# Patient Record
Sex: Female | Born: 1950 | Race: Black or African American | Hispanic: No
Health system: Southern US, Community
[De-identification: ages and names within clinical notes are randomized; demographics above are authoritative.]

---

## 1997-08-04 ENCOUNTER — Inpatient Hospital Stay (HOSPITAL_COMMUNITY): Admission: EM | Admit: 1997-08-04 | Discharge: 1997-08-07 | Payer: Self-pay | Admitting: Emergency Medicine

## 1997-08-08 ENCOUNTER — Other Ambulatory Visit: Admission: RE | Admit: 1997-08-08 | Discharge: 1997-08-08 | Payer: Self-pay | Admitting: Family Medicine

## 1999-12-13 ENCOUNTER — Encounter: Payer: Self-pay | Admitting: Emergency Medicine

## 1999-12-13 ENCOUNTER — Inpatient Hospital Stay (HOSPITAL_COMMUNITY): Admission: EM | Admit: 1999-12-13 | Discharge: 1999-12-30 | Payer: Self-pay | Admitting: Emergency Medicine

## 2000-01-02 ENCOUNTER — Encounter: Admission: RE | Admit: 2000-01-02 | Discharge: 2000-01-02 | Payer: Self-pay

## 2000-01-15 ENCOUNTER — Inpatient Hospital Stay (HOSPITAL_COMMUNITY): Admission: EM | Admit: 2000-01-15 | Discharge: 2000-01-17 | Payer: Self-pay | Admitting: Emergency Medicine

## 2000-01-15 ENCOUNTER — Encounter: Payer: Self-pay | Admitting: Internal Medicine

## 2000-01-15 ENCOUNTER — Encounter: Payer: Self-pay | Admitting: Emergency Medicine

## 2000-01-15 ENCOUNTER — Encounter: Payer: Self-pay | Admitting: *Deleted

## 2000-02-04 ENCOUNTER — Encounter: Admission: RE | Admit: 2000-02-04 | Discharge: 2000-02-04 | Payer: Self-pay | Admitting: Internal Medicine

## 2000-04-11 ENCOUNTER — Emergency Department (HOSPITAL_COMMUNITY): Admission: EM | Admit: 2000-04-11 | Discharge: 2000-04-11 | Payer: Self-pay | Admitting: Emergency Medicine

## 2000-05-26 ENCOUNTER — Inpatient Hospital Stay (HOSPITAL_COMMUNITY): Admission: AD | Admit: 2000-05-26 | Discharge: 2000-06-01 | Payer: Self-pay | Admitting: Internal Medicine

## 2000-05-26 ENCOUNTER — Encounter: Payer: Self-pay | Admitting: Internal Medicine

## 2000-05-26 ENCOUNTER — Encounter: Admission: RE | Admit: 2000-05-26 | Discharge: 2000-05-26 | Payer: Self-pay | Admitting: Internal Medicine

## 2000-05-27 ENCOUNTER — Encounter: Payer: Self-pay | Admitting: Internal Medicine

## 2000-05-28 ENCOUNTER — Encounter: Payer: Self-pay | Admitting: Internal Medicine

## 2000-05-28 ENCOUNTER — Encounter: Payer: Self-pay | Admitting: Cardiovascular Disease

## 2000-05-31 ENCOUNTER — Encounter: Payer: Self-pay | Admitting: Internal Medicine

## 2000-06-10 ENCOUNTER — Encounter: Admission: RE | Admit: 2000-06-10 | Discharge: 2000-06-10 | Payer: Self-pay

## 2000-06-15 ENCOUNTER — Encounter: Admission: RE | Admit: 2000-06-15 | Discharge: 2000-06-15 | Payer: Self-pay | Admitting: Hematology and Oncology

## 2000-06-27 ENCOUNTER — Encounter: Payer: Self-pay | Admitting: Emergency Medicine

## 2000-06-27 ENCOUNTER — Inpatient Hospital Stay (HOSPITAL_COMMUNITY): Admission: EM | Admit: 2000-06-27 | Discharge: 2000-06-29 | Payer: Self-pay | Admitting: Emergency Medicine

## 2000-06-28 ENCOUNTER — Encounter: Payer: Self-pay | Admitting: Internal Medicine

## 2000-07-30 ENCOUNTER — Encounter: Admission: RE | Admit: 2000-07-30 | Discharge: 2000-07-30 | Payer: Self-pay | Admitting: Internal Medicine

## 2000-09-06 ENCOUNTER — Encounter: Admission: RE | Admit: 2000-09-06 | Discharge: 2000-09-06 | Payer: Self-pay | Admitting: Internal Medicine

## 2000-12-17 ENCOUNTER — Encounter: Payer: Self-pay | Admitting: Emergency Medicine

## 2000-12-17 ENCOUNTER — Inpatient Hospital Stay (HOSPITAL_COMMUNITY): Admission: EM | Admit: 2000-12-17 | Discharge: 2000-12-24 | Payer: Self-pay | Admitting: Emergency Medicine

## 2001-01-04 ENCOUNTER — Encounter: Payer: Self-pay | Admitting: Emergency Medicine

## 2001-01-04 ENCOUNTER — Inpatient Hospital Stay (HOSPITAL_COMMUNITY): Admission: EM | Admit: 2001-01-04 | Discharge: 2001-01-09 | Payer: Self-pay | Admitting: Emergency Medicine

## 2001-01-06 ENCOUNTER — Encounter: Payer: Self-pay | Admitting: Internal Medicine

## 2001-01-07 ENCOUNTER — Encounter: Payer: Self-pay | Admitting: Internal Medicine

## 2001-01-12 ENCOUNTER — Encounter: Admission: RE | Admit: 2001-01-12 | Discharge: 2001-01-12 | Payer: Self-pay

## 2001-01-18 ENCOUNTER — Encounter: Admission: RE | Admit: 2001-01-18 | Discharge: 2001-01-18 | Payer: Self-pay | Admitting: Internal Medicine

## 2001-04-26 ENCOUNTER — Emergency Department (HOSPITAL_COMMUNITY): Admission: EM | Admit: 2001-04-26 | Discharge: 2001-04-27 | Payer: Self-pay | Admitting: Emergency Medicine

## 2001-05-27 ENCOUNTER — Encounter: Admission: RE | Admit: 2001-05-27 | Discharge: 2001-05-27 | Payer: Self-pay | Admitting: Internal Medicine

## 2001-06-20 ENCOUNTER — Encounter: Admission: RE | Admit: 2001-06-20 | Discharge: 2001-06-20 | Payer: Self-pay | Admitting: *Deleted

## 2001-07-15 ENCOUNTER — Encounter: Admission: RE | Admit: 2001-07-15 | Discharge: 2001-07-15 | Payer: Self-pay | Admitting: *Deleted

## 2001-09-06 ENCOUNTER — Emergency Department (HOSPITAL_COMMUNITY): Admission: EM | Admit: 2001-09-06 | Discharge: 2001-09-06 | Payer: Self-pay

## 2001-09-06 ENCOUNTER — Encounter: Payer: Self-pay | Admitting: Emergency Medicine

## 2001-11-09 ENCOUNTER — Encounter: Admission: RE | Admit: 2001-11-09 | Discharge: 2001-11-09 | Payer: Self-pay | Admitting: *Deleted

## 2001-12-29 ENCOUNTER — Encounter: Payer: Self-pay | Admitting: Vascular Surgery

## 2001-12-29 ENCOUNTER — Ambulatory Visit (HOSPITAL_COMMUNITY): Admission: RE | Admit: 2001-12-29 | Discharge: 2001-12-29 | Payer: Self-pay | Admitting: Vascular Surgery

## 2002-01-27 ENCOUNTER — Encounter: Payer: Self-pay | Admitting: Nephrology

## 2002-01-27 ENCOUNTER — Encounter: Admission: RE | Admit: 2002-01-27 | Discharge: 2002-01-27 | Payer: Self-pay | Admitting: Nephrology

## 2002-03-27 ENCOUNTER — Encounter: Admission: RE | Admit: 2002-03-27 | Discharge: 2002-03-27 | Payer: Self-pay | Admitting: *Deleted

## 2002-05-05 ENCOUNTER — Encounter: Admission: RE | Admit: 2002-05-05 | Discharge: 2002-05-05 | Payer: Self-pay | Admitting: *Deleted

## 2002-05-29 ENCOUNTER — Encounter: Admission: RE | Admit: 2002-05-29 | Discharge: 2002-05-29 | Payer: Self-pay | Admitting: *Deleted

## 2002-07-31 ENCOUNTER — Encounter: Admission: RE | Admit: 2002-07-31 | Discharge: 2002-07-31 | Payer: Self-pay | Admitting: Internal Medicine

## 2002-08-04 ENCOUNTER — Ambulatory Visit (HOSPITAL_COMMUNITY): Admission: RE | Admit: 2002-08-04 | Discharge: 2002-08-04 | Payer: Self-pay | Admitting: Internal Medicine

## 2002-08-07 ENCOUNTER — Encounter: Admission: RE | Admit: 2002-08-07 | Discharge: 2002-08-07 | Payer: Self-pay | Admitting: Internal Medicine

## 2002-10-27 ENCOUNTER — Ambulatory Visit (HOSPITAL_COMMUNITY): Admission: RE | Admit: 2002-10-27 | Discharge: 2002-10-27 | Payer: Self-pay | Admitting: Vascular Surgery

## 2002-10-27 ENCOUNTER — Encounter: Payer: Self-pay | Admitting: Vascular Surgery

## 2003-01-05 ENCOUNTER — Inpatient Hospital Stay (HOSPITAL_COMMUNITY): Admission: RE | Admit: 2003-01-05 | Discharge: 2003-01-15 | Payer: Self-pay | Admitting: Vascular Surgery

## 2003-01-05 ENCOUNTER — Encounter (INDEPENDENT_AMBULATORY_CARE_PROVIDER_SITE_OTHER): Payer: Self-pay | Admitting: Specialist

## 2003-01-11 ENCOUNTER — Encounter (INDEPENDENT_AMBULATORY_CARE_PROVIDER_SITE_OTHER): Payer: Self-pay | Admitting: Specialist

## 2003-01-15 ENCOUNTER — Inpatient Hospital Stay (HOSPITAL_COMMUNITY)
Admission: RE | Admit: 2003-01-15 | Discharge: 2003-01-30 | Payer: Self-pay | Admitting: Physical Medicine & Rehabilitation

## 2003-03-27 ENCOUNTER — Inpatient Hospital Stay (HOSPITAL_COMMUNITY): Admission: RE | Admit: 2003-03-27 | Discharge: 2003-04-05 | Payer: Self-pay | Admitting: Vascular Surgery

## 2003-04-05 ENCOUNTER — Inpatient Hospital Stay (HOSPITAL_COMMUNITY)
Admission: RE | Admit: 2003-04-05 | Discharge: 2003-04-13 | Payer: Self-pay | Admitting: Physical Medicine & Rehabilitation

## 2003-06-17 ENCOUNTER — Inpatient Hospital Stay (HOSPITAL_COMMUNITY): Admission: EM | Admit: 2003-06-17 | Discharge: 2003-06-20 | Payer: Self-pay | Admitting: Emergency Medicine

## 2003-07-07 ENCOUNTER — Inpatient Hospital Stay (HOSPITAL_COMMUNITY): Admission: EM | Admit: 2003-07-07 | Discharge: 2003-07-16 | Payer: Self-pay | Admitting: Emergency Medicine

## 2003-07-11 ENCOUNTER — Encounter (INDEPENDENT_AMBULATORY_CARE_PROVIDER_SITE_OTHER): Payer: Self-pay | Admitting: Specialist

## 2003-10-03 ENCOUNTER — Inpatient Hospital Stay (HOSPITAL_COMMUNITY): Admission: EM | Admit: 2003-10-03 | Discharge: 2003-10-08 | Payer: Self-pay | Admitting: Emergency Medicine

## 2003-10-31 ENCOUNTER — Ambulatory Visit (HOSPITAL_COMMUNITY): Payer: Self-pay | Admitting: Psychiatry

## 2004-01-02 ENCOUNTER — Ambulatory Visit (HOSPITAL_COMMUNITY): Payer: Self-pay | Admitting: Psychiatry

## 2004-03-31 ENCOUNTER — Ambulatory Visit (HOSPITAL_COMMUNITY): Payer: Self-pay | Admitting: Psychiatry

## 2004-05-19 ENCOUNTER — Ambulatory Visit (HOSPITAL_COMMUNITY): Payer: Self-pay | Admitting: Psychiatry

## 2004-06-23 ENCOUNTER — Ambulatory Visit (HOSPITAL_COMMUNITY): Payer: Self-pay | Admitting: Physician Assistant

## 2004-08-04 ENCOUNTER — Ambulatory Visit: Payer: Self-pay | Admitting: Psychiatry

## 2004-08-04 ENCOUNTER — Ambulatory Visit (HOSPITAL_COMMUNITY): Payer: Self-pay | Admitting: Psychiatry

## 2004-09-01 ENCOUNTER — Ambulatory Visit (HOSPITAL_COMMUNITY): Payer: Self-pay | Admitting: Psychiatry

## 2004-10-13 ENCOUNTER — Ambulatory Visit (HOSPITAL_COMMUNITY): Payer: Self-pay | Admitting: Psychiatry

## 2004-12-15 ENCOUNTER — Ambulatory Visit (HOSPITAL_COMMUNITY): Payer: Self-pay | Admitting: Psychiatry

## 2005-02-09 ENCOUNTER — Ambulatory Visit (HOSPITAL_COMMUNITY): Admission: RE | Admit: 2005-02-09 | Discharge: 2005-02-09 | Payer: Self-pay | Admitting: Nephrology

## 2005-03-04 ENCOUNTER — Ambulatory Visit (HOSPITAL_COMMUNITY): Admission: RE | Admit: 2005-03-04 | Discharge: 2005-03-04 | Payer: Self-pay | Admitting: Nephrology

## 2005-03-16 ENCOUNTER — Emergency Department (HOSPITAL_COMMUNITY): Admission: EM | Admit: 2005-03-16 | Discharge: 2005-03-16 | Payer: Self-pay | Admitting: Emergency Medicine

## 2005-03-18 ENCOUNTER — Ambulatory Visit (HOSPITAL_COMMUNITY): Payer: Self-pay | Admitting: Psychiatry

## 2005-05-13 ENCOUNTER — Ambulatory Visit (HOSPITAL_COMMUNITY): Payer: Self-pay | Admitting: Psychiatry

## 2005-05-29 ENCOUNTER — Encounter: Admission: RE | Admit: 2005-05-29 | Discharge: 2005-05-29 | Payer: Self-pay | Admitting: Nephrology

## 2005-08-10 ENCOUNTER — Ambulatory Visit (HOSPITAL_COMMUNITY): Payer: Self-pay | Admitting: Psychiatry

## 2005-10-14 ENCOUNTER — Ambulatory Visit (HOSPITAL_COMMUNITY): Payer: Self-pay | Admitting: Psychiatry

## 2005-12-02 ENCOUNTER — Encounter: Admission: RE | Admit: 2005-12-02 | Discharge: 2005-12-02 | Payer: Self-pay | Admitting: Nephrology

## 2006-01-01 ENCOUNTER — Ambulatory Visit (HOSPITAL_COMMUNITY): Payer: Self-pay | Admitting: Psychiatry

## 2006-02-11 ENCOUNTER — Emergency Department (HOSPITAL_COMMUNITY): Admission: EM | Admit: 2006-02-11 | Discharge: 2006-02-11 | Payer: Self-pay | Admitting: Emergency Medicine

## 2006-02-12 ENCOUNTER — Emergency Department (HOSPITAL_COMMUNITY): Admission: EM | Admit: 2006-02-12 | Discharge: 2006-02-12 | Payer: Self-pay | Admitting: Emergency Medicine

## 2006-02-13 ENCOUNTER — Inpatient Hospital Stay (HOSPITAL_COMMUNITY): Admission: EM | Admit: 2006-02-13 | Discharge: 2006-02-18 | Payer: Self-pay | Admitting: Emergency Medicine

## 2006-03-05 ENCOUNTER — Emergency Department (HOSPITAL_COMMUNITY): Admission: EM | Admit: 2006-03-05 | Discharge: 2006-03-05 | Payer: Self-pay | Admitting: Emergency Medicine

## 2006-03-26 ENCOUNTER — Ambulatory Visit: Payer: Self-pay | Admitting: Internal Medicine

## 2006-04-02 ENCOUNTER — Ambulatory Visit (HOSPITAL_COMMUNITY): Payer: Self-pay | Admitting: Psychiatry

## 2006-04-06 ENCOUNTER — Inpatient Hospital Stay (HOSPITAL_COMMUNITY): Admission: EM | Admit: 2006-04-06 | Discharge: 2006-04-09 | Payer: Self-pay | Admitting: Emergency Medicine

## 2006-05-12 ENCOUNTER — Ambulatory Visit: Payer: Self-pay | Admitting: Vascular Surgery

## 2006-06-02 ENCOUNTER — Ambulatory Visit: Payer: Self-pay | Admitting: Vascular Surgery

## 2006-07-02 ENCOUNTER — Ambulatory Visit (HOSPITAL_COMMUNITY): Payer: Self-pay | Admitting: Psychiatry

## 2006-07-14 ENCOUNTER — Ambulatory Visit: Payer: Self-pay | Admitting: Vascular Surgery

## 2006-08-24 ENCOUNTER — Ambulatory Visit: Payer: Self-pay | Admitting: Internal Medicine

## 2006-08-24 ENCOUNTER — Inpatient Hospital Stay (HOSPITAL_COMMUNITY): Admission: EM | Admit: 2006-08-24 | Discharge: 2006-08-31 | Payer: Self-pay | Admitting: Emergency Medicine

## 2006-08-24 ENCOUNTER — Ambulatory Visit: Payer: Self-pay | Admitting: Cardiology

## 2006-08-25 ENCOUNTER — Encounter (INDEPENDENT_AMBULATORY_CARE_PROVIDER_SITE_OTHER): Payer: Self-pay | Admitting: Nephrology

## 2006-09-15 ENCOUNTER — Ambulatory Visit: Payer: Self-pay | Admitting: Vascular Surgery

## 2006-10-06 ENCOUNTER — Emergency Department (HOSPITAL_COMMUNITY): Admission: EM | Admit: 2006-10-06 | Discharge: 2006-10-06 | Payer: Self-pay | Admitting: Emergency Medicine

## 2006-10-06 ENCOUNTER — Emergency Department (HOSPITAL_COMMUNITY): Admission: EM | Admit: 2006-10-06 | Discharge: 2006-10-06 | Payer: Self-pay | Admitting: *Deleted

## 2006-10-12 ENCOUNTER — Inpatient Hospital Stay (HOSPITAL_COMMUNITY): Admission: EM | Admit: 2006-10-12 | Discharge: 2006-10-22 | Payer: Self-pay | Admitting: Emergency Medicine

## 2006-10-24 ENCOUNTER — Emergency Department (HOSPITAL_COMMUNITY): Admission: EM | Admit: 2006-10-24 | Discharge: 2006-10-24 | Payer: Self-pay | Admitting: Emergency Medicine

## 2006-11-02 ENCOUNTER — Inpatient Hospital Stay (HOSPITAL_COMMUNITY): Admission: EM | Admit: 2006-11-02 | Discharge: 2006-11-09 | Payer: Self-pay | Admitting: Emergency Medicine

## 2006-11-03 ENCOUNTER — Ambulatory Visit: Payer: Self-pay | Admitting: Vascular Surgery

## 2006-11-04 ENCOUNTER — Encounter (INDEPENDENT_AMBULATORY_CARE_PROVIDER_SITE_OTHER): Payer: Self-pay | Admitting: Nephrology

## 2006-12-13 ENCOUNTER — Emergency Department (HOSPITAL_COMMUNITY): Admission: EM | Admit: 2006-12-13 | Discharge: 2006-12-14 | Payer: Self-pay | Admitting: Emergency Medicine

## 2006-12-14 ENCOUNTER — Ambulatory Visit: Payer: Self-pay | Admitting: Vascular Surgery

## 2006-12-14 ENCOUNTER — Inpatient Hospital Stay (HOSPITAL_COMMUNITY): Admission: AD | Admit: 2006-12-14 | Discharge: 2006-12-24 | Payer: Self-pay | Admitting: Nephrology

## 2006-12-20 ENCOUNTER — Ambulatory Visit: Payer: Self-pay | Admitting: Vascular Surgery

## 2006-12-20 ENCOUNTER — Encounter: Payer: Self-pay | Admitting: Vascular Surgery

## 2006-12-21 ENCOUNTER — Encounter (INDEPENDENT_AMBULATORY_CARE_PROVIDER_SITE_OTHER): Payer: Self-pay | Admitting: Nephrology

## 2007-01-31 ENCOUNTER — Inpatient Hospital Stay (HOSPITAL_COMMUNITY): Admission: EM | Admit: 2007-01-31 | Discharge: 2007-02-23 | Payer: Self-pay | Admitting: Emergency Medicine

## 2007-03-04 ENCOUNTER — Ambulatory Visit: Payer: Self-pay | Admitting: Vascular Surgery

## 2007-03-04 ENCOUNTER — Ambulatory Visit (HOSPITAL_COMMUNITY): Admission: RE | Admit: 2007-03-04 | Discharge: 2007-03-04 | Payer: Self-pay | Admitting: Vascular Surgery

## 2007-03-31 ENCOUNTER — Ambulatory Visit (HOSPITAL_COMMUNITY): Admission: RE | Admit: 2007-03-31 | Discharge: 2007-03-31 | Payer: Self-pay | Admitting: Vascular Surgery

## 2007-04-08 ENCOUNTER — Inpatient Hospital Stay (HOSPITAL_COMMUNITY): Admission: EM | Admit: 2007-04-08 | Discharge: 2007-04-11 | Payer: Self-pay | Admitting: Emergency Medicine

## 2007-04-08 ENCOUNTER — Ambulatory Visit: Payer: Self-pay | Admitting: Vascular Surgery

## 2007-04-09 ENCOUNTER — Other Ambulatory Visit: Payer: Self-pay | Admitting: Internal Medicine

## 2007-04-11 ENCOUNTER — Other Ambulatory Visit: Payer: Self-pay | Admitting: Internal Medicine

## 2007-04-15 ENCOUNTER — Ambulatory Visit: Payer: Self-pay | Admitting: Vascular Surgery

## 2007-05-03 ENCOUNTER — Inpatient Hospital Stay (HOSPITAL_COMMUNITY): Admission: EM | Admit: 2007-05-03 | Discharge: 2007-05-05 | Payer: Self-pay | Admitting: Emergency Medicine

## 2007-05-04 ENCOUNTER — Encounter (INDEPENDENT_AMBULATORY_CARE_PROVIDER_SITE_OTHER): Payer: Self-pay | Admitting: Nephrology

## 2007-05-04 ENCOUNTER — Ambulatory Visit: Payer: Self-pay | Admitting: Vascular Surgery

## 2007-05-10 ENCOUNTER — Ambulatory Visit: Payer: Self-pay | Admitting: Vascular Surgery

## 2007-05-16 ENCOUNTER — Ambulatory Visit (HOSPITAL_COMMUNITY): Admission: RE | Admit: 2007-05-16 | Discharge: 2007-05-16 | Payer: Self-pay | Admitting: Vascular Surgery

## 2007-06-14 ENCOUNTER — Ambulatory Visit: Payer: Self-pay | Admitting: Vascular Surgery

## 2007-07-05 ENCOUNTER — Ambulatory Visit: Payer: Self-pay | Admitting: Vascular Surgery

## 2007-07-08 ENCOUNTER — Encounter: Payer: Self-pay | Admitting: Vascular Surgery

## 2007-07-08 ENCOUNTER — Ambulatory Visit: Payer: Self-pay | Admitting: Vascular Surgery

## 2007-07-08 ENCOUNTER — Ambulatory Visit (HOSPITAL_COMMUNITY): Admission: RE | Admit: 2007-07-08 | Discharge: 2007-07-09 | Payer: Self-pay | Admitting: Vascular Surgery

## 2007-07-19 ENCOUNTER — Ambulatory Visit: Payer: Self-pay | Admitting: Vascular Surgery

## 2007-07-26 ENCOUNTER — Encounter
Admission: RE | Admit: 2007-07-26 | Discharge: 2007-07-27 | Payer: Self-pay | Admitting: Physical Medicine & Rehabilitation

## 2007-07-27 ENCOUNTER — Ambulatory Visit: Payer: Self-pay | Admitting: Physical Medicine & Rehabilitation

## 2007-07-27 ENCOUNTER — Encounter
Admission: RE | Admit: 2007-07-27 | Discharge: 2007-10-25 | Payer: Self-pay | Admitting: Physical Medicine & Rehabilitation

## 2007-08-09 ENCOUNTER — Ambulatory Visit: Payer: Self-pay | Admitting: Vascular Surgery

## 2007-10-05 ENCOUNTER — Encounter: Admission: RE | Admit: 2007-10-05 | Discharge: 2008-01-03 | Payer: Self-pay | Admitting: Nephrology

## 2007-11-08 ENCOUNTER — Ambulatory Visit: Payer: Self-pay | Admitting: Vascular Surgery

## 2007-12-07 ENCOUNTER — Ambulatory Visit: Payer: Self-pay | Admitting: Vascular Surgery

## 2007-12-08 ENCOUNTER — Inpatient Hospital Stay (HOSPITAL_COMMUNITY): Admission: EM | Admit: 2007-12-08 | Discharge: 2007-12-21 | Payer: Self-pay | Admitting: Emergency Medicine

## 2007-12-10 ENCOUNTER — Ambulatory Visit: Payer: Self-pay | Admitting: Internal Medicine

## 2007-12-13 ENCOUNTER — Ambulatory Visit: Payer: Self-pay | Admitting: Vascular Surgery

## 2007-12-14 ENCOUNTER — Encounter: Payer: Self-pay | Admitting: Infectious Diseases

## 2007-12-16 ENCOUNTER — Encounter: Payer: Self-pay | Admitting: Vascular Surgery

## 2007-12-24 ENCOUNTER — Inpatient Hospital Stay (HOSPITAL_COMMUNITY): Admission: EM | Admit: 2007-12-24 | Discharge: 2008-01-04 | Payer: Self-pay | Admitting: Emergency Medicine

## 2007-12-24 ENCOUNTER — Ambulatory Visit: Payer: Self-pay | Admitting: Internal Medicine

## 2007-12-28 ENCOUNTER — Ambulatory Visit: Payer: Self-pay | Admitting: Thoracic Surgery (Cardiothoracic Vascular Surgery)

## 2008-01-08 ENCOUNTER — Inpatient Hospital Stay (HOSPITAL_COMMUNITY): Admission: EM | Admit: 2008-01-08 | Discharge: 2008-01-10 | Payer: Self-pay | Admitting: Emergency Medicine

## 2008-03-31 ENCOUNTER — Inpatient Hospital Stay (HOSPITAL_COMMUNITY): Admission: EM | Admit: 2008-03-31 | Discharge: 2008-04-08 | Payer: Self-pay | Admitting: Emergency Medicine

## 2008-04-02 ENCOUNTER — Ambulatory Visit: Payer: Self-pay | Admitting: Infectious Diseases

## 2008-04-09 ENCOUNTER — Inpatient Hospital Stay (HOSPITAL_COMMUNITY): Admission: EM | Admit: 2008-04-09 | Discharge: 2008-04-12 | Payer: Self-pay | Admitting: Emergency Medicine

## 2008-05-04 ENCOUNTER — Inpatient Hospital Stay (HOSPITAL_COMMUNITY): Admission: EM | Admit: 2008-05-04 | Discharge: 2008-05-06 | Payer: Self-pay | Admitting: Emergency Medicine

## 2008-05-25 ENCOUNTER — Encounter (INDEPENDENT_AMBULATORY_CARE_PROVIDER_SITE_OTHER): Payer: Self-pay | Admitting: *Deleted

## 2008-05-26 ENCOUNTER — Inpatient Hospital Stay (HOSPITAL_COMMUNITY): Admission: EM | Admit: 2008-05-26 | Discharge: 2008-05-31 | Payer: Self-pay | Admitting: Emergency Medicine

## 2008-05-31 ENCOUNTER — Encounter (INDEPENDENT_AMBULATORY_CARE_PROVIDER_SITE_OTHER): Payer: Self-pay | Admitting: *Deleted

## 2008-06-19 ENCOUNTER — Emergency Department (HOSPITAL_COMMUNITY): Admission: EM | Admit: 2008-06-19 | Discharge: 2008-06-19 | Payer: Self-pay | Admitting: *Deleted

## 2008-07-17 ENCOUNTER — Ambulatory Visit: Payer: Self-pay | Admitting: Cardiovascular Disease

## 2008-07-17 DIAGNOSIS — J449 Chronic obstructive pulmonary disease, unspecified: Secondary | ICD-10-CM

## 2008-07-17 DIAGNOSIS — G4730 Sleep apnea, unspecified: Secondary | ICD-10-CM | POA: Insufficient documentation

## 2008-07-17 DIAGNOSIS — I10 Essential (primary) hypertension: Secondary | ICD-10-CM | POA: Insufficient documentation

## 2008-07-17 DIAGNOSIS — E119 Type 2 diabetes mellitus without complications: Secondary | ICD-10-CM | POA: Insufficient documentation

## 2008-07-17 DIAGNOSIS — I2510 Atherosclerotic heart disease of native coronary artery without angina pectoris: Secondary | ICD-10-CM | POA: Insufficient documentation

## 2008-07-17 DIAGNOSIS — Z8719 Personal history of other diseases of the digestive system: Secondary | ICD-10-CM | POA: Insufficient documentation

## 2008-07-17 DIAGNOSIS — K3184 Gastroparesis: Secondary | ICD-10-CM

## 2008-07-17 DIAGNOSIS — J4489 Other specified chronic obstructive pulmonary disease: Secondary | ICD-10-CM | POA: Insufficient documentation

## 2008-07-17 DIAGNOSIS — F329 Major depressive disorder, single episode, unspecified: Secondary | ICD-10-CM

## 2008-07-18 ENCOUNTER — Inpatient Hospital Stay (HOSPITAL_COMMUNITY): Admission: EM | Admit: 2008-07-18 | Discharge: 2008-07-30 | Payer: Self-pay | Admitting: Emergency Medicine

## 2008-08-15 ENCOUNTER — Encounter: Admission: RE | Admit: 2008-08-15 | Discharge: 2008-08-15 | Payer: Self-pay | Admitting: Nephrology

## 2008-08-16 ENCOUNTER — Emergency Department (HOSPITAL_COMMUNITY): Admission: EM | Admit: 2008-08-16 | Discharge: 2008-08-17 | Payer: Self-pay | Admitting: Emergency Medicine

## 2008-09-04 ENCOUNTER — Emergency Department (HOSPITAL_COMMUNITY): Admission: EM | Admit: 2008-09-04 | Discharge: 2008-09-05 | Payer: Self-pay | Admitting: Emergency Medicine

## 2008-09-18 ENCOUNTER — Ambulatory Visit: Payer: Self-pay | Admitting: Vascular Surgery

## 2008-10-24 ENCOUNTER — Inpatient Hospital Stay (HOSPITAL_COMMUNITY): Admission: EM | Admit: 2008-10-24 | Discharge: 2008-10-31 | Payer: Self-pay | Admitting: Emergency Medicine

## 2008-10-24 ENCOUNTER — Ambulatory Visit: Payer: Self-pay | Admitting: Gastroenterology

## 2008-10-29 ENCOUNTER — Encounter: Payer: Self-pay | Admitting: Gastroenterology

## 2009-02-14 ENCOUNTER — Ambulatory Visit: Payer: Self-pay | Admitting: Vascular Surgery

## 2009-03-01 ENCOUNTER — Encounter: Payer: Self-pay | Admitting: Vascular Surgery

## 2009-03-01 ENCOUNTER — Ambulatory Visit: Payer: Self-pay | Admitting: Vascular Surgery

## 2009-03-01 ENCOUNTER — Inpatient Hospital Stay (HOSPITAL_COMMUNITY): Admission: RE | Admit: 2009-03-01 | Discharge: 2009-03-09 | Payer: Self-pay | Admitting: Vascular Surgery

## 2009-03-28 ENCOUNTER — Ambulatory Visit: Payer: Self-pay | Admitting: Vascular Surgery

## 2009-04-05 ENCOUNTER — Ambulatory Visit: Payer: Self-pay | Admitting: Vascular Surgery

## 2009-04-05 ENCOUNTER — Encounter: Payer: Self-pay | Admitting: Vascular Surgery

## 2009-04-05 ENCOUNTER — Inpatient Hospital Stay (HOSPITAL_COMMUNITY): Admission: RE | Admit: 2009-04-05 | Discharge: 2009-04-18 | Payer: Self-pay | Admitting: Vascular Surgery

## 2009-04-09 ENCOUNTER — Ambulatory Visit: Payer: Self-pay | Admitting: Physical Medicine & Rehabilitation

## 2009-05-16 ENCOUNTER — Ambulatory Visit: Payer: Self-pay | Admitting: Vascular Surgery

## 2009-09-20 ENCOUNTER — Ambulatory Visit (HOSPITAL_COMMUNITY): Admission: RE | Admit: 2009-09-20 | Discharge: 2009-09-20 | Payer: Self-pay | Admitting: Nephrology

## 2009-10-10 ENCOUNTER — Emergency Department (HOSPITAL_COMMUNITY): Admission: EM | Admit: 2009-10-10 | Discharge: 2009-10-10 | Payer: Self-pay | Admitting: Emergency Medicine

## 2010-03-16 ENCOUNTER — Encounter: Payer: Self-pay | Admitting: Nephrology

## 2010-03-26 DEATH — deceased

## 2010-05-10 LAB — CBC
HCT: 33.2 % — ABNORMAL LOW (ref 36.0–46.0)
HCT: 34.3 % — ABNORMAL LOW (ref 36.0–46.0)
HCT: 36.9 % (ref 36.0–46.0)
Hemoglobin: 12.9 g/dL (ref 12.0–15.0)
Hemoglobin: 10.8 g/dL — ABNORMAL LOW (ref 12.0–15.0)
Hemoglobin: 11 g/dL — ABNORMAL LOW (ref 12.0–15.0)
Hemoglobin: 12.1 g/dL (ref 12.0–15.0)
MCHC: 31.7 g/dL (ref 30.0–36.0)
MCHC: 32.1 g/dL (ref 30.0–36.0)
MCHC: 32.2 g/dL (ref 30.0–36.0)
MCV: 95.4 fL (ref 78.0–100.0)
MCV: 95.5 fL (ref 78.0–100.0)
Platelets: 174 10*3/uL (ref 150–400)
Platelets: 175 10*3/uL (ref 150–400)
Platelets: 192 10*3/uL (ref 150–400)
Platelets: 208 10*3/uL (ref 150–400)
RBC: 3.94 MIL/uL (ref 3.87–5.11)
RBC: 3.53 MIL/uL — ABNORMAL LOW (ref 3.87–5.11)
RDW: 17 % — ABNORMAL HIGH (ref 11.5–15.5)
RDW: 17.4 % — ABNORMAL HIGH (ref 11.5–15.5)
RDW: 17.5 % — ABNORMAL HIGH (ref 11.5–15.5)
RDW: 17.7 % — ABNORMAL HIGH (ref 11.5–15.5)
WBC: 6.6 10*3/uL (ref 4.0–10.5)
WBC: 8.5 10*3/uL (ref 4.0–10.5)
WBC: 8.8 10*3/uL (ref 4.0–10.5)
WBC: 9.1 10*3/uL (ref 4.0–10.5)

## 2010-05-10 LAB — RENAL FUNCTION PANEL
Albumin: 2.4 g/dL — ABNORMAL LOW (ref 3.5–5.2)
Albumin: 2.4 g/dL — ABNORMAL LOW (ref 3.5–5.2)
Albumin: 2.9 g/dL — ABNORMAL LOW (ref 3.5–5.2)
BUN: 101 mg/dL — ABNORMAL HIGH (ref 6–23)
BUN: 83 mg/dL — ABNORMAL HIGH (ref 6–23)
BUN: 90 mg/dL — ABNORMAL HIGH (ref 6–23)
CO2: 23 mEq/L (ref 19–32)
Calcium: 8.9 mg/dL (ref 8.4–10.5)
Calcium: 9 mg/dL (ref 8.4–10.5)
Chloride: 92 mEq/L — ABNORMAL LOW (ref 96–112)
Chloride: 95 mEq/L — ABNORMAL LOW (ref 96–112)
Chloride: 95 mEq/L — ABNORMAL LOW (ref 96–112)
Creatinine, Ser: 11.9 mg/dL — ABNORMAL HIGH (ref 0.4–1.2)
Creatinine, Ser: 12.29 mg/dL — ABNORMAL HIGH (ref 0.4–1.2)
GFR calc Af Amer: 5 mL/min — ABNORMAL LOW (ref >60)
GFR calc Af Amer: 6 mL/min — ABNORMAL LOW (ref >60)
GFR calc non Af Amer: 4 mL/min — ABNORMAL LOW (ref >60)
GFR calc non Af Amer: 5 mL/min — ABNORMAL LOW (ref >60)
Glucose, Bld: 140 mg/dL — ABNORMAL HIGH (ref 70–99)
Phosphorus: 9.2 mg/dL (ref 2.3–4.6)
Phosphorus: 9.8 mg/dL (ref 2.3–4.6)
Potassium: 4.5 mEq/L (ref 3.5–5.1)
Potassium: 6.3 mEq/L (ref 3.5–5.1)

## 2010-05-10 LAB — GLUCOSE, CAPILLARY
Glucose-Capillary: 101 mg/dL — ABNORMAL HIGH (ref 70–99)
Glucose-Capillary: 113 mg/dL — ABNORMAL HIGH (ref 70–99)
Glucose-Capillary: 137 mg/dL — ABNORMAL HIGH (ref 70–99)
Glucose-Capillary: 137 mg/dL — ABNORMAL HIGH (ref 70–99)
Glucose-Capillary: 140 mg/dL — ABNORMAL HIGH (ref 70–99)
Glucose-Capillary: 154 mg/dL — ABNORMAL HIGH (ref 70–99)
Glucose-Capillary: 157 mg/dL — ABNORMAL HIGH (ref 70–99)
Glucose-Capillary: 158 mg/dL — ABNORMAL HIGH (ref 70–99)
Glucose-Capillary: 173 mg/dL — ABNORMAL HIGH (ref 70–99)
Glucose-Capillary: 184 mg/dL — ABNORMAL HIGH (ref 70–99)
Glucose-Capillary: 185 mg/dL — ABNORMAL HIGH (ref 70–99)
Glucose-Capillary: 187 mg/dL — ABNORMAL HIGH (ref 70–99)
Glucose-Capillary: 202 mg/dL — ABNORMAL HIGH (ref 70–99)
Glucose-Capillary: 228 mg/dL — ABNORMAL HIGH (ref 70–99)
Glucose-Capillary: 73 mg/dL (ref 70–99)
Glucose-Capillary: 79 mg/dL (ref 70–99)

## 2010-05-10 LAB — BASIC METABOLIC PANEL
BUN: 80 mg/dL — ABNORMAL HIGH (ref 6–23)
CO2: 23 mEq/L (ref 19–32)
Calcium: 8.8 mg/dL (ref 8.4–10.5)
Creatinine, Ser: 9.18 mg/dL — ABNORMAL HIGH (ref 0.4–1.2)
GFR calc non Af Amer: 4 mL/min — ABNORMAL LOW (ref >60)
Glucose, Bld: 88 mg/dL (ref 70–99)
Sodium: 133 mEq/L — ABNORMAL LOW (ref 135–145)

## 2010-05-10 LAB — POCT I-STAT 4, (NA,K, GLUC, HGB,HCT)
Glucose, Bld: 80 mg/dL (ref 70–99)
HCT: 45 % (ref 36.0–46.0)
Hemoglobin: 15.3 g/dL — ABNORMAL HIGH (ref 12.0–15.0)
Potassium: 5.2 meq/L — ABNORMAL HIGH (ref 3.5–5.1)
Sodium: 140 meq/L (ref 135–145)

## 2010-05-10 LAB — AST: AST: 12 U/L (ref 0–37)

## 2010-05-10 LAB — HEMOGLOBIN A1C
Hgb A1c MFr Bld: 5.5 % (ref 4.6–6.1)
Mean Plasma Glucose: 111 mg/dL

## 2010-05-10 LAB — PROTIME-INR
INR: 1.02 (ref 0.00–1.49)
Prothrombin Time: 13.3 seconds (ref 11.6–15.2)

## 2010-05-10 LAB — ALT: ALT: 13 U/L (ref 0–35)

## 2010-05-14 LAB — COMPREHENSIVE METABOLIC PANEL
ALT: 16 U/L (ref 0–35)
AST: 62 U/L — ABNORMAL HIGH (ref 0–37)
Albumin: 2.7 g/dL — ABNORMAL LOW (ref 3.5–5.2)
CO2: 22 mEq/L (ref 19–32)
Calcium: 9.1 mg/dL (ref 8.4–10.5)
Chloride: 100 mEq/L (ref 96–112)
Creatinine, Ser: 6.53 mg/dL — ABNORMAL HIGH (ref 0.4–1.2)
Creatinine, Ser: 7.73 mg/dL — ABNORMAL HIGH (ref 0.4–1.2)
GFR calc Af Amer: 8 mL/min — ABNORMAL LOW (ref >60)
GFR calc non Af Amer: 5 mL/min — ABNORMAL LOW (ref >60)
Glucose, Bld: 211 mg/dL — ABNORMAL HIGH (ref 70–99)
Sodium: 130 mEq/L — ABNORMAL LOW (ref 135–145)
Total Bilirubin: 1 mg/dL (ref 0.3–1.2)
Total Protein: 7.4 g/dL (ref 6.0–8.3)

## 2010-05-14 LAB — GLUCOSE, CAPILLARY
Glucose-Capillary: 106 mg/dL — ABNORMAL HIGH (ref 70–99)
Glucose-Capillary: 112 mg/dL — ABNORMAL HIGH (ref 70–99)
Glucose-Capillary: 116 mg/dL — ABNORMAL HIGH (ref 70–99)
Glucose-Capillary: 120 mg/dL — ABNORMAL HIGH (ref 70–99)
Glucose-Capillary: 123 mg/dL — ABNORMAL HIGH (ref 70–99)
Glucose-Capillary: 130 mg/dL — ABNORMAL HIGH (ref 70–99)
Glucose-Capillary: 133 mg/dL — ABNORMAL HIGH (ref 70–99)
Glucose-Capillary: 140 mg/dL — ABNORMAL HIGH (ref 70–99)
Glucose-Capillary: 149 mg/dL — ABNORMAL HIGH (ref 70–99)
Glucose-Capillary: 153 mg/dL — ABNORMAL HIGH (ref 70–99)
Glucose-Capillary: 156 mg/dL — ABNORMAL HIGH (ref 70–99)
Glucose-Capillary: 163 mg/dL — ABNORMAL HIGH (ref 70–99)
Glucose-Capillary: 166 mg/dL — ABNORMAL HIGH (ref 70–99)
Glucose-Capillary: 171 mg/dL — ABNORMAL HIGH (ref 70–99)
Glucose-Capillary: 173 mg/dL — ABNORMAL HIGH (ref 70–99)
Glucose-Capillary: 178 mg/dL — ABNORMAL HIGH (ref 70–99)
Glucose-Capillary: 179 mg/dL — ABNORMAL HIGH (ref 70–99)
Glucose-Capillary: 181 mg/dL — ABNORMAL HIGH (ref 70–99)
Glucose-Capillary: 188 mg/dL — ABNORMAL HIGH (ref 70–99)
Glucose-Capillary: 202 mg/dL — ABNORMAL HIGH (ref 70–99)
Glucose-Capillary: 207 mg/dL — ABNORMAL HIGH (ref 70–99)
Glucose-Capillary: 213 mg/dL — ABNORMAL HIGH (ref 70–99)
Glucose-Capillary: 215 mg/dL — ABNORMAL HIGH (ref 70–99)
Glucose-Capillary: 220 mg/dL — ABNORMAL HIGH (ref 70–99)
Glucose-Capillary: 221 mg/dL — ABNORMAL HIGH (ref 70–99)
Glucose-Capillary: 230 mg/dL — ABNORMAL HIGH (ref 70–99)
Glucose-Capillary: 253 mg/dL — ABNORMAL HIGH (ref 70–99)
Glucose-Capillary: 73 mg/dL (ref 70–99)
Glucose-Capillary: 75 mg/dL (ref 70–99)

## 2010-05-14 LAB — CBC
HCT: 25.5 % — ABNORMAL LOW (ref 36.0–46.0)
HCT: 26.5 % — ABNORMAL LOW (ref 36.0–46.0)
HCT: 33.8 % — ABNORMAL LOW (ref 36.0–46.0)
Hemoglobin: 8.1 g/dL — ABNORMAL LOW (ref 12.0–15.0)
Hemoglobin: 8.1 g/dL — ABNORMAL LOW (ref 12.0–15.0)
Hemoglobin: 8.3 g/dL — ABNORMAL LOW (ref 12.0–15.0)
Hemoglobin: 8.5 g/dL — ABNORMAL LOW (ref 12.0–15.0)
Hemoglobin: 8.5 g/dL — ABNORMAL LOW (ref 12.0–15.0)
MCHC: 31.7 g/dL (ref 30.0–36.0)
MCHC: 31.9 g/dL (ref 30.0–36.0)
MCHC: 31.9 g/dL (ref 30.0–36.0)
MCHC: 32.1 g/dL (ref 30.0–36.0)
MCHC: 32.2 g/dL (ref 30.0–36.0)
MCHC: 32.5 g/dL (ref 30.0–36.0)
MCHC: 32.6 g/dL (ref 30.0–36.0)
MCV: 94 fL (ref 78.0–100.0)
MCV: 94 fL (ref 78.0–100.0)
MCV: 94.3 fL (ref 78.0–100.0)
MCV: 94.3 fL (ref 78.0–100.0)
MCV: 95.3 fL (ref 78.0–100.0)
MCV: 98.1 fL (ref 78.0–100.0)
Platelets: 235 10*3/uL (ref 150–400)
Platelets: 263 10*3/uL (ref 150–400)
Platelets: 266 10*3/uL (ref 150–400)
Platelets: 279 10*3/uL (ref 150–400)
Platelets: 283 10*3/uL (ref 150–400)
Platelets: 295 10*3/uL (ref 150–400)
Platelets: 341 10*3/uL (ref 150–400)
RBC: 2.7 MIL/uL — ABNORMAL LOW (ref 3.87–5.11)
RBC: 2.7 MIL/uL — ABNORMAL LOW (ref 3.87–5.11)
RBC: 2.79 MIL/uL — ABNORMAL LOW (ref 3.87–5.11)
RBC: 2.82 MIL/uL — ABNORMAL LOW (ref 3.87–5.11)
RBC: 2.83 MIL/uL — ABNORMAL LOW (ref 3.87–5.11)
RDW: 18.5 % — ABNORMAL HIGH (ref 11.5–15.5)
RDW: 18.8 % — ABNORMAL HIGH (ref 11.5–15.5)
RDW: 18.8 % — ABNORMAL HIGH (ref 11.5–15.5)
RDW: 19 % — ABNORMAL HIGH (ref 11.5–15.5)
RDW: 19.2 % — ABNORMAL HIGH (ref 11.5–15.5)
RDW: 19.4 % — ABNORMAL HIGH (ref 11.5–15.5)
RDW: 19.4 % — ABNORMAL HIGH (ref 11.5–15.5)
RDW: 19.5 % — ABNORMAL HIGH (ref 11.5–15.5)
RDW: 19.7 % — ABNORMAL HIGH (ref 11.5–15.5)
WBC: 12.6 10*3/uL — ABNORMAL HIGH (ref 4.0–10.5)
WBC: 14.8 10*3/uL — ABNORMAL HIGH (ref 4.0–10.5)
WBC: 15.2 10*3/uL — ABNORMAL HIGH (ref 4.0–10.5)
WBC: 15.8 10*3/uL — ABNORMAL HIGH (ref 4.0–10.5)

## 2010-05-14 LAB — CARDIAC PANEL(CRET KIN+CKTOT+MB+TROPI)
CK, MB: 1.5 ng/mL (ref 0.3–4.0)
Total CK: 133 U/L (ref 7–177)
Total CK: 95 U/L (ref 7–177)
Troponin I: 1.13 ng/mL (ref 0.00–0.06)

## 2010-05-14 LAB — BASIC METABOLIC PANEL
BUN: 57 mg/dL — ABNORMAL HIGH (ref 6–23)
CO2: 25 mEq/L (ref 19–32)
Calcium: 8.8 mg/dL (ref 8.4–10.5)
Creatinine, Ser: 7.4 mg/dL — ABNORMAL HIGH (ref 0.4–1.2)
GFR calc Af Amer: 7 mL/min — ABNORMAL LOW (ref >60)
Glucose, Bld: 161 mg/dL — ABNORMAL HIGH (ref 70–99)

## 2010-05-14 LAB — RENAL FUNCTION PANEL
Albumin: 2.5 g/dL — ABNORMAL LOW (ref 3.5–5.2)
Albumin: 2.6 g/dL — ABNORMAL LOW (ref 3.5–5.2)
Albumin: 2.7 g/dL — ABNORMAL LOW (ref 3.5–5.2)
Albumin: 2.8 g/dL — ABNORMAL LOW (ref 3.5–5.2)
BUN: 49 mg/dL — ABNORMAL HIGH (ref 6–23)
BUN: 69 mg/dL — ABNORMAL HIGH (ref 6–23)
CO2: 20 mEq/L (ref 19–32)
CO2: 21 mEq/L (ref 19–32)
Calcium: 8.4 mg/dL (ref 8.4–10.5)
Calcium: 8.6 mg/dL (ref 8.4–10.5)
Calcium: 8.8 mg/dL (ref 8.4–10.5)
Calcium: 8.8 mg/dL (ref 8.4–10.5)
Calcium: 9.1 mg/dL (ref 8.4–10.5)
Chloride: 96 mEq/L (ref 96–112)
Creatinine, Ser: 10.68 mg/dL — ABNORMAL HIGH (ref 0.4–1.2)
Creatinine, Ser: 6.54 mg/dL — ABNORMAL HIGH (ref 0.4–1.2)
Creatinine, Ser: 7.19 mg/dL — ABNORMAL HIGH (ref 0.4–1.2)
Creatinine, Ser: 8.38 mg/dL — ABNORMAL HIGH (ref 0.4–1.2)
GFR calc Af Amer: 5 mL/min — ABNORMAL LOW (ref >60)
GFR calc Af Amer: 6 mL/min — ABNORMAL LOW (ref >60)
GFR calc non Af Amer: 4 mL/min — ABNORMAL LOW (ref >60)
GFR calc non Af Amer: 5 mL/min — ABNORMAL LOW (ref >60)
Glucose, Bld: 159 mg/dL — ABNORMAL HIGH (ref 70–99)
Glucose, Bld: 184 mg/dL — ABNORMAL HIGH (ref 70–99)
Phosphorus: 5.4 mg/dL — ABNORMAL HIGH (ref 2.3–4.6)
Phosphorus: 5.8 mg/dL — ABNORMAL HIGH (ref 2.3–4.6)
Phosphorus: 7.2 mg/dL — ABNORMAL HIGH (ref 2.3–4.6)
Potassium: 3.9 mEq/L (ref 3.5–5.1)
Potassium: 4.7 mEq/L (ref 3.5–5.1)
Sodium: 131 mEq/L — ABNORMAL LOW (ref 135–145)
Sodium: 134 mEq/L — ABNORMAL LOW (ref 135–145)
Sodium: 137 mEq/L (ref 135–145)

## 2010-05-14 LAB — IRON AND TIBC
Iron: 14 ug/dL — ABNORMAL LOW (ref 42–135)
TIBC: 177 ug/dL — ABNORMAL LOW (ref 250–470)
UIBC: 163 ug/dL

## 2010-05-14 LAB — BLOOD GAS, ARTERIAL
Acid-base deficit: 6.2 mmol/L — ABNORMAL HIGH (ref 0.0–2.0)
Bicarbonate: 18.6 mEq/L — ABNORMAL LOW (ref 20.0–24.0)
Patient temperature: 97.4
TCO2: 19.8 mmol/L (ref 0–100)
pCO2 arterial: 35.5 mmHg (ref 35.0–45.0)
pH, Arterial: 7.336 — ABNORMAL LOW (ref 7.350–7.400)

## 2010-05-14 LAB — CROSSMATCH: ABO/RH(D): O POS

## 2010-05-14 LAB — HEMOCCULT GUIAC POC 1CARD (OFFICE): Fecal Occult Bld: NEGATIVE

## 2010-05-14 LAB — HEMOGLOBIN A1C
Hgb A1c MFr Bld: 6.1 % (ref 4.6–6.1)
Mean Plasma Glucose: 128 mg/dL

## 2010-05-14 LAB — POCT I-STAT 4, (NA,K, GLUC, HGB,HCT): Hemoglobin: 13.9 g/dL (ref 12.0–15.0)

## 2010-05-14 LAB — PHOSPHORUS: Phosphorus: 6.1 mg/dL — ABNORMAL HIGH (ref 2.3–4.6)

## 2010-05-14 LAB — HEPATITIS B SURFACE ANTIGEN: Hepatitis B Surface Ag: NEGATIVE

## 2010-05-14 LAB — HEPARIN LEVEL (UNFRACTIONATED): Heparin Unfractionated: 0.24 IU/mL — ABNORMAL LOW (ref 0.30–0.70)

## 2010-05-30 LAB — COMPREHENSIVE METABOLIC PANEL
ALT: 17 U/L (ref 0–35)
ALT: 18 U/L (ref 0–35)
AST: 20 U/L (ref 0–37)
Albumin: 2.7 g/dL — ABNORMAL LOW (ref 3.5–5.2)
Albumin: 3.6 g/dL (ref 3.5–5.2)
Alkaline Phosphatase: 148 U/L — ABNORMAL HIGH (ref 39–117)
Alkaline Phosphatase: 90 U/L (ref 39–117)
Alkaline Phosphatase: 97 U/L (ref 39–117)
BUN: 12 mg/dL (ref 6–23)
BUN: 23 mg/dL (ref 6–23)
BUN: 30 mg/dL — ABNORMAL HIGH (ref 6–23)
BUN: 43 mg/dL — ABNORMAL HIGH (ref 6–23)
CO2: 25 mEq/L (ref 19–32)
CO2: 29 mEq/L (ref 19–32)
Chloride: 101 mEq/L (ref 96–112)
Chloride: 101 mEq/L (ref 96–112)
Chloride: 97 mEq/L (ref 96–112)
Creatinine, Ser: 4.38 mg/dL — ABNORMAL HIGH (ref 0.4–1.2)
Creatinine, Ser: 7.42 mg/dL — ABNORMAL HIGH (ref 0.4–1.2)
GFR calc Af Amer: 5 mL/min — ABNORMAL LOW (ref >60)
GFR calc Af Amer: 6 mL/min — ABNORMAL LOW (ref >60)
GFR calc non Af Amer: 10 mL/min — ABNORMAL LOW (ref >60)
GFR calc non Af Amer: 4 mL/min — ABNORMAL LOW (ref >60)
GFR calc non Af Amer: 6 mL/min — ABNORMAL LOW (ref >60)
Glucose, Bld: 109 mg/dL — ABNORMAL HIGH (ref 70–99)
Glucose, Bld: 65 mg/dL — ABNORMAL LOW (ref 70–99)
Potassium: 3.4 mEq/L — ABNORMAL LOW (ref 3.5–5.1)
Potassium: 4.7 mEq/L (ref 3.5–5.1)
Sodium: 138 mEq/L (ref 135–145)
Total Bilirubin: 0.2 mg/dL — ABNORMAL LOW (ref 0.3–1.2)
Total Bilirubin: 0.5 mg/dL (ref 0.3–1.2)
Total Bilirubin: 0.6 mg/dL (ref 0.3–1.2)
Total Bilirubin: 0.7 mg/dL (ref 0.3–1.2)
Total Protein: 7.4 g/dL (ref 6.0–8.3)

## 2010-05-30 LAB — CARDIAC PANEL(CRET KIN+CKTOT+MB+TROPI)
CK, MB: 3.4 ng/mL (ref 0.3–4.0)
CK, MB: 4.9 ng/mL — ABNORMAL HIGH (ref 0.3–4.0)
CK, MB: 5.2 ng/mL — ABNORMAL HIGH (ref 0.3–4.0)
Relative Index: INVALID (ref 0.0–2.5)
Total CK: 29 U/L (ref 7–177)
Total CK: 49 U/L (ref 7–177)
Total CK: 53 U/L (ref 7–177)
Total CK: 65 U/L (ref 7–177)
Troponin I: 0.05 ng/mL (ref 0.00–0.06)
Troponin I: 0.06 ng/mL (ref 0.00–0.06)
Troponin I: 0.08 ng/mL — ABNORMAL HIGH (ref 0.00–0.06)
Troponin I: 0.11 ng/mL — ABNORMAL HIGH (ref 0.00–0.06)

## 2010-05-30 LAB — RENAL FUNCTION PANEL
CO2: 26 mEq/L (ref 19–32)
Chloride: 98 mEq/L (ref 96–112)
Creatinine, Ser: 5.43 mg/dL — ABNORMAL HIGH (ref 0.4–1.2)
GFR calc Af Amer: 10 mL/min — ABNORMAL LOW (ref >60)
GFR calc non Af Amer: 8 mL/min — ABNORMAL LOW (ref >60)
Glucose, Bld: 216 mg/dL — ABNORMAL HIGH (ref 70–99)
Sodium: 137 mEq/L (ref 135–145)

## 2010-05-30 LAB — CBC
HCT: 35.7 % — ABNORMAL LOW (ref 36.0–46.0)
HCT: 40.3 % (ref 36.0–46.0)
HCT: 48.4 % — ABNORMAL HIGH (ref 36.0–46.0)
Hemoglobin: 12.8 g/dL (ref 12.0–15.0)
MCHC: 31.9 g/dL (ref 30.0–36.0)
MCHC: 31.9 g/dL (ref 30.0–36.0)
MCHC: 32.6 g/dL (ref 30.0–36.0)
MCV: 96.2 fL (ref 78.0–100.0)
MCV: 96.4 fL (ref 78.0–100.0)
MCV: 97.9 fL (ref 78.0–100.0)
Platelets: 155 10*3/uL (ref 150–400)
Platelets: 212 10*3/uL (ref 150–400)
Platelets: 236 10*3/uL (ref 150–400)
Platelets: 245 10*3/uL (ref 150–400)
RDW: 17.7 % — ABNORMAL HIGH (ref 11.5–15.5)
RDW: 17.8 % — ABNORMAL HIGH (ref 11.5–15.5)
WBC: 6.9 10*3/uL (ref 4.0–10.5)
WBC: 7.7 10*3/uL (ref 4.0–10.5)
WBC: 8.6 10*3/uL (ref 4.0–10.5)
WBC: 9.4 10*3/uL (ref 4.0–10.5)

## 2010-05-30 LAB — GLUCOSE, CAPILLARY
Glucose-Capillary: 117 mg/dL — ABNORMAL HIGH (ref 70–99)
Glucose-Capillary: 125 mg/dL — ABNORMAL HIGH (ref 70–99)
Glucose-Capillary: 137 mg/dL — ABNORMAL HIGH (ref 70–99)
Glucose-Capillary: 139 mg/dL — ABNORMAL HIGH (ref 70–99)
Glucose-Capillary: 141 mg/dL — ABNORMAL HIGH (ref 70–99)
Glucose-Capillary: 154 mg/dL — ABNORMAL HIGH (ref 70–99)
Glucose-Capillary: 157 mg/dL — ABNORMAL HIGH (ref 70–99)
Glucose-Capillary: 174 mg/dL — ABNORMAL HIGH (ref 70–99)
Glucose-Capillary: 185 mg/dL — ABNORMAL HIGH (ref 70–99)
Glucose-Capillary: 186 mg/dL — ABNORMAL HIGH (ref 70–99)
Glucose-Capillary: 204 mg/dL — ABNORMAL HIGH (ref 70–99)
Glucose-Capillary: 61 mg/dL — ABNORMAL LOW (ref 70–99)
Glucose-Capillary: 81 mg/dL (ref 70–99)
Glucose-Capillary: 90 mg/dL (ref 70–99)

## 2010-05-30 LAB — URINE MICROSCOPIC-ADD ON

## 2010-05-30 LAB — URINALYSIS, ROUTINE W REFLEX MICROSCOPIC
Bilirubin Urine: NEGATIVE
Glucose, UA: 100 mg/dL — AB
Ketones, ur: NEGATIVE mg/dL
Specific Gravity, Urine: 1.014 (ref 1.005–1.030)
pH: 7 (ref 5.0–8.0)

## 2010-05-30 LAB — POCT CARDIAC MARKERS: Troponin i, poc: <0.05 ng/mL (ref 0.00–0.09)

## 2010-05-30 LAB — BASIC METABOLIC PANEL
CO2: 23 mEq/L (ref 19–32)
CO2: 25 mEq/L (ref 19–32)
Calcium: 8.9 mg/dL (ref 8.4–10.5)
Calcium: 9.2 mg/dL (ref 8.4–10.5)
Chloride: 96 mEq/L (ref 96–112)
GFR calc Af Amer: 5 mL/min — ABNORMAL LOW (ref >60)
GFR calc Af Amer: 6 mL/min — ABNORMAL LOW (ref >60)
GFR calc non Af Amer: 5 mL/min — ABNORMAL LOW (ref >60)
Glucose, Bld: 165 mg/dL — ABNORMAL HIGH (ref 70–99)
Glucose, Bld: 203 mg/dL — ABNORMAL HIGH (ref 70–99)
Potassium: 4.5 mEq/L (ref 3.5–5.1)
Potassium: 5 mEq/L (ref 3.5–5.1)
Sodium: 134 mEq/L — ABNORMAL LOW (ref 135–145)
Sodium: 137 mEq/L (ref 135–145)

## 2010-05-30 LAB — HEMOGLOBIN A1C: Hgb A1c MFr Bld: 7.4 % — ABNORMAL HIGH (ref 4.6–6.1)

## 2010-05-30 LAB — CULTURE, BLOOD (ROUTINE X 2)
Culture: NO GROWTH
Culture: NO GROWTH

## 2010-05-30 LAB — DIFFERENTIAL
Basophils Absolute: 0 10*3/uL (ref 0.0–0.1)
Basophils Relative: 0 % (ref 0–1)
Eosinophils Relative: 0 % (ref 0–5)
Monocytes Absolute: 0.5 10*3/uL (ref 0.1–1.0)

## 2010-05-30 LAB — PHOSPHORUS: Phosphorus: 10.7 mg/dL — ABNORMAL HIGH (ref 2.3–4.6)

## 2010-05-30 LAB — TSH: TSH: 0.285 u[IU]/mL — ABNORMAL LOW (ref 0.350–4.500)

## 2010-05-30 LAB — TROPONIN I: Troponin I: 0.1 ng/mL — ABNORMAL HIGH (ref 0.00–0.06)

## 2010-05-30 LAB — LIPASE, BLOOD: Lipase: 52 U/L (ref 11–59)

## 2010-05-30 LAB — URINE CULTURE: Special Requests: POSITIVE

## 2010-05-30 LAB — C-REACTIVE PROTEIN: CRP: 2.8 mg/dL — ABNORMAL HIGH (ref <0.6)

## 2010-05-30 LAB — T4, FREE: Free T4: 0.77 ng/dL — ABNORMAL LOW (ref 0.80–1.80)

## 2010-05-30 LAB — CK TOTAL AND CKMB (NOT AT ARMC): CK, MB: 4 ng/mL (ref 0.3–4.0)

## 2010-05-30 LAB — ALBUMIN: Albumin: 3 g/dL — ABNORMAL LOW (ref 3.5–5.2)

## 2010-06-01 LAB — URINALYSIS, ROUTINE W REFLEX MICROSCOPIC
Ketones, ur: NEGATIVE mg/dL
Nitrite: NEGATIVE
Specific Gravity, Urine: 1.025 (ref 1.005–1.030)
pH: 6 (ref 5.0–8.0)

## 2010-06-01 LAB — DIFFERENTIAL
Basophils Absolute: 0 10*3/uL (ref 0.0–0.1)
Basophils Relative: 0 % (ref 0–1)
Neutro Abs: 5.6 10*3/uL (ref 1.7–7.7)
Neutrophils Relative %: 67 % (ref 43–77)

## 2010-06-01 LAB — POCT I-STAT, CHEM 8
Chloride: 99 mEq/L (ref 96–112)
HCT: 54 % — ABNORMAL HIGH (ref 36.0–46.0)
Potassium: 4 mEq/L (ref 3.5–5.1)

## 2010-06-01 LAB — URINE MICROSCOPIC-ADD ON

## 2010-06-01 LAB — CBC
MCHC: 32.3 g/dL (ref 30.0–36.0)
RDW: 19.3 % — ABNORMAL HIGH (ref 11.5–15.5)

## 2010-06-02 LAB — GLUCOSE, CAPILLARY
Glucose-Capillary: 111 mg/dL — ABNORMAL HIGH (ref 70–99)
Glucose-Capillary: 123 mg/dL — ABNORMAL HIGH (ref 70–99)
Glucose-Capillary: 137 mg/dL — ABNORMAL HIGH (ref 70–99)
Glucose-Capillary: 163 mg/dL — ABNORMAL HIGH (ref 70–99)
Glucose-Capillary: 192 mg/dL — ABNORMAL HIGH (ref 70–99)
Glucose-Capillary: 195 mg/dL — ABNORMAL HIGH (ref 70–99)
Glucose-Capillary: 230 mg/dL — ABNORMAL HIGH (ref 70–99)
Glucose-Capillary: 93 mg/dL (ref 70–99)
Glucose-Capillary: 96 mg/dL (ref 70–99)
Glucose-Capillary: 97 mg/dL (ref 70–99)

## 2010-06-02 LAB — RENAL FUNCTION PANEL
BUN: 48 mg/dL — ABNORMAL HIGH (ref 6–23)
CO2: 26 mEq/L (ref 19–32)
CO2: 27 mEq/L (ref 19–32)
Calcium: 9.9 mg/dL (ref 8.4–10.5)
Chloride: 102 mEq/L (ref 96–112)
Creatinine, Ser: 8.54 mg/dL — ABNORMAL HIGH (ref 0.4–1.2)
Glucose, Bld: 117 mg/dL — ABNORMAL HIGH (ref 70–99)
Glucose, Bld: 142 mg/dL — ABNORMAL HIGH (ref 70–99)
Glucose, Bld: 94 mg/dL (ref 70–99)
Phosphorus: 3.1 mg/dL (ref 2.3–4.6)
Phosphorus: 4.4 mg/dL (ref 2.3–4.6)
Potassium: 4.1 mEq/L (ref 3.5–5.1)
Potassium: 4.3 mEq/L (ref 3.5–5.1)
Sodium: 136 mEq/L (ref 135–145)
Sodium: 138 mEq/L (ref 135–145)
Sodium: 141 mEq/L (ref 135–145)

## 2010-06-02 LAB — CBC
HCT: 34.2 % — ABNORMAL LOW (ref 36.0–46.0)
Hemoglobin: 10.9 g/dL — ABNORMAL LOW (ref 12.0–15.0)
Hemoglobin: 11.3 g/dL — ABNORMAL LOW (ref 12.0–15.0)
Hemoglobin: 11.7 g/dL — ABNORMAL LOW (ref 12.0–15.0)
MCHC: 31.8 g/dL (ref 30.0–36.0)
MCHC: 32.4 g/dL (ref 30.0–36.0)
RBC: 3.59 MIL/uL — ABNORMAL LOW (ref 3.87–5.11)
RBC: 3.77 MIL/uL — ABNORMAL LOW (ref 3.87–5.11)
RDW: 17.6 % — ABNORMAL HIGH (ref 11.5–15.5)
WBC: 7.5 10*3/uL (ref 4.0–10.5)

## 2010-06-03 LAB — CBC
HCT: 39.2 % (ref 36.0–46.0)
HCT: 40.7 % (ref 36.0–46.0)
HCT: 41.6 % (ref 36.0–46.0)
HCT: 42 % (ref 36.0–46.0)
HCT: 44.5 % (ref 36.0–46.0)
Hemoglobin: 11.9 g/dL — ABNORMAL LOW (ref 12.0–15.0)
Hemoglobin: 13.3 g/dL (ref 12.0–15.0)
Hemoglobin: 13.5 g/dL (ref 12.0–15.0)
MCHC: 31.6 g/dL (ref 30.0–36.0)
MCHC: 31.9 g/dL (ref 30.0–36.0)
MCHC: 32.1 g/dL (ref 30.0–36.0)
MCV: 93.9 fL (ref 78.0–100.0)
MCV: 94.2 fL (ref 78.0–100.0)
MCV: 94.3 fL (ref 78.0–100.0)
MCV: 95 fL (ref 78.0–100.0)
MCV: 95.1 fL (ref 78.0–100.0)
Platelets: 164 10*3/uL (ref 150–400)
Platelets: 219 10*3/uL (ref 150–400)
RBC: 3.91 MIL/uL (ref 3.87–5.11)
RBC: 4.28 MIL/uL (ref 3.87–5.11)
RBC: 4.45 MIL/uL (ref 3.87–5.11)
RDW: 17.9 % — ABNORMAL HIGH (ref 11.5–15.5)
RDW: 18.4 % — ABNORMAL HIGH (ref 11.5–15.5)
RDW: 18.4 % — ABNORMAL HIGH (ref 11.5–15.5)
WBC: 6.9 10*3/uL (ref 4.0–10.5)
WBC: 7.3 10*3/uL (ref 4.0–10.5)
WBC: 7.6 10*3/uL (ref 4.0–10.5)
WBC: 7.7 10*3/uL (ref 4.0–10.5)

## 2010-06-03 LAB — RENAL FUNCTION PANEL
Albumin: 2.9 g/dL — ABNORMAL LOW (ref 3.5–5.2)
Albumin: 3.1 g/dL — ABNORMAL LOW (ref 3.5–5.2)
Albumin: 3.3 g/dL — ABNORMAL LOW (ref 3.5–5.2)
Albumin: 3.4 g/dL — ABNORMAL LOW (ref 3.5–5.2)
BUN: 25 mg/dL — ABNORMAL HIGH (ref 6–23)
BUN: 48 mg/dL — ABNORMAL HIGH (ref 6–23)
BUN: 57 mg/dL — ABNORMAL HIGH (ref 6–23)
CO2: 22 mEq/L (ref 19–32)
CO2: 23 mEq/L (ref 19–32)
CO2: 24 mEq/L (ref 19–32)
CO2: 27 mEq/L (ref 19–32)
Calcium: 9.5 mg/dL (ref 8.4–10.5)
Chloride: 102 mEq/L (ref 96–112)
Chloride: 102 mEq/L (ref 96–112)
Chloride: 105 mEq/L (ref 96–112)
Chloride: 99 mEq/L (ref 96–112)
Creatinine, Ser: 5.89 mg/dL — ABNORMAL HIGH (ref 0.4–1.2)
Creatinine, Ser: 6.33 mg/dL — ABNORMAL HIGH (ref 0.4–1.2)
Creatinine, Ser: 7.61 mg/dL — ABNORMAL HIGH (ref 0.4–1.2)
Creatinine, Ser: 9.23 mg/dL — ABNORMAL HIGH (ref 0.4–1.2)
GFR calc Af Amer: 8 mL/min — ABNORMAL LOW (ref >60)
GFR calc Af Amer: 9 mL/min — ABNORMAL LOW (ref >60)
GFR calc non Af Amer: 6 mL/min — ABNORMAL LOW (ref >60)
GFR calc non Af Amer: 7 mL/min — ABNORMAL LOW (ref >60)
GFR calc non Af Amer: 7 mL/min — ABNORMAL LOW (ref >60)
Glucose, Bld: 115 mg/dL — ABNORMAL HIGH (ref 70–99)
Glucose, Bld: 123 mg/dL — ABNORMAL HIGH (ref 70–99)
Glucose, Bld: 140 mg/dL — ABNORMAL HIGH (ref 70–99)
Glucose, Bld: 89 mg/dL (ref 70–99)
Phosphorus: 4.6 mg/dL (ref 2.3–4.6)
Potassium: 3.4 mEq/L — ABNORMAL LOW (ref 3.5–5.1)
Potassium: 4 mEq/L (ref 3.5–5.1)
Potassium: 4.1 mEq/L (ref 3.5–5.1)
Potassium: 5 mEq/L (ref 3.5–5.1)
Sodium: 135 mEq/L (ref 135–145)
Sodium: 136 mEq/L (ref 135–145)

## 2010-06-03 LAB — POCT I-STAT, CHEM 8
BUN: 60 mg/dL — ABNORMAL HIGH (ref 6–23)
Calcium, Ion: 0.96 mmol/L — ABNORMAL LOW (ref 1.12–1.32)
HCT: 48 % — ABNORMAL HIGH (ref 36.0–46.0)
Sodium: 138 mEq/L (ref 135–145)
TCO2: 18 mmol/L (ref 0–100)

## 2010-06-03 LAB — GLUCOSE, CAPILLARY
Glucose-Capillary: 109 mg/dL — ABNORMAL HIGH (ref 70–99)
Glucose-Capillary: 120 mg/dL — ABNORMAL HIGH (ref 70–99)
Glucose-Capillary: 137 mg/dL — ABNORMAL HIGH (ref 70–99)
Glucose-Capillary: 158 mg/dL — ABNORMAL HIGH (ref 70–99)
Glucose-Capillary: 170 mg/dL — ABNORMAL HIGH (ref 70–99)
Glucose-Capillary: 206 mg/dL — ABNORMAL HIGH (ref 70–99)
Glucose-Capillary: 78 mg/dL (ref 70–99)
Glucose-Capillary: 91 mg/dL (ref 70–99)

## 2010-06-03 LAB — DIFFERENTIAL
Basophils Absolute: 0 10*3/uL (ref 0.0–0.1)
Eosinophils Absolute: 0.2 10*3/uL (ref 0.0–0.7)
Eosinophils Relative: 3 % (ref 0–5)
Lymphocytes Relative: 28 % (ref 12–46)

## 2010-06-03 LAB — URINE CULTURE

## 2010-06-03 LAB — CARDIAC PANEL(CRET KIN+CKTOT+MB+TROPI)
CK, MB: 1 ng/mL (ref 0.3–4.0)
CK, MB: 1.4 ng/mL (ref 0.3–4.0)
Relative Index: INVALID (ref 0.0–2.5)
Total CK: 18 U/L (ref 7–177)
Total CK: 29 U/L (ref 7–177)

## 2010-06-03 LAB — T4: T4, Total: 4.7 ug/dL — ABNORMAL LOW (ref 5.0–12.5)

## 2010-06-03 LAB — HEPATITIS B SURFACE ANTIGEN
Hepatitis B Surface Ag: NEGATIVE
Hepatitis B Surface Ag: NEGATIVE

## 2010-06-03 LAB — URINALYSIS, ROUTINE W REFLEX MICROSCOPIC
Bilirubin Urine: NEGATIVE
Ketones, ur: NEGATIVE mg/dL
Protein, ur: >300 mg/dL — AB
Urobilinogen, UA: 0.2 mg/dL (ref 0.0–1.0)

## 2010-06-03 LAB — CULTURE, BLOOD (ROUTINE X 2)

## 2010-06-03 LAB — T3: T3, Total: 48 ng/dl — ABNORMAL LOW (ref 80.0–204.0)

## 2010-06-03 LAB — TSH: TSH: 0.088 u[IU]/mL — ABNORMAL LOW (ref 0.350–4.500)

## 2010-06-04 LAB — GLUCOSE, CAPILLARY
Glucose-Capillary: 115 mg/dL — ABNORMAL HIGH (ref 70–99)
Glucose-Capillary: 119 mg/dL — ABNORMAL HIGH (ref 70–99)
Glucose-Capillary: 131 mg/dL — ABNORMAL HIGH (ref 70–99)
Glucose-Capillary: 135 mg/dL — ABNORMAL HIGH (ref 70–99)
Glucose-Capillary: 139 mg/dL — ABNORMAL HIGH (ref 70–99)
Glucose-Capillary: 141 mg/dL — ABNORMAL HIGH (ref 70–99)
Glucose-Capillary: 141 mg/dL — ABNORMAL HIGH (ref 70–99)
Glucose-Capillary: 155 mg/dL — ABNORMAL HIGH (ref 70–99)
Glucose-Capillary: 155 mg/dL — ABNORMAL HIGH (ref 70–99)
Glucose-Capillary: 159 mg/dL — ABNORMAL HIGH (ref 70–99)
Glucose-Capillary: 161 mg/dL — ABNORMAL HIGH (ref 70–99)
Glucose-Capillary: 170 mg/dL — ABNORMAL HIGH (ref 70–99)
Glucose-Capillary: 178 mg/dL — ABNORMAL HIGH (ref 70–99)
Glucose-Capillary: 188 mg/dL — ABNORMAL HIGH (ref 70–99)
Glucose-Capillary: 194 mg/dL — ABNORMAL HIGH (ref 70–99)
Glucose-Capillary: 197 mg/dL — ABNORMAL HIGH (ref 70–99)
Glucose-Capillary: 204 mg/dL — ABNORMAL HIGH (ref 70–99)
Glucose-Capillary: 210 mg/dL — ABNORMAL HIGH (ref 70–99)
Glucose-Capillary: 221 mg/dL — ABNORMAL HIGH (ref 70–99)
Glucose-Capillary: 250 mg/dL — ABNORMAL HIGH (ref 70–99)

## 2010-06-04 LAB — CARDIAC PANEL(CRET KIN+CKTOT+MB+TROPI)
CK, MB: 1.6 ng/mL (ref 0.3–4.0)
Total CK: 16 U/L (ref 7–177)
Total CK: 24 U/L (ref 7–177)
Troponin I: 0.05 ng/mL (ref 0.00–0.06)

## 2010-06-04 LAB — CBC
HCT: 45.4 % (ref 36.0–46.0)
HCT: 49 % — ABNORMAL HIGH (ref 36.0–46.0)
HCT: 49.4 % — ABNORMAL HIGH (ref 36.0–46.0)
Hemoglobin: 14.6 g/dL (ref 12.0–15.0)
Hemoglobin: 14.6 g/dL (ref 12.0–15.0)
Hemoglobin: 14.6 g/dL (ref 12.0–15.0)
Hemoglobin: 15.4 g/dL — ABNORMAL HIGH (ref 12.0–15.0)
MCHC: 31.3 g/dL (ref 30.0–36.0)
MCHC: 31.5 g/dL (ref 30.0–36.0)
MCHC: 31.5 g/dL (ref 30.0–36.0)
MCHC: 31.7 g/dL (ref 30.0–36.0)
MCHC: 32 g/dL (ref 30.0–36.0)
MCHC: 32.1 g/dL (ref 30.0–36.0)
MCV: 94.8 fL (ref 78.0–100.0)
MCV: 97.5 fL (ref 78.0–100.0)
MCV: 97.7 fL (ref 78.0–100.0)
MCV: 97.7 fL (ref 78.0–100.0)
MCV: 97.8 fL (ref 78.0–100.0)
Platelets: 183 10*3/uL (ref 150–400)
Platelets: 206 10*3/uL (ref 150–400)
Platelets: 208 10*3/uL (ref 150–400)
RBC: 4.78 MIL/uL (ref 3.87–5.11)
RBC: 4.73 MIL/uL (ref 3.87–5.11)
RBC: 5.01 MIL/uL (ref 3.87–5.11)
RBC: 5.06 MIL/uL (ref 3.87–5.11)
RDW: 18.4 % — ABNORMAL HIGH (ref 11.5–15.5)
RDW: 18.4 % — ABNORMAL HIGH (ref 11.5–15.5)
RDW: 18.6 % — ABNORMAL HIGH (ref 11.5–15.5)
RDW: 18.9 % — ABNORMAL HIGH (ref 11.5–15.5)
WBC: 6.3 10*3/uL (ref 4.0–10.5)
WBC: 15.3 10*3/uL — ABNORMAL HIGH (ref 4.0–10.5)
WBC: 8.4 10*3/uL (ref 4.0–10.5)

## 2010-06-04 LAB — URINALYSIS, ROUTINE W REFLEX MICROSCOPIC
Glucose, UA: NEGATIVE mg/dL
Ketones, ur: NEGATIVE mg/dL
Nitrite: NEGATIVE
Specific Gravity, Urine: 1.014 (ref 1.005–1.030)
pH: 6.5 (ref 5.0–8.0)

## 2010-06-04 LAB — DIFFERENTIAL
Basophils Absolute: 0 10*3/uL (ref 0.0–0.1)
Basophils Relative: 0 % (ref 0–1)
Eosinophils Absolute: 0.1 10*3/uL (ref 0.0–0.7)
Eosinophils Absolute: 0 10*3/uL (ref 0.0–0.7)
Lymphs Abs: 2 10*3/uL (ref 0.7–4.0)
Monocytes Absolute: 0.6 10*3/uL (ref 0.1–1.0)
Monocytes Relative: 10 % (ref 3–12)
Monocytes Relative: 14 % — ABNORMAL HIGH (ref 3–12)
Neutro Abs: 3.5 10*3/uL (ref 1.7–7.7)
Neutrophils Relative %: 56 % (ref 43–77)

## 2010-06-04 LAB — BASIC METABOLIC PANEL
CO2: 25 mEq/L (ref 19–32)
Chloride: 96 mEq/L (ref 96–112)
Creatinine, Ser: 5.92 mg/dL — ABNORMAL HIGH (ref 0.4–1.2)
GFR calc Af Amer: 9 mL/min — ABNORMAL LOW (ref >60)
Potassium: 4.8 mEq/L (ref 3.5–5.1)

## 2010-06-04 LAB — RENAL FUNCTION PANEL
Albumin: 2.9 g/dL — ABNORMAL LOW (ref 3.5–5.2)
Albumin: 2.9 g/dL — ABNORMAL LOW (ref 3.5–5.2)
Albumin: 3 g/dL — ABNORMAL LOW (ref 3.5–5.2)
BUN: 37 mg/dL — ABNORMAL HIGH (ref 6–23)
BUN: 53 mg/dL — ABNORMAL HIGH (ref 6–23)
CO2: 30 mEq/L (ref 19–32)
Calcium: 8.9 mg/dL (ref 8.4–10.5)
Calcium: 9.7 mg/dL (ref 8.4–10.5)
Chloride: 92 mEq/L — ABNORMAL LOW (ref 96–112)
Creatinine, Ser: 7.87 mg/dL — ABNORMAL HIGH (ref 0.4–1.2)
GFR calc Af Amer: 6 mL/min — ABNORMAL LOW (ref >60)
Glucose, Bld: 113 mg/dL — ABNORMAL HIGH (ref 70–99)
Glucose, Bld: 124 mg/dL — ABNORMAL HIGH (ref 70–99)
Glucose, Bld: 148 mg/dL — ABNORMAL HIGH (ref 70–99)
Phosphorus: 6.3 mg/dL — ABNORMAL HIGH (ref 2.3–4.6)
Phosphorus: 9 mg/dL — ABNORMAL HIGH (ref 2.3–4.6)
Potassium: 4.3 mEq/L (ref 3.5–5.1)
Sodium: 134 mEq/L — ABNORMAL LOW (ref 135–145)

## 2010-06-04 LAB — CULTURE, BLOOD (ROUTINE X 2): Culture: NO GROWTH

## 2010-06-04 LAB — POCT I-STAT, CHEM 8
BUN: 61 mg/dL — ABNORMAL HIGH (ref 6–23)
Calcium, Ion: 0.89 mmol/L — ABNORMAL LOW (ref 1.12–1.32)
Creatinine, Ser: 9.3 mg/dL — ABNORMAL HIGH (ref 0.4–1.2)
TCO2: 19 mmol/L (ref 0–100)

## 2010-06-04 LAB — URINE MICROSCOPIC-ADD ON

## 2010-06-04 LAB — COMPREHENSIVE METABOLIC PANEL
AST: 20 U/L (ref 0–37)
Albumin: 3.3 g/dL — ABNORMAL LOW (ref 3.5–5.2)
Calcium: 9.9 mg/dL (ref 8.4–10.5)
Creatinine, Ser: 6.89 mg/dL — ABNORMAL HIGH (ref 0.4–1.2)
GFR calc Af Amer: 8 mL/min — ABNORMAL LOW (ref >60)
GFR calc non Af Amer: 6 mL/min — ABNORMAL LOW (ref >60)
Total Protein: 7.7 g/dL (ref 6.0–8.3)

## 2010-06-04 LAB — LACTIC ACID, PLASMA: Lactic Acid, Venous: 1.6 mmol/L (ref 0.5–2.2)

## 2010-06-04 LAB — PROTIME-INR: INR: 1 (ref 0.00–1.49)

## 2010-06-04 LAB — LIPASE, BLOOD: Lipase: 37 U/L (ref 11–59)

## 2010-06-04 LAB — HEPATIC FUNCTION PANEL
AST: 14 U/L (ref 0–37)
Albumin: 3 g/dL — ABNORMAL LOW (ref 3.5–5.2)
Alkaline Phosphatase: 90 U/L (ref 39–117)
Bilirubin, Direct: <0.1 mg/dL (ref 0.0–0.3)
Total Bilirubin: 0.4 mg/dL (ref 0.3–1.2)

## 2010-06-04 LAB — CK TOTAL AND CKMB (NOT AT ARMC)
Relative Index: INVALID (ref 0.0–2.5)
Total CK: 77 U/L (ref 7–177)

## 2010-06-04 LAB — APTT: aPTT: 22 seconds — ABNORMAL LOW (ref 24–37)

## 2010-06-04 LAB — URINE CULTURE

## 2010-06-05 LAB — POCT I-STAT 3, ART BLOOD GAS (G3+)
Acid-Base Excess: 3 mmol/L — ABNORMAL HIGH (ref 0.0–2.0)
Bicarbonate: 27.5 mEq/L — ABNORMAL HIGH (ref 20.0–24.0)
O2 Saturation: 96 %
TCO2: 29 mmol/L (ref 0–100)
pH, Arterial: 7.434 — ABNORMAL HIGH (ref 7.350–7.400)

## 2010-06-05 LAB — RENAL FUNCTION PANEL
CO2: 26 mEq/L (ref 19–32)
Chloride: 95 mEq/L — ABNORMAL LOW (ref 96–112)
Creatinine, Ser: 7.55 mg/dL — ABNORMAL HIGH (ref 0.4–1.2)
GFR calc Af Amer: 7 mL/min — ABNORMAL LOW (ref >60)
GFR calc non Af Amer: 6 mL/min — ABNORMAL LOW (ref >60)
Glucose, Bld: 101 mg/dL — ABNORMAL HIGH (ref 70–99)

## 2010-06-05 LAB — APTT: aPTT: 23 seconds — ABNORMAL LOW (ref 24–37)

## 2010-06-05 LAB — CBC
HCT: 41 % (ref 36.0–46.0)
Hemoglobin: 13.9 g/dL (ref 12.0–15.0)
MCHC: 31.8 g/dL (ref 30.0–36.0)
MCHC: 32.4 g/dL (ref 30.0–36.0)
MCV: 97.8 fL (ref 78.0–100.0)
Platelets: 247 10*3/uL (ref 150–400)
RBC: 4.44 MIL/uL (ref 3.87–5.11)
RDW: 20.1 % — ABNORMAL HIGH (ref 11.5–15.5)

## 2010-06-05 LAB — BASIC METABOLIC PANEL
CO2: 27 mEq/L (ref 19–32)
Calcium: 9 mg/dL (ref 8.4–10.5)
GFR calc Af Amer: 10 mL/min — ABNORMAL LOW (ref >60)
GFR calc non Af Amer: 8 mL/min — ABNORMAL LOW (ref >60)
Sodium: 135 mEq/L (ref 135–145)

## 2010-06-05 LAB — GLUCOSE, CAPILLARY
Glucose-Capillary: 119 mg/dL — ABNORMAL HIGH (ref 70–99)
Glucose-Capillary: 124 mg/dL — ABNORMAL HIGH (ref 70–99)
Glucose-Capillary: 126 mg/dL — ABNORMAL HIGH (ref 70–99)
Glucose-Capillary: 160 mg/dL — ABNORMAL HIGH (ref 70–99)
Glucose-Capillary: 217 mg/dL — ABNORMAL HIGH (ref 70–99)

## 2010-06-05 LAB — LIPID PANEL
Cholesterol: 164 mg/dL (ref 0–200)
LDL Cholesterol: 111 mg/dL — ABNORMAL HIGH (ref 0–99)
Triglycerides: 109 mg/dL (ref <150)
VLDL: 22 mg/dL (ref 0–40)

## 2010-06-05 LAB — CULTURE, BLOOD (ROUTINE X 2)

## 2010-06-05 LAB — COMPREHENSIVE METABOLIC PANEL
ALT: 14 U/L (ref 0–35)
Alkaline Phosphatase: 111 U/L (ref 39–117)
CO2: 27 mEq/L (ref 19–32)
GFR calc non Af Amer: 8 mL/min — ABNORMAL LOW (ref >60)
Glucose, Bld: 106 mg/dL — ABNORMAL HIGH (ref 70–99)
Potassium: 4.6 mEq/L (ref 3.5–5.1)
Sodium: 138 mEq/L (ref 135–145)

## 2010-06-05 LAB — DIFFERENTIAL
Basophils Relative: 1 % (ref 0–1)
Eosinophils Absolute: 0.1 10*3/uL (ref 0.0–0.7)
Neutrophils Relative %: 69 % (ref 43–77)

## 2010-06-05 LAB — CARDIAC PANEL(CRET KIN+CKTOT+MB+TROPI)
CK, MB: 1.2 ng/mL (ref 0.3–4.0)
CK, MB: 1.3 ng/mL (ref 0.3–4.0)
Relative Index: INVALID (ref 0.0–2.5)
Total CK: 28 U/L (ref 7–177)
Troponin I: 0.01 ng/mL (ref 0.00–0.06)
Troponin I: 0.01 ng/mL (ref 0.00–0.06)
Troponin I: 0.03 ng/mL (ref 0.00–0.06)

## 2010-06-05 LAB — CK TOTAL AND CKMB (NOT AT ARMC): Relative Index: INVALID (ref 0.0–2.5)

## 2010-06-10 LAB — URINALYSIS, ROUTINE W REFLEX MICROSCOPIC
Bilirubin Urine: NEGATIVE
Glucose, UA: 100 mg/dL — AB
Glucose, UA: 100 mg/dL — AB
Ketones, ur: NEGATIVE mg/dL
Nitrite: NEGATIVE
Protein, ur: 100 mg/dL — AB
Specific Gravity, Urine: 1.011 (ref 1.005–1.030)
Urobilinogen, UA: 0.2 mg/dL (ref 0.0–1.0)

## 2010-06-10 LAB — COMPREHENSIVE METABOLIC PANEL
ALT: 21 U/L (ref 0–35)
Albumin: 3.5 g/dL (ref 3.5–5.2)
Alkaline Phosphatase: 100 U/L (ref 39–117)
Alkaline Phosphatase: 106 U/L (ref 39–117)
BUN: 47 mg/dL — ABNORMAL HIGH (ref 6–23)
BUN: 64 mg/dL — ABNORMAL HIGH (ref 6–23)
BUN: 70 mg/dL — ABNORMAL HIGH (ref 6–23)
CO2: 23 mEq/L (ref 19–32)
CO2: 25 mEq/L (ref 19–32)
CO2: 21 mEq/L (ref 19–32)
Calcium: 10 mg/dL (ref 8.4–10.5)
Chloride: 100 mEq/L (ref 96–112)
Chloride: 97 mEq/L (ref 96–112)
Chloride: 101 mEq/L (ref 96–112)
Creatinine, Ser: 7.43 mg/dL — ABNORMAL HIGH (ref 0.4–1.2)
Creatinine, Ser: 9.35 mg/dL — ABNORMAL HIGH (ref 0.4–1.2)
GFR calc non Af Amer: 5 mL/min — ABNORMAL LOW (ref >60)
GFR calc non Af Amer: 6 mL/min — ABNORMAL LOW (ref >60)
GFR calc non Af Amer: 4 mL/min — ABNORMAL LOW (ref >60)
Glucose, Bld: 120 mg/dL — ABNORMAL HIGH (ref 70–99)
Glucose, Bld: 158 mg/dL — ABNORMAL HIGH (ref 70–99)
Glucose, Bld: 151 mg/dL — ABNORMAL HIGH (ref 70–99)
Potassium: 4.6 mEq/L (ref 3.5–5.1)
Potassium: 4.7 mEq/L (ref 3.5–5.1)
Sodium: 143 mEq/L (ref 135–145)
Total Bilirubin: 1.1 mg/dL (ref 0.3–1.2)
Total Bilirubin: 1.6 mg/dL — ABNORMAL HIGH (ref 0.3–1.2)
Total Bilirubin: 0.9 mg/dL (ref 0.3–1.2)
Total Protein: 8.1 g/dL (ref 6.0–8.3)

## 2010-06-10 LAB — POCT I-STAT, CHEM 8
BUN: 53 mg/dL — ABNORMAL HIGH (ref 6–23)
Calcium, Ion: 1.02 mmol/L — ABNORMAL LOW (ref 1.12–1.32)
Chloride: 108 meq/L (ref 96–112)
Creatinine, Ser: 8.1 mg/dL — ABNORMAL HIGH (ref 0.4–1.2)
Glucose, Bld: 152 mg/dL — ABNORMAL HIGH (ref 70–99)
HCT: 59 % — ABNORMAL HIGH (ref 36.0–46.0)
Hemoglobin: 20.1 g/dL — ABNORMAL HIGH (ref 12.0–15.0)
Potassium: 5.2 meq/L — ABNORMAL HIGH (ref 3.5–5.1)
Sodium: 135 meq/L (ref 135–145)
TCO2: 21 mmol/L (ref 0–100)

## 2010-06-10 LAB — RENAL FUNCTION PANEL
Albumin: 2.8 g/dL — ABNORMAL LOW (ref 3.5–5.2)
Albumin: 2.9 g/dL — ABNORMAL LOW (ref 3.5–5.2)
CO2: 25 mEq/L (ref 19–32)
CO2: 26 mEq/L (ref 19–32)
Calcium: 7.7 mg/dL — ABNORMAL LOW (ref 8.4–10.5)
Calcium: 8.7 mg/dL (ref 8.4–10.5)
Calcium: 8.7 mg/dL (ref 8.4–10.5)
Chloride: 93 mEq/L — ABNORMAL LOW (ref 96–112)
GFR calc Af Amer: 5 mL/min — ABNORMAL LOW (ref >60)
GFR calc Af Amer: 6 mL/min — ABNORMAL LOW (ref >60)
GFR calc Af Amer: 7 mL/min — ABNORMAL LOW (ref >60)
GFR calc Af Amer: 7 mL/min — ABNORMAL LOW (ref >60)
GFR calc non Af Amer: 4 mL/min — ABNORMAL LOW (ref >60)
GFR calc non Af Amer: 5 mL/min — ABNORMAL LOW (ref >60)
GFR calc non Af Amer: 5 mL/min — ABNORMAL LOW (ref >60)
GFR calc non Af Amer: 6 mL/min — ABNORMAL LOW (ref >60)
Phosphorus: 6.9 mg/dL — ABNORMAL HIGH (ref 2.3–4.6)
Phosphorus: 9.1 mg/dL — ABNORMAL HIGH (ref 2.3–4.6)
Potassium: 3.5 mEq/L (ref 3.5–5.1)
Potassium: 4.4 mEq/L (ref 3.5–5.1)
Potassium: 5 mEq/L (ref 3.5–5.1)
Sodium: 133 mEq/L — ABNORMAL LOW (ref 135–145)
Sodium: 133 mEq/L — ABNORMAL LOW (ref 135–145)
Sodium: 137 mEq/L (ref 135–145)

## 2010-06-10 LAB — GLUCOSE, CAPILLARY
Glucose-Capillary: 107 mg/dL — ABNORMAL HIGH (ref 70–99)
Glucose-Capillary: 111 mg/dL — ABNORMAL HIGH (ref 70–99)
Glucose-Capillary: 119 mg/dL — ABNORMAL HIGH (ref 70–99)
Glucose-Capillary: 133 mg/dL — ABNORMAL HIGH (ref 70–99)
Glucose-Capillary: 139 mg/dL — ABNORMAL HIGH (ref 70–99)
Glucose-Capillary: 141 mg/dL — ABNORMAL HIGH (ref 70–99)
Glucose-Capillary: 148 mg/dL — ABNORMAL HIGH (ref 70–99)
Glucose-Capillary: 152 mg/dL — ABNORMAL HIGH (ref 70–99)
Glucose-Capillary: 153 mg/dL — ABNORMAL HIGH (ref 70–99)
Glucose-Capillary: 156 mg/dL — ABNORMAL HIGH (ref 70–99)
Glucose-Capillary: 158 mg/dL — ABNORMAL HIGH (ref 70–99)
Glucose-Capillary: 158 mg/dL — ABNORMAL HIGH (ref 70–99)
Glucose-Capillary: 159 mg/dL — ABNORMAL HIGH (ref 70–99)
Glucose-Capillary: 167 mg/dL — ABNORMAL HIGH (ref 70–99)
Glucose-Capillary: 171 mg/dL — ABNORMAL HIGH (ref 70–99)
Glucose-Capillary: 173 mg/dL — ABNORMAL HIGH (ref 70–99)
Glucose-Capillary: 179 mg/dL — ABNORMAL HIGH (ref 70–99)
Glucose-Capillary: 191 mg/dL — ABNORMAL HIGH (ref 70–99)
Glucose-Capillary: 193 mg/dL — ABNORMAL HIGH (ref 70–99)
Glucose-Capillary: 214 mg/dL — ABNORMAL HIGH (ref 70–99)
Glucose-Capillary: 248 mg/dL — ABNORMAL HIGH (ref 70–99)
Glucose-Capillary: 258 mg/dL — ABNORMAL HIGH (ref 70–99)
Glucose-Capillary: 133 mg/dL — ABNORMAL HIGH (ref 70–99)
Glucose-Capillary: 158 mg/dL — ABNORMAL HIGH (ref 70–99)
Glucose-Capillary: 169 mg/dL — ABNORMAL HIGH (ref 70–99)
Glucose-Capillary: 170 mg/dL — ABNORMAL HIGH (ref 70–99)
Glucose-Capillary: 188 mg/dL — ABNORMAL HIGH (ref 70–99)
Glucose-Capillary: 191 mg/dL — ABNORMAL HIGH (ref 70–99)

## 2010-06-10 LAB — DIFFERENTIAL
Basophils Absolute: 0 10*3/uL (ref 0.0–0.1)
Basophils Absolute: 0 K/uL (ref 0.0–0.1)
Basophils Absolute: 0 10*3/uL (ref 0.0–0.1)
Basophils Relative: 0 % (ref 0–1)
Eosinophils Absolute: 0 10*3/uL (ref 0.0–0.7)
Eosinophils Absolute: 0 K/uL (ref 0.0–0.7)
Eosinophils Relative: 0 % (ref 0–5)
Eosinophils Relative: 0 % (ref 0–5)
Lymphocytes Relative: 8 % — ABNORMAL LOW (ref 12–46)
Lymphocytes Relative: 15 % (ref 12–46)
Lymphs Abs: 0.7 K/uL (ref 0.7–4.0)
Lymphs Abs: 1 10*3/uL (ref 0.7–4.0)
Lymphs Abs: 1 10*3/uL (ref 0.7–4.0)
Monocytes Absolute: 0.5 K/uL (ref 0.1–1.0)
Monocytes Absolute: 0.4 10*3/uL (ref 0.1–1.0)
Monocytes Relative: 5 % (ref 3–12)
Monocytes Relative: 6 % (ref 3–12)
Neutro Abs: 45.1 10*3/uL — ABNORMAL HIGH (ref 1.7–7.7)
Neutro Abs: 7.9 K/uL — ABNORMAL HIGH (ref 1.7–7.7)
Neutro Abs: 5.1 10*3/uL (ref 1.7–7.7)
Neutrophils Relative %: 87 % — ABNORMAL HIGH (ref 43–77)
Neutrophils Relative %: 93 % — ABNORMAL HIGH (ref 43–77)
Smear Review: ADEQUATE

## 2010-06-10 LAB — FUNGUS CULTURE W SMEAR: Fungal Smear: NONE SEEN

## 2010-06-10 LAB — CBC
HCT: 47.5 % — ABNORMAL HIGH (ref 36.0–46.0)
HCT: 51.2 % — ABNORMAL HIGH (ref 36.0–46.0)
HCT: 45.2 % (ref 36.0–46.0)
HCT: 49.1 % — ABNORMAL HIGH (ref 36.0–46.0)
Hemoglobin: 11.9 g/dL — ABNORMAL LOW (ref 12.0–15.0)
Hemoglobin: 14.4 g/dL (ref 12.0–15.0)
Hemoglobin: 15.4 g/dL — ABNORMAL HIGH (ref 12.0–15.0)
Hemoglobin: 15.6 g/dL — ABNORMAL HIGH (ref 12.0–15.0)
Hemoglobin: 16.3 g/dL — ABNORMAL HIGH (ref 12.0–15.0)
Hemoglobin: 14.8 g/dL (ref 12.0–15.0)
Hemoglobin: 15.8 g/dL — ABNORMAL HIGH (ref 12.0–15.0)
MCHC: 31.8 g/dL (ref 30.0–36.0)
MCHC: 32 g/dL (ref 30.0–36.0)
MCHC: 32.1 g/dL (ref 30.0–36.0)
MCHC: 32.5 g/dL (ref 30.0–36.0)
MCHC: 34.5 g/dL (ref 30.0–36.0)
MCV: 95.5 fL (ref 78.0–100.0)
MCV: 95.8 fL (ref 78.0–100.0)
MCV: 96.8 fL (ref 78.0–100.0)
Platelets: 183 10*3/uL (ref 150–400)
RBC: 3.81 MIL/uL — ABNORMAL LOW (ref 3.87–5.11)
RBC: 4.07 MIL/uL (ref 3.87–5.11)
RBC: 4.61 MIL/uL (ref 3.87–5.11)
RBC: 4.62 MIL/uL (ref 3.87–5.11)
RBC: 5.03 MIL/uL (ref 3.87–5.11)
RBC: 5.06 MIL/uL (ref 3.87–5.11)
RBC: 5.36 MIL/uL — ABNORMAL HIGH (ref 3.87–5.11)
RBC: 4.67 MIL/uL (ref 3.87–5.11)
RBC: 5.1 MIL/uL (ref 3.87–5.11)
RDW: 20.6 % — ABNORMAL HIGH (ref 11.5–15.5)
RDW: 20.8 % — ABNORMAL HIGH (ref 11.5–15.5)
RDW: 21.1 % — ABNORMAL HIGH (ref 11.5–15.5)
RDW: 21.8 % — ABNORMAL HIGH (ref 11.5–15.5)
RDW: 20.7 % — ABNORMAL HIGH (ref 11.5–15.5)
WBC: 12.5 10*3/uL — ABNORMAL HIGH (ref 4.0–10.5)
WBC: 7.9 10*3/uL (ref 4.0–10.5)
WBC: 9 10*3/uL (ref 4.0–10.5)
WBC: 9.1 10*3/uL (ref 4.0–10.5)
WBC: 9.7 10*3/uL (ref 4.0–10.5)

## 2010-06-10 LAB — URINE CULTURE
Colony Count: NO GROWTH
Culture: NO GROWTH

## 2010-06-10 LAB — CULTURE, BLOOD (ROUTINE X 2)

## 2010-06-10 LAB — BASIC METABOLIC PANEL
BUN: 80 mg/dL — ABNORMAL HIGH (ref 6–23)
CO2: 21 mEq/L (ref 19–32)
Chloride: 92 mEq/L — ABNORMAL LOW (ref 96–112)
Chloride: 97 mEq/L (ref 96–112)
Creatinine, Ser: 5.67 mg/dL — ABNORMAL HIGH (ref 0.4–1.2)
Creatinine, Ser: 10.61 mg/dL — ABNORMAL HIGH (ref 0.4–1.2)
GFR calc Af Amer: 9 mL/min — ABNORMAL LOW (ref >60)
GFR calc Af Amer: 5 mL/min — ABNORMAL LOW (ref >60)
GFR calc Af Amer: 6 mL/min — ABNORMAL LOW (ref >60)
GFR calc non Af Amer: 4 mL/min — ABNORMAL LOW (ref >60)
GFR calc non Af Amer: 5 mL/min — ABNORMAL LOW (ref >60)
Glucose, Bld: 173 mg/dL — ABNORMAL HIGH (ref 70–99)
Potassium: 5 mEq/L (ref 3.5–5.1)
Potassium: 5.2 mEq/L — ABNORMAL HIGH (ref 3.5–5.1)
Sodium: 132 mEq/L — ABNORMAL LOW (ref 135–145)
Sodium: 136 mEq/L (ref 135–145)

## 2010-06-10 LAB — BODY FLUID CULTURE: Culture: NO GROWTH

## 2010-06-10 LAB — POCT CARDIAC MARKERS
CKMB, poc: 2.6 ng/mL (ref 1.0–8.0)
Myoglobin, poc: >500 ng/mL (ref 12–200)
Troponin i, poc: <0.05 ng/mL (ref 0.00–0.09)

## 2010-06-10 LAB — LACTIC ACID, PLASMA: Lactic Acid, Venous: 2.1 mmol/L (ref 0.5–2.2)

## 2010-06-10 LAB — URINE MICROSCOPIC-ADD ON

## 2010-06-10 LAB — SEDIMENTATION RATE: Sed Rate: 23 mm/hr — ABNORMAL HIGH (ref 0–22)

## 2010-06-10 LAB — POTASSIUM: Potassium: 5.3 meq/L — ABNORMAL HIGH (ref 3.5–5.1)

## 2010-06-10 LAB — CARDIAC PANEL(CRET KIN+CKTOT+MB+TROPI)
Relative Index: INVALID (ref 0.0–2.5)
Total CK: 44 U/L (ref 7–177)
Troponin I: 0.32 ng/mL — ABNORMAL HIGH (ref 0.00–0.06)

## 2010-06-10 LAB — TROPONIN I: Troponin I: 0.24 ng/mL — ABNORMAL HIGH (ref 0.00–0.06)

## 2010-06-10 LAB — PHOSPHORUS
Phosphorus: 8.4 mg/dL — ABNORMAL HIGH (ref 2.3–4.6)
Phosphorus: 5.5 mg/dL — ABNORMAL HIGH (ref 2.3–4.6)

## 2010-06-10 LAB — PROTIME-INR: INR: 1.1 (ref 0.00–1.49)

## 2010-06-10 LAB — BRAIN NATRIURETIC PEPTIDE: Pro B Natriuretic peptide (BNP): >3200 pg/mL — ABNORMAL HIGH (ref 0.0–100.0)

## 2010-06-10 LAB — HIGH SENSITIVITY CRP: CRP, High Sensitivity: 193.9 mg/L — ABNORMAL HIGH

## 2010-06-10 LAB — MAGNESIUM: Magnesium: 2.8 mg/dL — ABNORMAL HIGH (ref 1.5–2.5)

## 2010-06-10 LAB — CK TOTAL AND CKMB (NOT AT ARMC)

## 2010-07-08 NOTE — H&P (Signed)
HISTORY AND PHYSICAL EXAMINATION   March 28, 2009   Re:  [name], [name]                 DOB:  [birth year]   REASON FOR ADMISSION:  Gangrenous wound of the right foot for right  above knee amputation.   The patient is admitted for right above knee amputation.  This is a  pleasant 60 year old woman who had undergone a previous left above the  knee amputation.  She has a functioning right fem-pop bypass graft which  has been patent.  However, she developed progression of her distal  tibial disease based on an arteriogram back in March of 2009.  She has  had previous amputation of her right first and second toes and later  developed dry gangrene of the third toe and also some pain in the fifth  toe.  Of note, she is nonambulatory and in a wheelchair.  She does live  at home with some family members.  She underwent a ray amputation of the  right third toe on 03/01/2009.  This showed poor perfusion, however, we  attempted with dressing changes and also even attempted the [facility] but  there was really no significant improvement.  At the time of her  admission she was not interested in pursuing above the knee amputation  and was discharged to home.  She returned to the office on 03/28/2009  today for further evaluation and has had a significant progression of  the wound in her right foot.   PAST MEDICAL HISTORY:  1. Significant for chronic kidney disease which has been stable.  She      dialyzes Tuesdays, Thursdays and Saturdays.  2. She has a history of type 2 diabetes and is insulin dependent.  3. History of coronary artery disease followed by Dr. Algie Coffer.  4. She has a history of ischemic and dilated cardiomyopathy with an      ejection fraction of 25% based on previous echo.  5. In addition, she has iron deficiency anemia, secondary      hyperparathyroidism, COPD, gastroesophageal reflux disease and      history of stroke in 1998.   SOCIAL HISTORY:  She is divorced.   She does not have any children.  She  does not use tobacco.   FAMILY HISTORY:  There is no history of premature cardiovascular  disease.   ALLERGIES:  Are codeine, captopril and aspirin.   MEDICATIONS:  1. That we have listed from the dialysis center are Lidoderm patch      p.r.n. back pain.  2. Gabapentin 100 mg two caps p.o. b.i.d.  3. Digoxin 125 mcg 1 tablet p.o. daily.  4. Metoclopramide 10 mg p.o. t.i.d. a.c.  5. Metoprolol ER 25 mg half a tab on nonhemodialysis days.  6. Neurontin 200 mg p.o. b.i.d.  7. Lantus insulin 30 units subcu q.h.s.  8. NovoLog sliding scale.  9. Zofran 8 mg q.12 h p.r.n. nausea and vomiting p.o.  10.Nepro 240 mg p.o. t.i.d. p.r.n.  11.Reglan 5 mg p.o. t.i.d.  12.Advair 250/50 one puff  b.i.d.  13.Levothyroxine 50 mcg p.o. daily.  14.Phenergan 25 mg suppository p.r.n. q.6 h.  15.Omeprazole 20 mg p.o. daily.  16.MiraLAX 17 grams daily in 8 ounces of water.  17.Nephro-Vite one p.o. daily.   REVIEW OF SYSTEMS:  CARDIOVASCULAR:  She has had no chest pain, chest  pressure, palpitations or arrhythmias.  GENERAL:  She has had no recent weight loss, weight gain or  problem with  her appetite.  PULMONARY:  She has had no productive cough, bronchitis, asthma or  wheezing.  GI:  She has had no recent change in her bowel habits.  GU:  She makes minimal urine.  VASCULAR:  She has had no history of DVT or phlebitis.  NEUROLOGIC:  She has had no dizziness, blackouts, headaches or seizures.  MUSCULOSKELETAL:  She does have a history of arthritis and gout in her  right foot.  Psychiatric, ENT, hematologic review of systems is unremarkable.   PHYSICAL EXAMINATION:  General:  This is a pleasant 60 year old woman  who appears her stated age.  Blood pressure is 142/81, heart rate is 88,  temperature 98.  Lungs:  Clear bilaterally to auscultation without  rales, rhonchi or wheezing.  Cardiovascular:  She has a regular rate and  rhythm without murmur appreciated.   I do not detect any carotid bruits.  She has palpable femoral pulse on the right with a diminished femoral  pulse on the left.  I cannot palpate a popliteal or pedal pulses in the  right foot.  Abdomen:  Soft and nontender with normal pitched bowel  sounds and no masses appreciated.  Musculoskeletal:  She has a left  above the knee amputation.  She has gangrenous changes of the knee  amputation site of the right fourth toe with ischemic changes now on the  fifth toe.  Neurological:  She has no focal weakness.   IMPRESSION:  This patient presents with nonhealing wound on the right  foot with no options for revascularization.  She has severe tibial  occlusive disease.  I think the safest option is to proceed with primary  above the knee amputation.  She is agreeable to proceed at this time.  We will have physical therapy and rehab see her postoperatively.  She  will continue on her normal dialysis schedule which is Tuesdays,  Thursdays and Saturdays.     [name]. [name], M.D.  Electronically Signed   CSD/MEDQ  D:  03/28/2009  T:  03/29/2009  Job:  [id number]

## 2010-07-08 NOTE — H&P (Signed)
NAMEFRANCHESCA, VENEZIANO                 ACCOUNT NO.:  [id number]   MEDICAL RECORD NO.:  [id number]          PATIENT TYPE:  INP   LOCATION:  0102                         FACILITY:  [facility]   PHYSICIAN:  Isidor Holts, M.D.  DATE OF BIRTH:  1951/02/21   DATE OF ADMISSION:  04/08/2007  DATE OF DISCHARGE:                              HISTORY & PHYSICAL   CHIEF COMPLAINT:  Abdominal pain and vomiting since a.m. of April 07, 2007.   HISTORY OF PRESENT ILLNESS:  This is a 60 year old female. For past  medical history, see below.  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  According to  the patient, in the a.m. of April 07, 2007, she developed the above  symptoms.  She describes the pain as generalized on and off, associated  with vomiting.  She has vomited several times and is unable to keep food  down.  She denies diarrhea, fevers or chills.  She denies ingestion of  any unusual foods.  Her brother, who is residing with her and her  mother, called EMS.   PAST MEDICAL HISTORY:  1. Peripheral vascular disease.  2. Left foot gangrene, status post left AKA on December 20, 2006.  3. End-stage renal disease on hemodialysis on Tuesday, Thursday and      Saturday.  4. Insulin-requiring diabetes mellitus.  5. Gastroparesis.  6. Clotted left forearm AV graft, status post thrombectomy with      insertion of new segment of graft to brachial vein on March 04, 2007.  7. Status post ultrasound-guided placement of right internal jugular      Diatek catheter on March 31, 2007.  8. History of CVA in 1998.  9. History of psychosis.  10.Anemia of chronic disease.  11.Coronary artery disease, status post myocardial infarction,      ejection fraction 20-25%.  12.Pulmonary hypertension.  13.Secondary hyperparathyroidism.  14.COPD.  15.Bronchial asthma.  16.Obstructive sleep apnea syndrome.  17.Morbid obesity.  18.Dyslipidemia.   MEDICATIONS:  1. Lopressor 12.5 mg p.o. nightly.  2. Reglan 10 mg p.o. in the morning and nightly.  3. Renagel 800 mg p.o. t.i.d.  4. Zocor 40 mg p.o. nightly.  5. Prilosec 30 mg p.o. daily.  6. Neurontin 100 mg p.o. t.i.d.  7. Wellbutrin 150 mg p.o. daily.  8. Nephro-Vite one p.o. daily.  9. Zyprexa 2.5 mg p.o. nightly.  10.Ambien 5 mg p.o. nightly.   ALLERGIES:  ENTERIC COATED ASPIRIN, CAPTOPRIL AND CODEINE.   REVIEW OF SYSTEMS:  As per HPI and chief complaint, otherwise negative.   SOCIAL HISTORY:  The patient currently resides with her mother.  She is  a nonsmoker and nondrinker.  She has no history of drug abuse.   FAMILY HISTORY:  Noncontributory.   PHYSICAL EXAMINATION:  VITAL SIGNS:  Temperature 97.1, pulse 100 per  minute and regular, respirations 20, BP 171/55, pulse oximetry 96% on 2  L of oxygen.  GENERAL:  At the time  of this examination, the patient was not in  obvious acute distress.  However, on arrival in the emergency room,  reportedly, the patient had been moaning, but responded to analgesics.  She is now drowsy, but easily arousable, communicative, not short of  breath at rest.  HEENT:  No pallor, no jaundice or conjunctival injection.  Mucous  membranes appear dry.  NECK:  Supple.  JVP not seen.  No palpable lymphadenopathy, no palpable  goiter.  CHEST:  Clear to auscultation, no wheezes or crackles. The patient has  Diatek catheter in right subclavian location, right upper chest wall.  CARDIAC:  S1, S2 heard, normal and regular, no murmurs.  ABDOMEN:  Morbidly obese, soft, diffusely tender, no guarding.  Unable  to palpate organs.  Bowel sounds are heard.  EXTREMITIES:  The patient's left lower extremity AKA stump appears quite  unremarkable.  Right lower extremity, however, has stasis eczematous  changes.  In addition, the patient does have an ischemic-looking second  right  toe.  Peripheral pulses are poorly palpable.  MUSCULOSKELETAL:  Not full examined, although the patient does have  orthostatic changes.  NEUROLOGIC:  No focal neurological deficit on gross examination.  SKIN:  The patient has an approximately 2 x 2-cm, sacral decubitus which  is about stage II. Significant fecal contamination is noted.   LABORATORY DATA AND X-RAY FINDINGS:  CBC with WBC 9.7, hemoglobin 14.7,  hematocrit 46.4, platelets 215.  Sodium 140, potassium 4.6, chloride 98,  CO2 28, BUN 21, creatinine 4.93, glucose 232.  Lipase 111, AST 45, ALT  19, Alk phos 111.   Chest x-ray/abdominal x-ray dated April 08, 2007, shows possible  fecal impaction, nonobstructive gas pattern, no free air, stable  cardiomegaly, shallow lung volumes.   ASSESSMENT/PLAN:  1. Acute pancreatitis.  Based on elevated lipase levels as well as the      patient's symptomatology, with possible concomitant      gastritis/gastroparesis.  We will manage with bowel rest, gentle      intravenous fluid hydration.  Proton pump inhibitor treatment,      analgesics and Reglan.   1. End-stage renal disease.  We shall reinstate patient on regular      hemodialysis schedule.  For this purpose, nephrology will be      consulted.  It is likely the patient may need to be transferred to      [facility] for this.   1. Diabetes mellitus.  This is uncontrolled.  The patient will be      managed with sliding scale insulin coverage for now.   1. Peripheral vascular disease.  Right second toe looks gangreneous.      We will consult vascular surgeon in a.m.   1. Chronic obstructive pulmonary disease/asthma.  The patient is      stable at present.  Bronchodilators p.r.n.   1. Coronary artery disease.  This is asymptomatic.   1. History of obstructive sleep apnea.  We shall continue nocturnal      CPAP under the respiratory supervision.   1. Sacral decubitus.  This is 2 x 2-cm and is almost healed.  We shall       consult wound care team for local care.   1. Uncontrolled hypertension.  For now, will utilize Catapres-TTS      patch.   Further management will depend on clinical course.      Isidor Holts, M.D.  Electronically Signed     CO/MEDQ  D:  04/08/2007  T:  04/08/2007  Job:  [id number]

## 2010-07-08 NOTE — Discharge Summary (Signed)
[name], [name]                 ACCOUNT NO.:  [id number]   MEDICAL RECORD NO.:  [id number]          PATIENT TYPE:  AMB   LOCATION:  SDS                          FACILITY:  [facility]   PHYSICIAN:  Marcellus Scott, MD     DATE OF BIRTH:  May 13, 1950   DATE OF ADMISSION:  12/08/2007  DATE OF DISCHARGE:  12/21/2007                               DISCHARGE SUMMARY   ADDENDUM TO DISCHARGE SUMMARY:  This is an addendum to the interim  discharge summary that was done by Dr. Ardyth Harps on December 13, 2007,  and will update events since then including discharge diagnosis and  medications.   PRIMARY MEDICAL DOCTOR:  Maryelizabeth Rowan, M.D.   DISCHARGE DIAGNOSES:  1. Coagulase-negative Staphylococcus bacteremia with L4-L5 diskitis.  2. End-stage renal disease on regular hemodialysis on Tuesday,      Thursdays and Saturdays.  3. Chronic anemia - stable.  4. Secondary hyperparathyroidism.  5. Diabetes mellitus.  6. Gastroesophageal reflux disease.  7. Hypertension.  8. COPD on home O2  9. Obstructive sleep apnea syndrome.  10.Ischemic Cardiomyopathy, with ejection fraction of 30%.   DISCHARGE MEDICATIONS:  1. Vancomycin 1 gram with hemodialysis on Tuesdays, Thursdays and      Saturdays.  This was started on December 10, 2007.  She is to      complete a total of 6 weeks course of vancomycin from December 10, 2007.  The vancomycin levels are to be checked periodically and      dosing adjustment to be made per pharmacy.  Pre-hemodialysis      vancomycin levels to be drawn tomorrow, that is December 22, 2007.  2. Zyprexa 2.5 mg p.o. q.h.s.  3. Bupropion 150 mg p.o. daily.  4. Simvastatin 40 mg p.o. q.h.s.  5. Gabapentin 100 mg p.o. t.i.d.  6. Nephro-Vite one p.o. daily.  7. Omeprazole 20 mg p.o. daily.  8. MiraLax 17 grams p.o. daily with 8 ounces of water.  9. NovoLog sliding scale insulin t.i.d. per nursing home protocol.  10.Reglan 10 mg p.o. a.c. and h.s.  11.Metoprolol XL 12.5 mg p.o.  q.h.s.  12.Lantus 16 units subcutaneously q.h.s.  13.Renagel 1600 mg p.o. t.i.d.  14.Nepro with carb vanilla liquid 237 ml p.o. b.i.d.  15.Venofer 100 mg IV on Tuesdays across dialysis.  Duration to be      determined by the renal MD at dialysis.  16.Percocet 5/325 mg tablet, 1-2 tablets p.o. q.4 h p.r.n.  17.Aronesp 60 mcg IV on Tuesdays across dialysis.  18.Hectorol 2 mcg IV on Tuesdays, Thursdays and Saturdays at dialysis.  19.Dulcolax 10 mg per rectal p.r.n.  20.Oxygen via nasal cannula at 2-3 liters per minute q.h.s. and p.r.n.   PROCEDURES:  1. X-ray of the abdomen on December 14, 2007.  Impression:  Very large      amount of fecal matter in the rectum.  2. Chest x-ray on October 23.  Impression:  No complications evident      after new dialysis catheter placement.  Distal lumen tip lies in      the upper  right atrium.  3. A 2-D echocardiogram on October 21.  Summary:  Limited study due to      poor acoustic windows.  Left ventricle moderately dilated.  Overall      left ventricle systolic function markedly decreased with ejection      fraction of 30%.  There was severe diffuse left ventricular      hypokinesis, akinesis of the inferior wall.  Mild-to-moderate      aortic valve stenosis.  Impression:  Although no vegetations were      identified, possibility cannot be ruled out on the basis of this      study.  When I compare this to the echocardiogram of December 21, 2006, the patient also had left ventricle ejection fraction between      25-35% with akinesis of basal - mid inferior wall and akinesis of      basal - mid inferior posterior wall.   PERTINENT LABORATORY DATA:  His renal panel on October 27 with BUN of  79, creatinine 8.79, phosphorus of 8.6, calcium 8.7, CBCs with  hemoglobin 10.2, hematocrit 32.5, white blood cell 9.6, platelets 367.   CONTINUED CONSULTATIONS:  1. Renal Team.  2. ID, Dr. Clydie Braun.   HOSPITAL COURSE/PATIENT DISPOSITION:  1.  Coagulase-negative staph bacteremia with L4-L5 diskitis.  catheter      was exchanged on December 13, 2007.  She had a new left IJ Diatek      catheter placed for dialysis.  The patient indicates that her back      pain is slowly decreasing.  However, she currently still has 7/10      pain.  Her echo did not reveal vegetations.  ID recommended total 6      weeks of intravenous vancomycin across dialysis from the date it      was started, that is December 10, 2007.  Once she has completed her      vancomycin, her spine will have to be the imaged to evaluate for      the progress of diskitis.  2. End-stage renal disease.  The renal team continued to follow the      patient.  She will continue regular hemodialysis on Tuesday,      Thursdays and Saturdays.  She will get her vancomycin and Venofer      across dialysis.  3. Chronic anemia which has remained stable.  She is to continue the      Venofer.  Also, for Aranesp 60 mcg IV on Tuesdays across dialysis.      Continue Renagel.  4. Diabetes which is insulin requiring.  Adjustments are being made to      the Lantus and sliding scale insulin.  5. Gastroesophageal reflux disease.  Continued PPIs.  6. Hypertension, controlled off medications.  7. Cardiomyopathy with ejection fraction of 30%.  Consider outpatient      cardiology evaluation as deemed necessary.  8. COPD which is stable.  Continue oxygen as indicated above.   Initially, the patient had declined admission to a skilled nursing  facility and had intended to go home.  However, subsequently she agreed  to a skilled nursing facility which is a much safe discharge plan for  her.      Marcellus Scott, MD  Electronically Signed     AH/MEDQ  D:  12/21/2007  T:  12/21/2007  Job:  [id number]   cc:   Maryelizabeth Rowan, M.D.  Mick Sell, MD  Renal Team

## 2010-07-08 NOTE — Consult Note (Signed)
[name], [name]                 ACCOUNT NO.:  [id number]   MEDICAL RECORD NO.:  [id number]          PATIENT TYPE:  INP   LOCATION:  5503                         FACILITY:  [facility]   PHYSICIAN:  Jordan Hawks. Elnoria Howard, MD    DATE OF BIRTH:  1950/07/26   DATE OF CONSULTATION:  10/19/2006  DATE OF DISCHARGE:                                 CONSULTATION   GI CONSULTATION:   REASON FOR CONSULTATION:  Nausea and vomiting.   HISTORY OF PRESENT ILLNESS:  This is a 60 year old female with a past  medical history of end stage renal disease secondary to diabetes,  coronary artery disease, ischemic dilated cardiomyopathy, hypertension,  iron-deficiency anemia, secondary hyperparathyroidism, COPD, pulmonary  hypertension, depression/anxiety, history of CVA, status post left knee  fracture with repair, status post rotator cuff repair in 2003, and  severe peripheral vascular disease with left tibial bypass.  She was  admitted to the hospital with complaints of shortness of breath.  Over  the course of her hospitalization, it was felt that her shortness of  breath was secondary to her CHF.  She has been optimized during her  hospitalization and she has progressed in regards to this etiology.  However, she has complained of having nausea and vomiting.  At home, she  does have intermittent nausea and vomiting; however, it appears that  during this hospitalization she has had significant amount of symptoms.  Currently, she is undergoing calorie count.  It was initially felt that  it was secondary to a medication and she did respond earlier in the  hospitalization to routine antiemetics.  However, at this time, she has  wafting and waning benefit from Phenergan.  She was also noted to have  anxiety disorder and Celexa was started.  She was also given a dose of  Ativan, which did help with her nausea and vomiting.  However, it was  felt that she was having withdrawal symptoms from her Ativan.  Patient  does  also state having some nausea issues when she coughs excessively.   Past medical history and past surgical history are as stated above.   FAMILY HISTORY:  Noncontributory.   SOCIAL HISTORY:  Patient lives with her mother.  Negative alcohol,  tobacco or illicit drug use.   REVIEW OF SYSTEMS:  As stated above in history of present illness,  otherwise negative.   MEDICATIONS:  1. Ativan 0.5 mg p.o. b.i.d.  2. Celexa 10 mg p.o. daily.  3. Coumadin 3 mg p.o. daily.  4. Nephrovite.  5. NovoLog insulin.  6. Protonix 40 mg IV.  7. Reglan 10 mg p.o. q.6 hours.  8. Phenergan 25 mg q.8 hours p.r.n.  9. Ventolin 2.5 mg inhaler q.4 hours p.r.n.  10.Vistaril 25 mg p.o. q.6 hours p.r.n.   PHYSICAL EXAMINATION:  VITAL SIGNS:  Blood pressure 130/82, heart rate  84, respirations 20, temperature 98.2, pulse oximetry 98% on room air.  GENERAL:  The patient is ill appearing.  She states that her stomach  feels very bloated.  HEENT:  Normocephalic, atraumatic.  Extraocular muscles intact.  NECK:  Supple.  No lymphadenopathy.  LUNGS:  Clear to auscultation bilaterally  on the anterior field.  ABDOMEN:  Soft, nontender, nondistended, obese.  Positive bowel sounds.  EXTREMITIES:  No clubbing, cyanosis or edema.   LABORATORY DATA:  White blood cell count 11.9, hemoglobin 14.1,  platelets 345.  Sodium 132,  potassium 4.0, chloride 93, CO2 25, BUN 53,  creatinine 10.0, glucose 109.   IMPRESSION:  1. Nausea and vomiting.  2. Multiple medical issues.  I believe the patient's nausea and      vomiting is multifactorial.  I am unable to define any etiology for      her current symptoms.  She is certainly on a number of medications      and she carries a diagnosis of gastroparesis.  However, I do not      see any formal studies in E Chart.  She has not responded to Reglan      and she has intermittent benefit with the current antiemetics.   PLAN:  1. Plan at this time is to hold the Reglan.  2.  Perform a gastric emptying scan.  3. Add Zofran to her regimen only after she obtains a gastric emptying      scan, as Zofran can result in a false positive result in a gastric      emptying scan.  Otherwise support care for the patient.      Jordan Hawks Elnoria Howard, MD  Electronically Signed     PDH/MEDQ  D:  10/19/2006  T:  10/19/2006  Job:  [id number]   cc:   Terrial Rhodes, M.D.

## 2010-07-08 NOTE — Consult Note (Signed)
[name], KRELL NO.:  [id number]   MEDICAL RECORD NO.:  [id number]          PATIENT TYPE:  OBV   LOCATION:  6740                         FACILITY:  [facility]   PHYSICIAN:  Christell Faith, MD   DATE OF BIRTH:  September 11, 1950   DATE OF CONSULTATION:  07/17/2008  DATE OF DISCHARGE:                                 CONSULTATION   Consultation is requested by Dr. Terrial Rhodes for tachycardia.   PRIMARY CARE PHYSICIAN:  Maryelizabeth Rowan, MD   For cardiology, the patient has been seen by Dr. Lewayne Bunting while she  was an inpatient in 2009.   HISTORY OF PRESENT ILLNESS:  This is a 60 year old African American  female with multiple medical problems detailed below who was admitted  tonight with weakness, vomiting, and failure to thrive.  She lives with  her mother here in [place] and other than going to dialysis sessions,  she is basically sedentary at home.  She has missed at least one recent  dialysis session and is now most likely uremic consistent with prior  hospital admissions.  In fact the case management team has been  consulted due to the frequency and overall number of admissions this  lady has had recently.  Regardless, while on hemodialysis tonight, she  has been having frequent runs of SVT.  The patient is unaware of  palpitations presently, but admits to having felt palpitations in the  past.  She has chronic multifactorial shortness of breath, which is  worse with exertion.  She denies chest pain.   PAST MEDICAL HISTORY:  1. End-stage renal disease on hemodialysis Tuesday, Thursday, and      Saturday.  2. Oxygen-dependent lung disease probably a combination of obstructive      sleep apnea, obesity hypoventilation, and possibly COPD.  3. Coronary artery disease with ischemic cardiomyopathy.  In November      2009, she suffered VF arrest while on dialysis and underwent a      catheterization by Dr. Algie Coffer, which revealed occluded right  coronary, occluded left circumflex, patent LAD, and ejection      fraction of 15-20%.  She was seen by Dr. Lewayne Bunting at that time      who recommend conservative therapy given her multiple comorbidities      including an active infection at that time.  4. Diabetes mellitus, type 2 with a history of gastroparesis,      peripheral vascular disease, and pancreatitis.  5. Peripheral vascular disease with history of right femoral-to-tibial      bypass surgery in 2005 and history of left above-the-knee      amputation.  6. 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  7. Chronic anemia, treated with erythropoietin.  8. Secondary hyperparathyroidism.   SOCIAL HISTORY:  She was previously in an assisted living center.  She  now lives with her mother in [place].  She is on disability.  She is  divorced, is a nonsmoker, nondrinker.   FAMILY HISTORY:  Mother and father both have a history of diabetes.  REVIEW OF SYSTEMS:  Positive for decreased visual acuity probably  related to retinopathy, positive for dyspnea on exertion, orthopnea,  PND, chronic cough, chronic fatigue, overall weakness, depression,  chronic pain, nausea, vomiting, and constipation.   ALLERGIES:  ASPIRIN, CAPTOPRIL, and CODEINE.   MEDICATIONS:  1. Lantus 20 units at bedtime.  2. Plavix 75 mg p.o. daily.  3. Zocor 40 mg p.o. daily.  4. Toprol-XL 12.5 mg p.o. daily.  5. Renagel.  6. Combivent.  7. Albuterol.  8. Phenergan.  9. Ambien.  10.Bupropion.  11.Colace.  12.Nexium.  13.MiraLax.  14.Percocet.  15.Zyprexa.  16.Neurontin.  17.Nephro-Vite.   PHYSICAL EXAMINATION:  VITAL SIGNS:  Temperature 96.8, pulse 77,  respiratory rate 24, blood pressure 106/36, saturation 99% on 2 L.  GENERAL:  This is a depressed African American female.  She is rather  listless.  She speaks only in a whisper, often not responding at all to  verbal questions.  HEENT:  Her dentition is poor.   Pupils are round.  Sclerae are clear.  NECK:  Supple.  The right IJ appears distended probably 8 cm above the  clavicle.  Left IJ has a tunneled dialysis catheter.  CHEST:  Chest wall is nontender to palpation.  HEART:  Rhythm is regular.  The rate is controlled.  There is no  murmurs.  On digital pulse exam, there is a left AKA.  The right foot is  cool with diminished pulses.  Both hands are cool with palpable radial  pulses.  LUNGS:  Crackles in the bases probably third to the way up bilaterally.  ABDOMEN:  Obese, soft, nontender.   DIAGNOSTIC TESTS:  Chest x-ray shows cardiomegaly with pulmonary  vascular congestion.  EKG shows sinus rhythm with a rate of 76 beats per  minute, left ventricular hypertrophy, interventricular conduction delay,  and QTC of 504.  Telemetry reveals frequent runs of SVT.   LABORATORY DATA:  White blood cells 7.9, hemoglobin 14, platelets 219.  Sodium 140, potassium 3.4, BUN 57, creatinine 9.2, glucose 123.   IMPRESSION:  A 60 year old African American female with end-stage renal  disease and severe ischemic cardiomyopathy who was admitted with failure  to thrive and uremia.  She is now having asymptomatic, but frequent runs  of supraventricular tachycardia on the monitor while undergoing a  dialysis session.  By physical exam, she appears to be in mild  congestive heart failure.   PLAN:  1. We would recommend increasing her beta-blocker to Toprol-XL 25 mg      daily, first dose now.  2. Check TSH.  3. Check magnesium.  4. Continue Plavix.  Of note, the patient has an aspirin allergy.  5. For congestive heart failure, the patient probably does not have      enough blood pressure to consider adding an ACE inhibitor.      Additionally, she reports an allergy to captopril, therefore we      recommend ongoing beta-blocker therapy with intermittent      hemodialysis for volume control.  6. The patient remains a poor candidate for device therapy and  would      continue with aggressive medical management of her heart disease as      described above.   Thank you for consultation.      Christell Faith, MD  Electronically Signed     NDL/MEDQ  D:  07/17/2008  T:  07/18/2008  Job:  [id number]

## 2010-07-08 NOTE — H&P (Signed)
HISTORY AND PHYSICAL EXAMINATION   December 14, 2006   Re:  [name], [name]                 DOB:  15-May-1950   REASON FOR ADMISSION:  Gangrene of the left foot.   HISTORY:  This is a pleasant 60 year old woman well known to [facility].  She  has had a previous left femoral and a posterior tibial artery bypass  with a composite 6 mm PTFE and vein graft in November of 2004.  This  subsequently occluded, and we have been following her with stable  claudication on the left side.  She has also had a previous right fem to  below knee pop bypass with a vein graft in February of 2005.  She  developed a wound on her left foot which has gradually progressed.  She  had been reluctant to consider amputation.  She was not a candidate for  revascularization.  However, the wound on the foot has progressed, and  now she has developed significant discoloration of the second, third and  fourth toes with dry gangrene.  She is admitted for left above-the-knee  amputation.   PAST MEDICAL HISTORY:  1. Significant for chronic renal insufficiency, and she dialyzes      Tuesdays, Thursdays and Saturdays.  2. In addition, she has insulin-dependent diabetes.  3. History of neuropathy.  4. History of retinopathy.  5. History of gastroparesis.  6. History of coronary artery disease with previous myocardial      infarctions.  7. History of ischemic and dilated cardiomyopathy with an ejection      fraction of 20% to 25%.  8. Hypertension.  9. Anemia.  10.Secondary hyperparathyroidism.  11.COPD.  12.History of pulmonary hypertension.  13.History of asthma.  14.History of depression.  15.History of anxiety.  16.History of gastroesophageal reflux disease.  17.Previous history of a stroke.   PAST SURGICAL HISTORY:  Significant for bilateral lower extremity  bypasses as described above.  She has also had toe amputations on both  sides.   MEDICATIONS:  1. Nephro-Vite 1 p.o. daily.  2. Lanoxin  0.125 mg on Tuesdays, Thursdays and Saturdays after      dialysis.  3. Lopressor 12.5 mg p.o. b.i.d.  4. Coumadin 4 mg p.o. nightly.  This was held on December 14, 2006.  5. Zocor 40 mg p.o. daily.  6. Protonix 40 mg p.o. nightly.  7. Reglan 10 mg 1 p.o. 15 minutes prior to every meal and at bedtime.  8. PhosLo 667 mg 2 p.o. t.i.d. before meals.  9. Celexa 20 mg 1 p.o. nightly.  10.Zyprexa 5 mg p.o. daily.  11.Colace 200 mg p.o. nightly.  12.Ativan 0.5 mg p.o. q.12h. p.r.n. anxiety.  13.Claritin 10 mg p.o. daily.  14.Insulin 70/30, 46 units in the morning and 32 units in the p.m.  15.Phenergan 12.5 mg 1 p.o. q.6h. p.r.n. nausea and vomiting.   ALLERGIES:  CODEINE AND CAPTOPRIL AS WELL AS ASPIRIN.   SOCIAL HISTORY:  The patient is currently in a skilled nursing facility.  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  She denies any alcohol or tobacco use.   FAMILY HISTORY:  The mother is 33.  She has hypertension and a weak  heart.  Father died from complications of diabetes.  She is unaware of  any history of premature cardiovascular disease.   REVIEW OF SYSTEMS:  GENERAL:  She has had a poor appetite.  She has had  no recent weight loss or weight  gain.  She has had no recent fever that  she is aware of.  HEENT:  She has had some problems speaking and has had some generalized  weakness.  CARDIAC:  She has had no recent chest pain, chest pressure, palpitations  or arrhythmias.  PULMONARY:  She has had no recent bronchitis, asthma or wheezing.  GI:  She has had problems with nausea and vomiting as noted in her  previous admissions but none recently.  PSYCHIATRIC:  She has had significant depression and has been followed  by Dr. Jeanie Sewer.  GU, NEURO, ORTHO, SKIN, HEMATOLOGIC:  Unremarkable.   PHYSICAL EXAMINATION:  VITAL SIGNS:  Her blood pressure is 118/62, heart  rate 72, temperature 97.6.  I did not detect any carotid bruits.  LUNGS:  Clear bilaterally to auscultation.  CARDIAC:  She has a regular  rate and  rhythm.  ABDOMEN:  Soft and nontender.  PULSES:  She has palpable  femoral pulses.  I cannot palpate popliteal or pedal pulse in the left  side.  EXTREMITIES:  She has dry gangrene involving the second, third  and fourth toes of the left foot with a small amount of drainage.  There  is no significant erythema.  The right foot is warm and well perfused.  There are monophasic Doppler signals in the right foot.  She has a  healed great toe amputation on the right and great toe and fifth toe.   IMPRESSION:  This patient presents with a progressive, nonhealing wound  of the left foot with no options for revascularization.  Given her  markedly debilitated state and weak heart, I have recommended above-the-  knee amputation as the safest approach as I think there would be  significant risk of nonhealing at below-the-knee amputation.  She is  agreeable to proceed, and we will arrange this on a nondialysis day  which is December 22, 2006, after she has been off her Coumadin for  several days.  We have discussed the indications for the procedure and  the risks of nonhealing.  All of her questions were answered, and she is  agreeable to proceed.   [name]. [name], M.D.  Electronically Signed   CSD/MEDQ  D:  12/14/2006  T:  12/15/2006  Job:  [id number]   cc:   [facility]

## 2010-07-08 NOTE — H&P (Signed)
NAMEEARA, [name] NO.:  [id number]   MEDICAL RECORD NO.:  [id number]          PATIENT TYPE:  EMS   LOCATION:  MAJO                         FACILITY:  [facility]   PHYSICIAN:  Michelene Gardener, MD    DATE OF BIRTH:  December 10, 1950   DATE OF ADMISSION:  01/08/2008  DATE OF DISCHARGE:                              HISTORY & PHYSICAL   PRIMARY PHYSICIAN:  Maryelizabeth Rowan, M.D.   CHIEF COMPLAINT:  Generalized body aches and weakness and inability to  take care of herself at home.   HISTORY OF PRESENT ILLNESS:  This is a 60 year old African American  female with past medical history of multiple medical problems including  end-stage renal disease, ischemic cardiomyopathy, history of V-fib/V-  tac, diabetes and hypertension who presented with the above mentioned  complaint.  This patient has been in and out of the hospital very  frequently and she had a prolonged hospitalization where she was  discharged on October 28.  The patient was readmitted on October 31 with  V-tac/V-fib and was managed by cardiology at that time.  The patient was  sent home recently.   The plan for her was to go to a nursing home but the patient insisted to  go home at that time.  The patient found herself unable to perform her  daily activities.  She was feeling very weak.  She was not able to dress  herself and not able to prepare her meals or eat and she was complaining  of generalized weakness and body aches.  Family as well were not able to  take care of her at home.  They brought her to the hospital for further  evaluation.  Initial workup is negative except for decreased potassium.  Hospitalist service was called for further evaluation.   PAST MEDICAL HISTORY:  Significant for:  1. History of recent arrhythmia with ventricular tachycardia and      ventricular fibrillation arrest.  2. Ischemic cardiomyopathy with ejection fraction of 20%.  3. Coronary artery disease with occluded right  coronary artery and      left circumflex coronary artery with collateralization in place.  4. Peripheral vascular disease.  5. Morbid obesity.  6. End-stage renal disease and the patient on hemodialysis on Tuesday,      Thursday and Saturday.  7. Diabetes mellitus.  8. History of chronic obstructive pulmonary disease.  9. Obstructive sleep apnea.   PAST SURGICAL HISTORY:  Status post left below knee amputation   ALLERGIES:  ASPIRIN.   CURRENT MEDICATIONS:  1. Vancomycin 1 gram with hemodialysis on Tuesday, Thursday and      Saturday and that was started on October 17 and to be given for 6      weeks.  2. Zyprexa 2.5 mg p.o. at bedtime.  3. Bupropion 150 mg p.o. once daily.  4. Simvastatin 40 mg at bedtime.  5. Gabapentin 100 mg three times daily.  6. Nephro-Vite once daily.  7. Omeprazole 20 mg once a day.  8. MiraLax 17 grams daily as needed.  9. NovoLog sliding scale three times  daily.  10.Reglan 10 mg twice daily.  11.Metoprolol XL 12.5 mg at bedtime.  12.Lantus 60 units subcutaneously at bedtime.  13.Renagel 60 mg p.o. three times daily.  14.Nepro with carb vanilla liquid 237 mL p.o. twice daily.  15.Venofer 100 mg IV on Tuesdays with dialysis and duration to be      determined by nephrology.  16.Percocet 5/325 mg 1-2 tablets q.4 h as needed.  17.Aranesp 60 mcg IV on Tuesday with dialysis.  18.Hectorol 2 mcg IV on Tuesday, Thursday and Saturday with dialysis.  19.Dulcolax rectally as needed.  20.Oxygen 2-3 liters at a time and as needed   SOCIAL HISTORY:  She denied smoking, not an alcohol drinker and denied  recreational drugs.  The patient currently lives at home and does not  have children.   FAMILY HISTORY:  Positive for hypertension and diabetes.   REVIEW OF SYSTEMS:  CONSTITUTIONAL:  Positive for fatigability.  There  is no sweating.  There is no fever.  HEENT:  No blurred vision.  No  tinnitus.  RESPIRATORY:  No cough, no wheezes.  CARDIOVASCULAR:  No  chest  pain.  No shortness of breath.  GI:  No nausea or vomiting.  No  diarrhea.  GU:  No dysuria.  No hematuria.  No polyuria, no nocturia.  HEMATOLOGY:  No bruises, no bleeding.  ID:  No rash, no lesions.  NEUROLOGICAL:  No numbness, no tingling.  MUSCULOSKELETAL:  Positive for  generalized muscular pain.  The rest of the systems were reviewed and  they were negative.   PHYSICAL EXAMINATION:  VITAL SIGNS:  Temperature is 98.8, pulse 86,  respiratory rate 24, blood pressure is 103/58.  GENERAL APPEARANCE:  This is a middle-aged [place] American female not  in acute distress.  HEENT:  Her conjunctivae are pink.  Pupils are equal and reactive to  light.  There is no ptosis.  Hearing is intact.  There is no ear  discharge or infection.  There is no nose infection or bleeding.  Oral  mucosa is dry.  No pharyngeal erythema.  NECK:  Supple.  There is no JVD, no carotid bruit, no lymphadenopathy,  no thyroid enlargement or thyroid tenderness.  CARDIOVASCULAR:  S1, S2 are regular.  There are no murmurs, no gallops  and no thrills.  RESPIRATORY:  The patient has breathing between 16-18.  There are no  rales, no rhonchi, no wheezes.  ABDOMEN:  The abdomen is soft, nondistended.  No tenderness.  No  hepatosplenomegaly.  Bowel sounds are present.  LOWER EXTREMITIES:  Positive for left above knee amputation and right  toe amputation.  There is chronic skin discoloration.  There is no  edema.  NEUROLOGICAL:  Cranial nerves are intact.  There are no motor or sensory  deficits.   LAB RESULTS:  WBC 11.2, hemoglobin 10.9, hematocrit 35.0, platelet count  is 415.  Sodium 38, potassium 312, chloride 101, bicarb 26.  Glucose  218, BUN 31, creatinine 4.93, calcium 9.3, CK total is 19.  EKG is  normal sinus rhythm at 80 beats per minute with nonspecific findings and  no evidence of acute ischemia.   IMPRESSION:  1. Weakness.  2. Ischemic cardiomyopathy with ejection fraction of 20%.  3. History of  arrhythmia with V-tac and V-fib.  4. End-stage renal disease.  5. Peripheral vascular disease.  6. Diabetes mellitus type 2.  7. Morbid obesity.   PLAN:  This patient was recently in the hospital and was sent home.  Plan before  that was to send her to a nursing home facility.  The  patient refused at that time and she went home.  She was not able to  take care of herself at home and family was not able to.  She came in  with weakness and generalized muscular aches.  Will admit her to the  floor.  I will continue all of her home medications.  We will consult  nephrology in the morning to continue her hemodialysis and to reorder  medications that will be given with dialysis.  I will consult physical  therapy, occupational therapy and care management for possible  placement.  Otherwise, other medical conditions seem to be stable at the  present time.   Total assessment time is 1 hour.      Michelene Gardener, MD  Electronically Signed     NAE/MEDQ  D:  01/08/2008  T:  01/08/2008  Job:  [id number]   cc:   Maryelizabeth Rowan, M.D.

## 2010-07-08 NOTE — Assessment & Plan Note (Signed)
OFFICE VISIT   [name], [name]  DOB:  16-Jun-1950                                       02/14/2009  DVVOH#:[id number]   I saw patient in the office today concerning dry gangrene of her right  third toe.  This is a pleasant 60 year old woman who has undergone a  previous left above-the-knee amputation.  She has a known right fem-pop  bypass graft which on last evaluation was patent.  However, she had  significant progression of her distal tibial disease based on  arteriogram back in March of 2009.  She has had a previous amputation of  the right first and second toes.  She developed dry gangrene of the  third toe and also some pain in the fifth toe and was sent for vascular  evaluation.  Of note, she is nonambulatory and is in a wheelchair.  She  does live at home with some family members.  She has had no real pain  associated with a wound on her third toe.  She states she occasionally  get some mild pain in her fifth toe.  She has had no fever or chills.   PAST MEDICAL HISTORY:  1. Significant for chronic kidney disease which has been stable.  She      dialyzes Tuesdays, Thursdays, and Saturdays.  2. She has a history of type 2 diabetes, which is insulin dependent,      and has been stable on her current medications.  3. She also has a history of coronary artery disease and is followed      by Dr. Algie Coffer.  4. She has a history of ischemic and dilated cardiomyopathy with an      ejection fraction of 25% in the past.  I do not have a recent echo.  5. In addition, she has iron deficiency anemia, secondary      hyperparathyroidism, COPD, gastroesophageal reflux disease, history      of CVA in 1998.   ALLERGIES:  Codeine, captopril, and aspirin.   Her medications are documented on the medical history form in her chart.   On social history, she is divorced.  She does not have any children.  She does not smoke tobacco.   FAMILY HISTORY:  There is no history  of premature cardiovascular  disease.   REVIEW OF SYSTEMS:  CARDIOVASCULAR:  She had no chest pain, chest  pressure, palpitations or arrhythmias.  She is nonambulatory.  She has  had no history of rest pain in the right foot.  She has had a previous  stroke, as mentioned above.  She has had no history of DVT or phlebitis.  MUSCULOSKELETAL:  She does have a history of arthritis and also has a  history of gout in her right foot.  NEUROLOGIC:  She has had no recent  dizziness, blackouts, headaches or seizures.   PHYSICAL EXAMINATION:  This is a pleasant 60 year old woman who appears  her stated age.  Blood pressure is 142/81, heart rate is 88.  Temperature is 98.  Lungs are clear bilaterally to auscultation without  rales, rhonchi or wheezing.  On cardiovascular exam, I do not detect any  carotid bruits.  She has a regular rate and rhythm without murmur or  gallop appreciated.  She has a palpable right femoral pulse with a  diminished left femoral pulse.  I cannot palpate popliteal pedal pulses  on the right side.  Abdomen is soft and nontender with normal pitched  bowel sounds and no masses appreciated.  On musculoskeletal exam, she  has a left AKA.  On the right side, she has dry gangrene of the third  toe.  She has had her first and second toes amputated previously on the  right.  I do not see any ulcers on the fourth or fifth toes on the right  foot.  There is no significant swelling in the right foot.  She has a  monophasic posterior tibial and dorsalis pedis on the right with the  Doppler.  Neurologic exam:  She has no focal weakness or paresthesias.   This patient presents with dry gangrene of the third toe of the right  foot.  She has evidence of severe tibial occlusive disease distally and  really has no options for revascularization distally.  The only patent  vessel on a study over a year and a half ago was a small diseased  posterior tibial artery.  In addition, she would be  high risk for any  type of major revascularization procedure, given her multiple  comorbidities and cardiac history.   I think really the only option is to proceed with amputation of the  third toe on the right foot with the understanding that there is a  chance that this will not heal, and she could ultimately require more  proximal amputation.  I think the risk of leaving the wound for now and  simply trying to keep it dry with dressing changes and soaks are that  given her diabetes, she could develop a significant diabetic foot  infection.  She seems agreeable to proceed with toe amputation, despite  the risk of nonhealing, and I really do not see any other options.  She  would like to stay overnight and be dialyzed the next day, which I think  is reasonable.  Her surgery has been scheduled for January 7th, which is  a Friday.     [name]. [name], M.D.  Electronically Signed   CSD/MEDQ  D:  02/14/2009  T:  02/18/2009  Job:  [id number]   cc:   Terrial Rhodes, M.D.

## 2010-07-08 NOTE — H&P (Signed)
[name], BRADT NO.:  [id number]   MEDICAL RECORD NO.:  [id number]          PATIENT TYPE:  INP   LOCATION:  1825                         FACILITY:  [facility]   PHYSICIAN:  Michaelyn Barter, M.D. DATE OF BIRTH:  August 11, 1950   DATE OF ADMISSION:  01/31/2007  DATE OF DISCHARGE:                              HISTORY & PHYSICAL   PRIMARY CARE PHYSICIAN:  Maxwell Caul, M.D.   CHIEF COMPLAINT:  Sore on my butt.   HISTORY OF PRESENT ILLNESS:  [name] is a 60 year old female with past  medical history of end-stage renal disease, diabetes mellitus,  hypertension, who states that approximately 2 weeks ago she developed a  sore on her buttock region.  She indicates that she had been spending a  significant amount of time in bed which led to the development of the  sore.  Over the last couple of days,  this sore has become painful.  She  indicates that attempts were made to treat the sore at the facility she  came from.  Daily dressing changes were made; however, the sore did not  get any better and instead its overall appearance continued to progress.  She did not have any nausea.  No vomiting, no fevers or chills, no  additional complaints.   PAST MEDICAL HISTORY:  1. Gangrenous left foot.  2. Dysphagia.  3. End-stage renal disease on chronic hemodialysis.  4. Insulin-dependent diabetes mellitus.  5. CVA.  6. Depression.  7. Psychosis.  8. Anemia of chronic disease.  9. Coronary artery disease with history of myocardial infarction in      the past.  The patient had a 2-D echocardiogram completed July 2008      which revealed an EF of 20-25%.  The patient was evaluated for an      implantable cardioverter-defibrillator but was deemed not a      candidate.  10.Secondary hyperparathyroidism.  11.COPD.  12.Pulmonary hypertension.  13.Asthma.  14.GERD.  15.CVA in 1998.  16.History of diabetic gastroparesis.  17.Obstructive sleep apnea for which the patient  wears CPAP.   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 Waverly Ferrari.   ALLERGIES:  The patient is reported to be allergic to:  1. ASPIRIN.  2. CAPTOPRIL.  3. CODEINE.   CURRENT MEDICATIONS:  1. Zyprexa 5 mg 1 tablet p.o. daily.  2. Loratadine 10 mg 1 tablet p.o. daily.  3. Procrit injection 10,000 units IV with dialysis.  4. Zemplar 6 mcg IV with dialysis.  5. Nephro-Vite 1 tablet daily.  6. Wellbutrin XL 300 mg p.o. daily.  7. Lopressor 12.5 mg p.o. b.i.d.  8. Zocor 40 mg daily.  9. Neurontin 100 mg p.o. 3 times a day  10.Reglan 10 mg p.o. before meals and at bedtime.  11.Prilosec 20 mg p.o. nightly.  12.Colace 100 mg p.o. nightly.   SOCIAL HISTORY:  The patient has denied alcohol and cigarettes in the  past.   FAMILY HISTORY:  Currently is unknown.   REVIEW OF SYSTEMS:  As per HPI, otherwise all other systems are  negative.   PHYSICAL EXAMINATION:  VITAL SIGNS:  The patient's temperature is 98.2,  blood pressure 107/65, heart rate 81, respirations 18, O2 saturation  92%.  HEENT:  Normocephalic and atraumatic, anicteric.  Extraocular movements  are intact.  Oral mucosa is pink.  NECK:  Supple.  No lymphadenopathy, no thyromegaly.  CARDIAC:  S1, S2 present.  Regular rate and rhythm.  Heart sounds are  distant.  RESPIRATORY:  No crackles or wheezes.  ABDOMEN:  Flat, soft, nontender, nondistended.  Positive bowel sounds.  SKIN:  The patient has a large, difficult-to-stage ulcer on the lower  sacral area which is covered by a large purulent, tan, malodorous  discharge.  It measures approximately 4 x 4 cm in size.  The patient's  left stump also has a small area of wound dehiscence with a small  discharge.  Otherwise, there are multiple staples present within the  patient's left leg stump.  NEUROLOGIC:  The patient is alert and oriented to self only.  MUSCULOSKELETAL:  Not  assessed.   LABORATORY DATA:  No labs are currently available.   ASSESSMENT AND PLAN:  1. Infected large sacral decubitus ulcer.  Will culture the patient's      wound.  Will start the patient on empiric IV antibiotics.  Will      check blood cultures. Will consult the wound care nurse for further      recommendations and instructions.  2. Left stump wound.  Will send cultures from the patient's left wound      stump.  3. End-stage renal disease.  Will consult nephrology in order to      resume her previously scheduled dialysis.  4. Insulin-dependent diabetes mellitus.  Will start the patient on      Accu-Chek as well as sliding scale insulin for now.  5. Hypertension.  The patient's blood pressure currently is stable.      Therefore, will resume her prior antihypertensive medications.  6. History of coronary artery disease status post myocardial      infarction.  The patient currently does not complain      of any chest pain or shortness of breath.  Therefore, will monitor      this.  7. Gastrointestinal prophylaxis.  Will provide Protonix.  8. Deep vein thrombosis prophylaxis.  Will provide Lovenox.      Michaelyn Barter, M.D.  Electronically Signed     OR/MEDQ  D:  01/31/2007  T:  01/31/2007  Job:  [id number]   cc:   Maxwell Caul, M.D.

## 2010-07-08 NOTE — Consult Note (Signed)
[name], LIPS NO.:  [id number]   MEDICAL RECORD NO.:  [id number]          PATIENT TYPE:  INP   LOCATION:  6729                         FACILITY:  [facility]   PHYSICIAN:  Tia Alert, MD     DATE OF BIRTH:  1950-04-02   DATE OF CONSULTATION:  12/09/2007  DATE OF DISCHARGE:                                 CONSULTATION   CHIEF COMPLAINT:  Back pain.   HISTORY OF PRESENT ILLNESS:  Ms. [name] is a 60 year old female with a  history of end-stage renal disease on dialysis who was admitted on  December 08, 2007 to the hospitalist service with severe back pain.  She  has a history of diabetes and hypertension.  She is a very poor  historian, but stated that her back was in a muscle spasm.  She states  the pain is a 10/10 in intensity.  She denies any leg pain.  She is  status post left AKA, but denies any numbness and tingling in the legs  or pain in the legs.  She was mobile in a wheelchair prior to last  evening.  She was admitted and had plain films done which showed some  disk degeneration at L4-L5.  MRI showed some evidence of signal change  in the disk space, but no evidence of epidural phlegmon or swelling or  paraspinous process, but there was some question of possible diskitis.  She lies in bed and is unable to move without severe pain in her back.  Neurosurgical evaluation was requested.  Her sed rate was 10.  A CRP has  not been done at this point.   PAST MEDICAL HISTORY:  1. End-stage renal disease and receives hemodialysis on Tuesday,      Thursday, and Saturday.  2. Diabetes.  3. Hypertension.  4. Peripheral vascular disease.  5. COPD.  6. Coronary artery disease.  7. Obstructive sleep apnea.   MEDICATIONS:  Zyprexa, bupropion, metoprolol, Prilosec, Reglan,  simvastatin, Neurontin, Renagel, Nephro-Vite, and insulin.   SOCIAL HISTORY:  Denies use of tobacco or alcohol products.   FAMILY HISTORY:  Noncontributory.   REVIEW OF SYSTEMS:  Negative  other than those things listed above.   PHYSICAL EXAMINATION:  VITAL SIGNS:  She is afebrile.  Her vital signs  are stable with a blood pressure of 130/70 and heart rate in 90s.  GENERAL:  Alert patient who is in significant distress secondary to her  low back pain.  She has times where she looks fine and is talkative and  other times screams out in pain any time she tries to move.  HEENT:  Normocephalic and atraumatic.  Her pupils are equal and  reactive.  Oropharynx is benign.  NECK:  Supple.  EXTREMITIES:  She has a left above-the-knee amputation and a right toe  amputation.  No edema in the right lower extremity.  NEUROLOGIC:  She is awake and alert.  She is interactive.  She has no  aphasia.  She has fairly good attention span.  Her reflexes were  decreased in lower extremity but her upper extremity  strength is good  and her right lower extremity strength appears to be a good except for  every time she tries movement it causes severe pain and she screams out  in pain.  Therefore, we did not move her other than to try to get her  move her right lower extremity.   ASSESSMENT AND PLAN:  This is a very difficult situation in a 57-year-  old patient with significant comorbidities who has severe back pain.  She does have signal change in the disk space on the MRI, which is  somewhat worrisome for diskitis.  I do not see any paraspinous  involvement and the canal had some stenosis at L4-L5 and a small  leftward disk bulge at L5-S1.  Because we cannot give her contrast  because of her renal function, it is difficult to tell whether she has  an epidural process or not and it is difficult to note if this is  diskitis or not.  Certainly, her sed rate of 10 would suggest against  diskitis but clinically she appears to have enough pain to suggest  diskitis and her history of diabetes and end-stage renal disease and  dialysis certainly puts her at risk for bacteremia and therefore  diskitis.   Therefore, I have recommended repeat sed rate and a CRP.  I  have talked to the radiologist.  They are going to perform a needle  biopsy for US of the disk space to make sure that she does not have a  infection and that will be done tomorrow when she is off her Lovenox.  Her Lovenox is being held and has to be held for at least 12 hours  before we can do that.  I would hold on any antibiotic treatment  empirically until that can be done and then it may be useful to try  empiric antibiotics to cover gram-positives and gram-negatives as well  as anaerobes.  I would control her pain as you are doing for now and we  will await the needle biopsy and disk aspiration.      Tia Alert, MD  Electronically Signed     DSJ/MEDQ  D:  12/09/2007  T:  12/10/2007  Job:  [id number]

## 2010-07-08 NOTE — Assessment & Plan Note (Signed)
OFFICE VISIT   [name], [name]  DOB:  Jul 08, 1950                                       11/08/2007  WUJWJ#:[id number]   I saw the patient in the office today for continued followup of her  peripheral vascular disease.  She has had a previous left above-the-knee  amputation and she has been fitted for a prosthesis at [facility].  She has had a toe amputation of the second toe on the  right.  She has known severe tibial occlusive disease.  She comes in for  routine followup visit.  She is non-ambulatory currently.  She has had  no rest pain or new nonhealing ulcers on the right foot.  She has had no  stump pain on the left.   PHYSICAL EXAMINATION:  Blood pressure is 138/82.  Her left AKA has  healed nicely.  On the right side, her second toe amputation site is  healed.  She has no open ulcers.  The foot appears adequately perfused.   Overall, I am pleased with her progress and I will see her back in 6  months.  She knows to call sooner if she has problems.   [name]. [name], M.D.  Electronically Signed   CSD/MEDQ  D:  11/08/2007  T:  11/10/2007  Job:  [id number]

## 2010-07-08 NOTE — Procedures (Signed)
VASCULAR LAB EXAM   INDICATION:  Evaluation of right GSV for bypass graft.  History of ESRD.   HISTORY:  Diabetes:  Yes.  Cardiac:  CAD, CHF.  Hypertension:  Yes.   EXAM:  GSV mapping.   IMPRESSION:  1. Technically difficult study due to patient in wheelchair and      patient discomfort.  2. Right greater saphenous vein is not of suitable diameter for use      for bypass graft from level of the distal calf to the      saphenofemoral junction.  3. Please see attached work sheet for measurements.   ___________________________________________  [name]. [name], M.D.   PB/MEDQ  D:  06/15/2007  T:  06/15/2007  Job:  [id number]

## 2010-07-08 NOTE — Discharge Summary (Signed)
[name], [name]                 ACCOUNT NO.:  [id number]   MEDICAL RECORD NO.:  [id number]          PATIENT TYPE:  INP   LOCATION:  6740                         FACILITY:  [facility]   PHYSICIAN:  Terrial Rhodes, M.D.DATE OF BIRTH:  14-Oct-1950   DATE OF ADMISSION:  07/17/2008  DATE OF DISCHARGE:  07/30/2008                               DISCHARGE SUMMARY   DISCHARGE DIAGNOSES:  1. Weakness, resolved.  2. Citrobacter urinary tract infection, resolved.  3. 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  4. 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  5. End-stage renal disease.  6. Anemia.  7. Secondary hyperparathyroidism.  8. Hypothyroidism.  9. Diabetes mellitus.   PROCEDURES:  Jul 21, 2008; lumbar spine MRI without contrast.  Impression:  1. Improved changes at the diskitis and osteomyelitis at L4-L5.  2. Compared with CT and MRI studies from 2010 no progressive end plate      destruction or paraspinal fluid collection is identified.  3. No evidence of infection at the additional disk space levels.  4. Mild spondylosis at L3-L4 and L5-S1.   CONSULTATIONS:  [facility], Dr. Lalla Brothers.   HISTORY OF PRESENT ILLNESS:  This is a 60 year old African American  female with end-stage renal disease on hemodialysis Tuesday, Thursdays  and Saturdays at the [facility].  The patient  presented to the [facility] Emergency Room after missing her  hemodialysis treatment today because of weakness and generalized  malaise.  She denies any fever or chills.  She has presented to the  emergency department every month since being discharged home from the  nursing home in February. She has a poor home environment and many  comorbidities. She is also likely volume overloaded.  She is being  admitted for further evaluation.   ADMISSION LABORATORY DATA:  WBC 7.3, hemoglobin 13.3, hematocrit  42.0,  platelet 221.  Sodium 140, potassium 3.4, chloride 102, CO2 is 22,  glucose 123, BUN 57, creatinine 9.23, albumin 3.1 calcium 9.2,  phosphorus 9.4, magnesium 2.1.  CK-MB 1.0, troponin 0.05.   DIAGNOSTIC RADIOLOGICAL EXAMINATIONS:  Jul 17, 2008; two-view chest x-  ray.  Impression:  Cardiomegaly with pulmonary vascular congestion.   HOSPITAL COURSE:  1. Weakness, resolved as described above.  The patient was admitted      with the exact etiology of weakness likely multifactorial. The      patient was pancultured and found to have an urinary tract      infection as well as 1 out of 2 blood cultures positive.  Please      refer to #2 and #4 for further information.  The patient was also      noted to have tachyarrhythmias during her stay, please refer to #3      for further information.  Physical therapy and occupational therapy      were consulted for strengthening which the patient often refused to      participate. She did agree, however, to  enter a skilled nursing      facility at the time of discharge for 24-hours assisted care.  2. Citrobacter urinary tract infection, resolved as described above.      The patient was pancultured at the time of admission she was found      to have a Citrobacter urinary tract infection.  She was placed on      Cipro therapy during her stay and completed her course of the      antibiotic.  3. History of tachyarrhythmias, myocardial infarction, ischemic      cardiomyopathy in V-fib arrest as described above.  At the time of      admission the patient was noted to have episodes of tachy      arrhythmia. A cardiology consult was obtained as the patient's      magnesium level was normal.  However, she was noted to have some      abnormal thyroid studies. As a result, she was started on low-dose      oral replacement.  Initially the patient's beta blocker therapy was      increased and she was initiated on digoxin therapy as well.       Unfortunately, the patient's blood pressure dropped significantly      during dialysis preventing adequate ultrafiltration. Her beta      blocker therapy was changed to non-hemodialysis days which the      patient seemed to tolerate better without further episodes of      severe intradialytic hypotension during the remainder of her stay.      The beta blocker and digoxin therapy also seemed to adequately      control her tachyarrhythmias as well with heart rate at the time of      discharge ranging from the 60s to 70s.  4. History of diskitis with a 1 out of 2 blood cultures positive for      coag-negative staph. The patient completed a 6-week course of      vancomycin therapy towards the end of March, however, the coag-      negative staph was concerning. Although the patient's lumbar spine      was re-imaged and improvement was noted on the film it was felt by      her primary nephrologist that the patient should be on a short      course of vancomycin therapy as she a catheter-dependent      hemodialysis patient.  The patient continue vancomycin throughout      her stay and her last dose of vancomycin will be on August 09, 2008      at the outpatient hemodialysis center.  5. End-stage renal disease.  The patient continued hemodialysis during      her hospitalization via a PermCath.  Average ultrafiltration was 2      liters.  Average blood flow rate was 350, heart rate towards the      end of her hospital course ranged from the 60s to 70s systolic and      it ranged from the 90s to 120s.  6. Anemia.  The patient's hemoglobin remained stable during her      hospitalization without overt signs of bleeding.  She was continued      on low-dose Aranesp therapy during her stay which she tolerated      well.  Hemoglobin at the time of discharge was 11.3.  7. Secondary hyperparathyroidism.  The patient was initially continued  on her vitamin D and phosphate binder therapy during her stay.       However, her calcium level became elevated and as a result her      vitamin D therapy was held which at the time of discharge her      vitamin D therapy will continue to be on hold and we will continue      to monitor her calcium and phosphorus levels at the outpatient      hemodialysis center.  8. Hypothyroidism, as described above.  The patient did have some      abnormal thyroid studies, as a result she was started on low-dose      Synthroid therapy during her stay which she seemed to tolerate well      without difficulty.  9. Diabetes mellitus.  The patient's blood glucose levels remained      fairly well-controlled during her hospitalization. She was placed      on a carbohydrate modified diet as well as her outpatient regimen      of Lantus therapy.  Accu-Cheks were obtained before meals and at      bedtime the patient received sliding-scale insulin. On the day of      discharge her blood sugars are ranging from 111 to 140.   DISCHARGE MEDICATIONS:  1. Gabapentin 100 mg p.o. t.i.d.  2. Zocor 40 mg p.o. q.h.s.  3. Wellbutrin XL 150 mg daily.  4. Zyprexa 2.5 mg p.o. q.h.s.  5. Nephro-Vite 1 tablet p.o. daily.  6. Renagel 3 tablets p.o. t.i.d. with meals.  7. Lantus insulin 20 units subcu q.h.s.  8. Reglan 10 mg t.i.d. with meals.  9. Colace 100 mg p.o. b.i.d.  10.Plavix 75 mg daily.  11.Omeprazole 20 mg p.o. q.h.s.  12.Digoxin 0.125 mg p.o. after dialysis on Tuesdays, Thursdays and      Saturdays.  13.Levothyroxine 50 mcg p.o. daily.  14.Toprol XL 12.5 mg p.o. q.h.s. on non-dialysis days which are      Monday, Wednesday, Friday and Sunday.  15.Nepro 237 mL p.o. b.i.d.  16.Combivent inhaler 1-2 puffs q.i.d. p.r.n. wheezing.  17.MiraLax 17 grams p.o. daily p.r.n. constipation.  18.Albuterol 90 mcg 2 puffs q.4 hours p.r.n. shortness of breath or      wheezing.  19.Tylenol 650 mg p.o. q.4 hours p.r.n. pain.  20.Sorbitol 30-60 mL p.o. daily p.r.n. constipation.  21.Sarna  anti-itch lotion apply topically as needed for pruritus.  22.Ambien 5 mg p.o. q.h.s. p.r.n. insomnia.  23.Phenergan 25 mg per rectum q.i.d. p.r.n. nausea or vomiting.  24.Atarax 25 mg 1 tablet p.o. q.8 hours p.r.n. pruritus.  25.Accu-Cheks before meals and at bedtime following sliding scale      insulin for insulin Aspart; blood sugars 151-200 two units subcu,      201-250 four units subcu, 251-300 six units subcu, 301-350 eight      units subcu, 351-400 ten units subcu, 401-450 twelve units subcu,      greater than or equal to 451 fourteen units subcu and recheck in 1      hour.  26.Hemodialysis medications; Epogen 5000 units IV q. hemodialysis      treatment.  27.Vancomycin 1 gram IV q. hemodialysis treatment through June 17,      20 10.   DISCHARGE INSTRUCTIONS:  The nursing home is responsible for  transporting the patient to and from her outpatient hemodialysis  treatments every Tuesday, Thursday and Saturday at the [facility].  The patient is to be  placed on diabetic renal diet with  a 1200 mL fluid restriction daily.  Sponge bathe only.  The patient's  catheter site must be kept dry.  Physical therapy and occupational  therapy for strengthening.   HEMODIALYSIS INSTRUCTIONS:  Please obtain a vancomycin level  pretreatment next treatment on stat.  The patient is to continue 2  potassium 2.25 calcium bath, continue to hold the patient's vitamin D  therapy.  Please obtain a calcium and phosphorus level pretreatment non-  stat at her next outpatient hemodialysis treatment.  No change in the  patient's estimated dry weight at this time.  Please do noted, however,  that during the patient's hospitalization her estimated dry weight prior  to discharge was 96.0 kg. This will need to be reevaluated at the  outpatient hemodialysis center at the patient's next treatment with the  rounding Issa Luster. No other hemodialysis changes at this time.   Please note it took  approximately 45 minutes to complete this discharge.      Tracey P. Sherrod, NP    ______________________________  Terrial Rhodes, M.D.    TPS/MEDQ  D:  07/30/2008  T:  07/30/2008  Job:  [id number]   cc:   [facility]

## 2010-07-08 NOTE — Procedures (Signed)
BYPASS GRAFT EVALUATION   INDICATION:  Followup of right bypass graft.  The patient complains of  severe pain behind her right knee for approximately 3 weeks.   HISTORY:  Diabetes:  Yes.  Cardiac:  CAD, CHF.  Hypertension:  Yes.  Smoking:  No.  Previous Surgery:  Right fem-pop BPG with nonreversed vein on 03/27/2003  by Dr. [name].  Right first toe amputation.  Left below knee amputation  approximately 3 months ago.   SINGLE LEVEL ARTERIAL EXAM                               RIGHT              LEFT  Brachial:  Anterior tibial:  Posterior tibial:  Peroneal:  Ankle/brachial index:   PREVIOUS ABI:  Date:  RIGHT:  LEFT:   LOWER EXTREMITY BYPASS GRAFT DUPLEX EXAM:   DUPLEX:     1. Difficult exam secondary to scan performed with the patient in        wheelchair and in pain.     2. There is a vessel (PFA) versus tunneled graft seen in thigh with        biphasic low flow with velocities ranging from 19 cm/s to 50 cm/s        followed from the groin to the mid distal thigh where it is then        not visualized.     3. Questionable graft occlusion versus PFA.     4. Native popliteal artery appears occluded distally via color and        Doppler interrogation.   IMPRESSION:  1. Monophasic flow noted in right PT.  2. Difficult exam secondary to scan performed with the patient in      wheelchair and in pain.  3. There is a vessel (PFA) versus tunneled graft seen in thigh with      biphasic low flow followed from the groin to the mid distal thigh      where it is then not visualized.  4. Questionable graft occlusion versus PFA.  5. Native popliteal artery appears occluded distally via color and      Doppler interrogation.   ___________________________________________  Larina Earthly, M.D.   PB/MEDQ  D:  04/15/2007  T:  04/17/2007  Job:  [id number]

## 2010-07-08 NOTE — Consult Note (Signed)
[name], [name]                 ACCOUNT NO.:  [id number]   MEDICAL RECORD NO.:  [id number]          PATIENT TYPE:  INP   LOCATION:  5508                         FACILITY:  [facility]   PHYSICIAN:  Quita Skye. Hart Rochester, M.D.  DATE OF BIRTH:  1950/12/05   DATE OF CONSULTATION:  04/08/2007  DATE OF DISCHARGE:                                 CONSULTATION   REFERRED BY:  [facility].   CHIEF COMPLAINT:  Abdominal pain and vomiting with possible ischemic  ulcer, right foot.   HISTORY OF PRESENT ILLNESS:  This 60 year old female patient known to  Dr. Edilia [name] from previous right femoral popliteal bypass grafting and a  right first toe amputation 1 year ago approximately.  She has also had a  left above-knee amputation by Dr. Edilia [name] about 3 months ago.  She is  admitted now for abdominal pain, nausea, and vomiting, and consultation  was requested regarding her right foot.  She is not complaining of her  right foot at this point.   PAST MEDICAL HISTORY:  1. End-stage renal disease with diabetes mellitus.  2. Possible pancreatitis.  3. Coronary artery disease, previous myocardial infarction.  4. Pulmonary hypertension.  5. COPD.  6. Bronchial asthma.  7. Dyslipidemia.   PHYSICAL EXAMINATION:  EXTREMITIES:  Examination reveals the left leg to  have a 3+ femoral pulse with a well-healed above-knee amputation.  The  right leg has 3+ femoral and 2+ popliteal graft pulse with absent distal  pulses.  The right first toe has been amputated and is well-healed.  The  second toe has a dry eschar at the tip with no evidence of infection,  and the other toes have chronic ischemic changes.  There is no evidence  of any active infection or ischemia.   IMPRESSION:  Stable post right femoral-popliteal bypass graft with  chronic ischemic changes, right foot.  No need for an amputation at the  current time.  Will follow the patient as we have been doing in the  office on the protocol for her lower  extremity bypass graft.  Will  notify Dr. Edilia [name] that she had been admitted.      Quita Skye Hart Rochester, M.D.  Electronically Signed     JDL/MEDQ  D:  04/08/2007  T:  04/10/2007  Job:  [id number]

## 2010-07-08 NOTE — H&P (Signed)
NAMEJAILEE, [name] NO.:  [id number]   MEDICAL RECORD NO.:  [id number]          PATIENT TYPE:  INP   LOCATION:  5503                         FACILITY:  [facility]   PHYSICIAN:  Terrial Rhodes, M.D.DATE OF BIRTH:  1950/06/23   DATE OF ADMISSION:  10/12/2006  DATE OF DISCHARGE:                              HISTORY & PHYSICAL   [facility].   CHIEF COMPLAINT:  Shortness of breath and diffuse itching.   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  The patient has came to the ED twice on August 13 with  complaints of itching.  She has since then developed increasing  shortness of breath.  She denies chest pain, diaphoresis, fever or  chills.  Of note, the patient ran out of her Lorazepam 1-2 weeks ago.   PAST MEDICAL HISTORY:  1. End-stage renal disease on hemodialysis secondary to diabetes since      November 2002.  2. 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  3. Coronary artery disease with history of MI x3 followed by Dr.      Algie Coffer.  4. 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  5. Hypertension.  6. Iron deficiency anemia.  7. Secondary hyperparathyroidism.  8. COP and asthma.  9. Pulmonary hypertension.  10.Depression anxiety.  11.History of CVA in 1998 with no residual deficits.  12.Status post left ankle fracture in 2003, repaired by Dr. Thomasena Edis.  13.Status post rotator cuff tear in 2003 with left shoulder bursitis      followed by Dr.  Thomasena Edis.  14.Severe peripheral vascular disease status post left tibial bypass.  15.Severe bilateral infrainguinal arterial occlusive disease requiring      hospitalization in February 2005 status post right femoral      posterior tibial bypass.  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      Darrick Penna.  17.Admitted in February 2008 for pulmonary edema at which point she      was discharged home on O2 as needed.   ALLERGIES:  CODEINE, CAPTOPRIL AND ASPIRIN.   HOME MEDICATIONS:  1. Carvedilol 10 mg p.o. q.h.s., currently on hold for itching.  2. Tramadol 50 mg p.o. every 6 hours as needed for pain, currently on      hold for itching.  3. Lorazepam 1 mg p.o. q.h.s. as needed.  4. Reglan 10 mg p.o. q.a.c. h.s.  5. Renavite daily.  6. Insulin 70/30 40 units q.a.m. and 28 units q.p.m.  7. Zocor 40 mg p.o. daily.  8. Omeprazole 12 mg p.o. daily.  9. PhosLo 667 mg 3 tablets p.o. t.i.d. with meals.  10.Albuterol inhaler  as needed.   SOCIAL HISTORY:  The patient lives with her mom.  Denies tobacco,  alcohol or drug use.  She is currently on disability.   FAMILY HISTORY:  The patient's father died with diabetes.  The patient's  mother and brother and sister all have hypertension.   REVIEW OF SYSTEMS:  The patient denies fever, chills or sweats.  Does  report fatigue.  Denies headache, nasal discharge, rash, lesions, but  does report generalized __________ .  Denies chest pain.  Does report  shortness of breath, dyspnea on exertion and orthopnea.  Denies cough,  wheezing or edema.  Denies urinary frequency, urgency, dysuria or  hematuria.  Denies numbness.  Does report left great toe pain secondary  to gangrene.  Also reports nausea and vomiting.  Denies diarrhea, bloody  or melanotic stools.  Denies abdominal pain or bowel habit changes.   PHYSICAL EXAMINATION:  VITAL SIGNS:  Temperature is 97.7, heart rate  80s, respiratory rate 28, blood pressure 146/75, oxygen  saturation 98%  on 3 liters O2.  GENERAL:  The patient is awake, alert and visibly anxious and short of  breath.  HEENT: Head is normocephalic and atraumatic.  Extraocular movements  intact.  Sclerae are clear.  Pupils are equal, round, reactive to light.  Nares without discharge and moist mucous membranes.  NECK:  Thick.  CARDIOVASCULAR:  Heart is regular rate and rhythm with normal S1-S2 and  distant heart sounds.  Pulses are __________ equal bilaterally in  dorsalis pedis region.  LUNGS:  Minimal decreased breath sounds in bases bilaterally and are  otherwise clear to auscultation without wheezes, rales or rhonchi.  SKIN:  Has no rash or lesions.  ABDOMEN:  Soft and nontender with normoactive bowel sounds and no  rebound or guarding.  EXTREMITIES:  No clubbing, cyanosis or edema.  Left great toe is  gangrenous.  ACCESS:  The patient has left forearm AV graft with palpable thrill.  MUSCULOSKELETAL:  No joint deformity.  NEUROLOGICAL:  Alert and oriented x3.  Cranial II-XII grossly intact.  Strength 5/5 bilaterally with normal sensation throughout.   STUDIES:  Chest x-ray:  She has moderate to marked cardiac enlargement  which is stable with increased interstitial markings consistent with  edema and left upper lobe scar versus atelectasis.   LABORATORY DATA:  White blood cell count of 10.8, hemoglobin 11.6,  platelets 280.  Sodium 133, potassium 4.3, bicarb 24.5, BUN 58, glucose  253.  ABG showed pH 7.501, pCO2 of 29.2, pO2 of 77, bicarb of 22.8  oxygen saturation 97.  D-dimer is mildly elevated at 0.93.  LFTs are  within normal limits, albumin 3.1.   ASSESSMENT/PLAN:  This is a 60 year old African American female with end-  stage renal disease on hemodialysis secondary to diabetes as well as  ischemic dilated cardiomyopathy with ejection fraction 20-25%, pulmonary  hypertension and COPD and asthma who is admitted with 3-day history of  dyspnea, nausea, vomiting and generalized  pruritus.   PROBLEMS:  1. Dyspnea.  Likely secondary to pulmonary edema based on chest x-ray.      In addition to underlying COPD and asthma, obstructive sleep apnea      and pulmonary hypertension.  Will continue oxygen as needed.  Will      give albuterol nebs as needed for symptomatic relief.  The patient      is currently receiving dialysis now, on admission will try to pull      4 liters.  Will place on  tele and rule out cardiac enzymes x3.      Will repeat ABG after dialysis and the patient remains mildly      hypoxic.  Will get CT angio.  2. Pruritus.  The patient is without rash or lesions.  Likely      secondary to Lorazepam withdrawal.  Already holding the patient's      carvedilol and tramadol in outpatient setting since August 12      without relief.  Will restart lorazepam 0.5 mg p.o. b.i.d.  3. Nausea and vomiting.  Likely secondary to Lorazepam withdrawal      versus gastroparesis.  We will give Phenergan as needed for      symptomatic relief and continue Reglan.  4. End stage renal disease on hemodialysis on Tuesday, Thursday,      Saturday.  The patient is currently getting hemodialysis now.  Will      continue Tuesday, Thursday, Saturday schedule.  5. Diabetes.  Will place the patient on half-dose home dose of insulin      as well as a sliding scale coverage for now until p.o. intake      improves.  The patient shows no evidence of DKA despite being      without insulin for 3 days.  Will continue Reglan for      gastroparesis.  6. Secondary hyperparathyroidism.  Will continue PhosLo and Zemplar.  7. Anemia:  The patient is not currently Epogen or iron.  Last      hemoglobin was 12.5 on September 17, 2006.  8. Gastroesophageal reflux disease:  Will continue Omeprazole.  9. Hyperlipidemia.  Will continue Zocor.  10.Prophylaxis.  Will continue Omeprazole and place STDs to lower      extremities bilaterally.   DISPOSITION:  Pending improvement in #1 and #2.      Drue Dun, M.D.  Electronically Signed     ______________________________  Terrial Rhodes, M.D.    EE/MEDQ  D:  10/13/2006  T:  10/13/2006  Job:  [id number]

## 2010-07-08 NOTE — H&P (Signed)
NAMELARAY, RIVKIN NO.:  [id number]   MEDICAL RECORD NO.:  [id number]          PATIENT TYPE:  INP   LOCATION:  1826                         FACILITY:  [facility]   PHYSICIAN:  Lucita Ferrara, MD         DATE OF BIRTH:  [birth year]   DATE OF ADMISSION:  04/09/2008  DATE OF DISCHARGE:                              HISTORY & PHYSICAL   The patient is a 60 year old female with end-stage renal disease and  multiple other medical problems, including prior history of  cardiopulmonary arrest and ventricular fibrillation; with a history of  prior resuscitation.  From a skilled nursing facility with a chief  complaint of intractable nausea and vomiting.  The patient has known  history of similar episodes of similar admissions in the past, and has a  known history of diabetic gastroparesis.  Vomitus is non biliary and not  bloody.  She denies coffee ground emesis.  She denies chest pain.  During the last admission she was found to have diskitis and diabetic  gastroparesis.   The vomiting is intractable.  The patient has generalized abdominal  pain.  The abdomen is not distended, however.  She denies any fevers or  chills or cough.   PAST MEDICAL HISTORY:  Significant for:  1. Prior history of cardiopulmonary arrest with ventricular      tachycardia and ventricular fibrillation.  2. Ischemic cardiomyopathy with ejection fraction of 20%.  3. Coronary artery disease with complete occlusion of the right      coronary artery and left circumflex collaterals.  4. Peripheral vascular disease.  5. End-stage renal disease on hemodialysis Tuesday, Thursday and      Saturday.  6. Diabetes type 2.  7. Peripheral vascular disease status post left below-the-knee      amputation.  8. COPD.  9. Obstructive sleep apnea and coupled with obesity hypoventilation      syndrome.  10.Recent admission for diskitis of L4-L5.  11.History of coagulase-negative staph infection.  12.Chronic hypoxemia  with O2 dependent.  13.Secondary hypoparathyroidism  14.History of pancreatitis.  15.Prior history of sacral decubitus ulcerations.   CURRENT MEDICATIONS:  1. Lantus 60 units subcutaneous nightly.  2. Nephro-Vite one tablet daily.  3. Prilosec 20 mg daily.  4. Renagel 2400 mg p.o. t.i.d. with meals.  5. Colace 100 mg p.o. b.i.d.  6. Wellbutrin XL 150 mg daily.  7. Lopressor 12.5 mg p.o. daily.  8. Zyprexa 2.5 mg daily.  9. Percocet.  10.Vancomycin with hemodialysis for 6 weeks, which started April 08, 2008.  11.The patient is due to start doxycycline 50 mg daily on May 17, 2008 per Dr. Sampson Goon.  12.Reglan 10 mg t.i.d. p.r.n. for nausea.   ALLERGIES:  ASPIRIN, CODEINE, CAPEPOTEN, IVP DYE, IODINE-CONTAINING  SUBSTANCES,   MEDICATIONS:  Include the above.   FAMILY HISTORY:  Positive family history of diabetes and hypertension on  both sides of family.   SOCIAL HISTORY:  Denies any drugs, alcohol or tobacco.  Lives in a  skilled nursing facility.  She  is not married.   REVIEW OF SYSTEMS:  As per HPI, otherwise negative.   PHYSICAL EXAMINATION:  Blood pressure 155/89, pulse 123, respirations  20, temperature 97.1, pulse oximetry 98% on 2 liters nasal cannula.  GENERAL:  Generally speaking, the patient is a morbidly obese female.  She is in obvious discomfort from generalized abdominal pain, nausea and  vomiting.  HEENT:  Normocephalic, atraumatic.  Sclerae is anicteric.  Mucous  membranes are moist.  NECK:  Supple.  No JVD, no carotid bruits.  CARDIOVASCULAR:  S1 and S2; regular rate and rhythm.  No murmurs, rubs,  or clicks.  Chest wall the patient has TSIO catheter placed bilaterally.  ABDOMEN:  Obese, soft, nontender, nondistended.  Positive bowel sounds.  EXTREMITIES:  There is a left-sided BKA.  There is lower extremity edema  on the right side of 2+.  MUSCULOSKELETAL: There is slight tenderness of her L4-L5 vertebrae.   LABORATORY DATA:  CBC:  White  blood cell count is missing.  Otherwise  negative.  No other labs performed here in the emergency room.  There is  no radiological data.   ASSESSMENT AND PLAN:  The patient is a 60 year old with:  1. Intractable nausea and vomiting.  2. Known history of acute gastroparesis.  3. Diabetes type 2.  4. Chronic back pain with known history of diskitis; on vancomycin and      followed by Infectious Disease.  5. End-stage renal disease on hemodialysis Tuesday, Thursday,      Saturday.  6. Hypertension.  7. Depression.   DISCUSSION AND PLAN:  1. The patient will be admitted to the medical telemetry floor.      Cardiac enzymes will be cycled x3 every 8 hours.  Will initiate IV      Reglan 10 mg q.8 h.  Will continue Zofran p.r.n.  Will get a lipase      level.  Will initiate IV Protonix 40 mg IV b.i.d.  2. For her back pain and diskitis, obviously the patient needs to have      a CBC including a white blood cell count.  We will get  an ESR and      will continue vancomycin per previous plans, and if needed will      reconsult Infectious Disease.  Neurosurgery will be consulted as      needed.  3. For her diabetes type 2 we will initiate her home regimen, although      if she is not eating will cut it in half.  4. Hypertension.  Continue intravenous p.r.n. medications for better      control.  5. Given her obstructive sleep apnea and chronic hypoxemia-      hypoventilation syndrome.  Continue nasal CPAP.  6. DVT prophylaxis with heparin 5000 units every 8 hours.   The rest of the plans are dependent on her progress and consult  recommendations.      Lucita Ferrara, MD  Electronically Signed     RR/MEDQ  D:  04/09/2008  T:  04/09/2008  Job:  [id number]

## 2010-07-08 NOTE — Op Note (Signed)
[name], [name]                 ACCOUNT NO.:  [id number]   MEDICAL RECORD NO.:  [id number]          PATIENT TYPE:  AMB   LOCATION:  SDS                          FACILITY:  [facility]   PHYSICIAN:  [name]. [name], M.D.DATE OF BIRTH:  03/23/1950   DATE OF PROCEDURE:  05/16/2007  DATE OF DISCHARGE:                               OPERATIVE REPORT   PREOPERATIVE DIAGNOSIS:  Dry gangrene of the right second toe with  infrainguinal arterial occlusive disease.   POSTOPERATIVE DIAGNOSIS:  Dry gangrene of the right second toe with  infrainguinal arterial occlusive disease.   PROCEDURE:  1. Ultrasound guided access of the left common femoral artery.  2. Selective right external iliac arteriogram with single right lower      extremity runoff.   SURGEON:  [name]. [name], M.D.   ANESTHESIA:  Local.   TECHNIQUE:  The patient was taken to the PV lab at [facility].  The groins  were prepped and draped in the usual sterile fashion.  The skin was  anesthetized with 1% lidocaine on the left and under ultrasound guidance  the left common femoral artery was cannulated and a guidewire introduced  into the infrarenal aorta under fluoroscopic control.  The IMA catheter  was advanced over the wire and positioned into the right common iliac  artery.  The wire was then advanced into the external iliac artery on  the right and then the IMA catheter was exchanged for an end-hole  catheter.  Selective right external iliac arteriogram was obtained.  Additional spot films were obtained in the right leg.   FINDINGS:  The external iliac and common femoral artery on the right are  widely patent.  The deep femoral artery is patent.  The superficial  femoral artery has moderate diffuse disease throughout its proximal  segments. The SFA is then occluded distally with reconstitution of a  diseased popliteal artery.  The femoral to below-knee popliteal artery  bypass graft is patent with no areas of stenosis  identified within the  graft.  Just beneath the distal anastomosis there is significant  atherosclerotic disease and narrowing involving the distal popliteal and  tibial peroneal trunk.  The anterior tibial artery is occluded  proximally.  The posterior tibial artery is occluded but then  reconstitutes shortly thereafter.  The artery is small and calcified but  patent.  The peroneal artery is occluded.   CONCLUSIONS:  Patent right fem-pop bypass graft with progression of her  tibial occlusive disease on the right.  Single-vessel runoff via a small  calcified posterior tibial artery.      [name]. [name], M.D.  Electronically Signed     CSD/MEDQ  D:  05/16/2007  T:  05/16/2007  Job:  [id number]   cc:   [facility]

## 2010-07-08 NOTE — H&P (Signed)
NAMEBLONDINE, HOTTEL NO.:  [id number]   MEDICAL RECORD NO.:  [id number]          PATIENT TYPE:  INP   LOCATION:  5523                         FACILITY:  [facility]   PHYSICIAN:  Garnetta Buddy, M.D.   DATE OF BIRTH:  September 20, 1950   DATE OF ADMISSION:  08/24/2006  DATE OF DISCHARGE:  08/31/2006                              HISTORY & PHYSICAL   REASON FOR ADMISSION:  Cough and shortness of breath.   HISTORY OF PRESENT ILLNESS:  Mrs. [name] is a 60 year old African-American  female who presented to the emergency room complaining of productive  cough x4 days with clear sputum.  She has not used anything over the  counter to relieve the call cough and nothing makes it worse.  Two days  prior, she noticed the cough while at dialysis and reported a fever of  greater than 100.  The patient states she was given IV medications at  dialysis of unknown kind, but the shortness of breath and weakness  progressed.  The patient states she has had insomnia for the last three  nights secondary to the cough.  The patient does admit to use of 2  liters of oxygen nightly and sometimes during the day.  The patient has  unrelated chest pain, nausea and vomiting, chills or night sweats.  She  currently has no upper respiratory symptoms.   PAST MEDICAL HISTORY:  1. 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  2. Insulin-dependent diabetes mellitus.  3. Coronary artery disease.  4. Ischemic dilated cardiomyopathy.  5. Hypertension.  6. Secondary hyperparathyroidism.  7. COPD.  8. Pulmonary hypertension.  9. Asthma.  10.Depression.  11.Anxiety.  12.GERD.  13.History of CVA in 1998.  14.Peripheral vascular disease.   ALLERGIES:  1. CODEINE.  2. CAPTOPRIL.   CURRENT MEDICATIONS:  1. Simvastatin 40 mg daily.  2. Lorazepam 1 mg b.i.d. p.r.n.  3. Omeprazole 20 mg daily.  4. Insulin 70/30, unknown units.  5. Albuterol metered-dose inhaler 1-2 puffs q.i.d.  6. Norvasc 10  mg daily.  7. Hydroxyzine 25 mg daily.  8. Protonix 40 mg daily.  9. Reglan 5 mg t.i.d. a.c.  10.PhosLo 667 mg two p.o. t.i.d. a.c.   SOCIAL HISTORY:  The patient currently lives in [place] with her mom,  sister and brother.  She is currently disabled.  She is not married.  She has no children.  She is a nonsmoker, alcohol or drug user.   FAMILY HISTORY:  Positive for hypertension in siblings.   REVIEW OF SYSTEMS:  Please see HPI.  GENERAL:  Positive fever, positive  fatigue.  No chills or sweats.  HEENT:  No headache, nasal discharge.  SKIN:  No rash or lesions.  CARDIOPULMONARY:  Positive for shortness of  breath and dyspnea on exertion.  No edema or claudication.  GU:  No  frequency or urgency.  NEUROPSYCHIATRIC:  No weakness, numbness, history  of mood, depression and anxiety.  GI: No nausea or vomiting or diarrhea.   PHYSICAL EXAMINATION:  VITAL SIGNS:  Temperature 100, pulse 90,  respirations 26, blood pressure 139/60, O2  saturations 93% on 5 liters.  GENERAL:  Mrs. Blalock is an obese African-American female in mild acute  distress.  HEENT:  Head is atraumatic, normocephalic.  Extraocular muscles are  intact.  Sclerae with slight icterus bilaterally.  TMs are clear.  Nares  without discharge.  Oropharynx without erythema or exudate.  NECK:  Supple.  No lymphadenopathy.  No JVD.  CARDIOVASCULAR:  Regular rate and rhythm.  S1 and S2.  LUNGS:  Poor effort, decreased breath sounds.  Mild expiratory wheeze  bilaterally with a few right lower base rales.  GI:  Abdomen is obese, soft, nontender.  Bowel sounds are present in all  quadrants.  No organomegaly is noted.  EXTREMITIES:  No lower extremity edema.  Patient with femoral AV graft  with positive thrill and bruit.  NEUROLOGIC:  Alert and oriented x3.  Cranial nerves II-XII are grossly  intact.   ADMITTING CHEST X-RAY:  Moderate cardiomegaly, alveolar edema versus  infection, left greater than right, probable layering left  pleural  effusion.   ADMISSION LABORATORIES:  White blood cell count 11.7, hemoglobin 12.9,  platelets 303, potassium 4.2, glucose 228, calcium 9.9, albumin 3.1, AST  15, ALT 13.   ASSESSMENT/PLAN:  As discussed with Dr. Elvis Coil who was in to see  and evaluate this patient.  1. Dyspnea in the setting of pulmonary edema versus infiltrate per      chest x-ray.  The patient will be admitted with supportive care of      O2 and metered dose inhalers.  Empiric coverage with Avelox.      Repeat chest x-ray following hemodialysis.  2. End-stage renal disease.  Continue hemodialysis Tuesday, Thursday,      Saturday schedule.  Continued to increase ultrafiltration.  3. Insulin-dependent diabetes mellitus.  Accu-Cheks routine.  Cover      with sliding scale insulin.  4. Hypertension.  Continue home medications of Norvasc at night time.  5. Anemia of chronic disease.  Hemoglobin not an issue at this point.      No IV medications.  6. Secondary hyperparathyroidism.  Continue IV vitamin D and phosphate      binders.  7. Chronic obstructive pulmonary disease; see #1.  8. Hypercholesteremia.  Continue Zocor.  9. Gastroesophageal reflux disease.  Continue Protonix.  10.End-of-life issues.  The patient is currently a full code.      Azucena Fallen, [place]      Garnetta Buddy, M.D.  Electronically Signed    MY/MEDQ  D:  09/30/2006  T:  09/30/2006  Job:  [id number]

## 2010-07-08 NOTE — H&P (Signed)
[name], Julia                 ACCOUNT NO.:  [id number]   MEDICAL RECORD NO.:  [id number]           PATIENT TYPE:   LOCATION:                                 FACILITY:   PHYSICIAN:  [name]. [name], M.D.DATE OF BIRTH:  13-Apr-1950   DATE OF ADMISSION:  DATE OF DISCHARGE:                              HISTORY & PHYSICAL   ADDENDUM   PHYSICAL EXAMINATION:  NECK:  Supple.  There is no cervical lymphadenopathy.  LUNGS:  Clear bilaterally to auscultation.  CARDIAC:  She has a regular rate and rhythm.  ABDOMEN:  Obese.  Her abdomen is soft and nontender.  She has normal  pitched bowel sounds.  EXTREMITIES:  She has palpable femoral pulses.  I cannot palpate pedal  pulses.  The right foot appears adequately perfused.  SKIN:  She has dry gangrene of the right second toe.  There are no open  ulcers on the left foot.   IMPRESSION:  This patient presents with dry gangrene of the right second  toe.  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  She would like to proceed with toe  amputation because of the pain associated with this, and we have  discussed the indications for surgery and the potential complications  including the risk of nonhealing, which could potentially necessitate  more proximal amputation.  She is agreeable to proceed.  She is brought  in electively for right second toe amputation.      [name]. [name], M.D.  Electronically Signed     CSD/MEDQ  D:  07/27/2007  T:  07/28/2007  Job:  [id number]

## 2010-07-08 NOTE — H&P (Signed)
[name], SKOP NO.:  [id number]   MEDICAL RECORD NO.:  [id number]          PATIENT TYPE:  EMS   LOCATION:  [facility]                         FACILITY:  [facility]   PHYSICIAN:  Altha Harm, MDDATE OF BIRTH:  10-09-1950   DATE OF ADMISSION:  10/24/2008  DATE OF DISCHARGE:                              HISTORY & PHYSICAL   CHIEF COMPLAINT:  Nausea and vomiting x2 days.   HISTORY OF PRESENT ILLNESS:  Ms. [name] is a 60 year old lady with a  history of diabetes with diabetic gastroparesis, end-stage renal disease  hemodialysis dependent, who presents to the emergency room with a  complaint of 2 days of intractable nausea and vomiting.  The patient  states that she started having nausea and vomiting yesterday morning and  went to her dialysis session, and received her full run of dialysis.  She states that the nausea and vomiting persisted throughout the day and  has continued on in today.  Of note, the patient is supposed to be on  Reglan, however, has not been taking that for reasons that are unclear.  The patient is not sure of her medications and I am currently attempting  to obtain her medication list from the dialysis center.  The patient  denies any abdominal pain or any chest pains.  She denies any diarrhea.  She denies any fever, chills or any change in activity.  Her only  complaint is of nausea and vomiting preventing her from having any oral  intake.  Presently, the nausea and vomiting still is persisting and has  vomited within the last half an hour here in the emergency room.   PAST MEDICAL HISTORY:  1. End-stage renal disease - hemodialysis dependent on Tuesday,      Thursday and Saturdays.  2. Diabetes type 2.  3. Diabetic gastroparesis.  4. Coronary artery disease.  5. Cardiomyopathy.  6. COPD O2 dependent of 3 liters (The patient has been out of her      oxygen for approximately 1 month.)  7. Hypertension.  8. Secondary hypoparathyroidism.  9. Depression.  10.Peripheral vascular disease status post left above-knee amputation.  11.Obstructive sleep apnea.  12.Diskitis  13.History of VTach and Vfib with cardiac arrest in the past.   FAMILY HISTORY:  1. Significant for diabetes and congestive heart failure in her      mother.  2. Hypertension and gout in her brother.  3. Diabetes type 2 in her father who is deceased.   SOCIAL HISTORY:  The patient resides at home with her mother, brother  and sister.  She denies any tobacco, alcohol or drug use.  Of note in  several previous dictations by her nephrologist, it is noted that the  patient's home environment is somewhat not conducive to her current  medical state.   MEDICATIONS:  As noted above.  The patient is unclear as to all the  medications that she is taking.  She has got a partial list of  medications with her which include the following:  1. Reglan 10 mg p.o. t.i.d. with meals.  2. Digoxin 125  mcg on Tuesday, Thursday and Saturdays.  3. Levothyroxine 50 mcg daily.  4. She also takes Lantus 30 units subcutaneous h.s.  5. Renagel 3 tablets p.o. t.i.d.  6. Nephrovite 1 tablet p.o. daily.  Please note that this list is not complete as per her recent discharge  summary.  We will obtain the medication list from her hemodialysis  center.   PRIMARY CARE PHYSICIAN:  Maryelizabeth Rowan, M.D., however, the patient  identifies Terrial Rhodes, M.D.,  Nephrology, as her primary care  Maddeline Roorda.   REVIEW OF SYSTEMS:  Twelve systems were reviewed and all systems are  negative except as noted in the history of present illness.   EMERGENCY ROOM COURSE:  The patient upon arrival to the emergency room  had a rectal temperature of 99.3, blood pressure of 147/95, heart rate  of 101, respiratory rate 26 and O2 sats of 100% on 2 liters.   ALLERGIES:  1. ASPIRIN.  2. CODEINE.  3. ACE INHIBITORS.   LABORATORY DATA:  Hemogram shows a white blood cell count of 9.4,  hemoglobin of  15.4, hematocrit of 48.4, platelet count of 236, sodium of  136, potassium 4.7, chloride 97, bicarb 23, BUN 46, creatinine 8.3 and  glucose of 191.  Her digoxin level is low at 0.5.  Urinalysis shows no  elements significant for urinary tract infection.  However, the patient  does have proteinuria and red blood cells noted.   PHYSICAL EXAMINATION:  GENERAL:  The patient is lying in bed in no acute  distress.  She is an obese female.  VITAL SIGNS:  Temperature is 98.7 orally, blood pressure 160/56,  respiratory rate 19, heart rate 97, O2 sats are 100% on 2 liters.  HEENT:  Normocephalic, atraumatic.  Pupils are equally round and  reactive to light and accommodation.  Extraocular movements are intact.  Tympanic membranes are translucent bilaterally with good landmarks.  Oropharynx is moist.  No exudate, erythema or lesions are noted.  NECK:  Trachea is midline.  No masses, no thyromegaly, no JVD, no  carotid bruit.  LUNGS:  Clear to auscultation.  She has got no rales or rhonchi noted.  She is resonant to percussion and there is no increased vocal fremitus.  CARDIOVASCULAR:  She has got a normal S1-S2.  I cannot appreciate any  murmurs, rubs or gallops.  PMI is nondisplaced.  No heaves or thrills on  palpation.  ABDOMEN:  The patient has an obese abdomen.  Normoactive bowel sounds.  Abdomen is nontender, nondistended.  No masses.  No hepatosplenomegaly  noted.  LYMPH NODE SURVEY:  She has got no cervical, axillary or inguinal  lymphadenopathy noted.  MUSCULOSKELETAL:  The patient has a left above-knee amputation.  She has  got no warmth, swelling or erythema around the joints.  There is no  spinal tenderness.  The patient has some mild tenderness in the left  quadratus lumborum with palpation.  NEUROLOGICAL:  Cranial nerves II-XII are grossly intact.  The patient  has no focal neurological deficits noted.  Strength in bilateral upper  extremities are 4+/5 and in the right lower  extremity 4/5.  Hip flexor  strength in the left lower extremity 4-/5.  Sensation is intact to light  touch and proprioception.  PSYCHIATRIC:  The patient is alert and oriented x3.  She has got good  insight and cognition.  Good recent and remote recall.   ASSESSMENT/PLAN:  This is a patient who presents with:  1. Intractable nausea and vomiting.  2. Mild dehydration.  3. End-stage renal disease, hemodialysis dependent.  4. Diabetes type 2 with diabetic gastroparesis.  5. History of coronary artery disease with cardiomyopathy.  6. History of chronic obstructive pulmonary disease, oxygen dependent,      currently stable.  7. Hypertension.  8. Secondary hyperparathyroidism.  9. History of obstructive sleep apnea.   PLAN:  The patient will be admitted and started on IV Reglan in addition  to IV fluids.  I feel that the patient's nausea and vomiting is most  likely secondary to her untreated gastroparesis as the patient has not  been taking her Reglan.  We will assess the patient's response to the  Reglan and antiemetics.  If that improves, we will start the patient on  a diet.  Currently, I am going to leave the patient n.p.o.  I will also  notify the nephrologist about the patient's presence here so that she  can continue with her hemodialysis.  Currently, the patient shows no  decompensation from a cardiac standpoint.  Will continue on her chronic  medications as her oral intake allows.  For her hypertension, we will  start the patient on an IV form of antihypertensive in the form of  labetalol.  We will go ahead and get the patient loaded on digoxin as  her digoxin level is presently subtherapeutic.  For her hypothyroidism,  the patient will be continued on IV thyroxin at one-half of her oral  dose.  A TSH will be checked on this patient.  Further therapy will be  predicated on the patient's clinical course and her response to initial  therapy.   TOTAL TIME:  1 hour.       Altha Harm, MD  Electronically Signed     MAM/MEDQ  D:  10/24/2008  T:  10/24/2008  Job:  [id number]   cc:   Maryelizabeth Rowan, M.D.  Terrial Rhodes, M.D.

## 2010-07-08 NOTE — H&P (Signed)
HISTORY AND PHYSICAL EXAMINATION   Jul 05, 2007   Re:  [name], [name]                 DOB:  1950/08/22   REASON FOR ADMISSION:  Dry gangrene of the right second toe.   HISTORY:  This is a pleasant 60 year old woman who has had multiple  previous vascular procedures.  I have been following an ischemic toe on  the right foot.  This is the second toe.  She has had a previous right  fem-pop bypass graft which is patent.  She has undergone an arteriogram  which shows a patent graft.  However, she has severe tibial occlusive  disease beneath this and, as per my note on 06/14/2007, she is not a  good candidate for any further bypass grafts in her right leg.  She has  had some pain in the right second toe where she has dry gangrene and  came into the office on 07/05/2007 stating she wished to have her toe  taken off because the pain was significant.  Of note, we have previously  had a long discussion about the risk of this nonhealing. However, she  states that she is willing to take this risk.   Of note, she does remember any specific injury to the toe.  She has had  some rest pain in the right foot and is currently non-ambulatory,  although she has plans to get a prosthesis for her AKA on the left.   PAST VASCULAR HISTORY:  Is extensive.  In November of 2004, she had a  left femoral to posterior tibial artery bypass with a composite 6 mm  PTFE lesser saphenous vein graft.  In May of 2005, she had an amputation  of the right first toe.  In November of 2004, she had a ray amputation  of her left great toe.  In February of 2005, she had a right femoral to  below knee pop bypass with a non-reverse vein graft.  In October of 2008  she had a left above-the-knee amputation.  She has also had multiple  previous access procedures and had been seen by Dr. Arbie Cookey, who felt that  she is not a good candidate for right arm graft, which is really her  last long-term access option.   PAST MEDICAL HISTORY:  1. Significant for chronic renal insufficiency.  She dialyzes      Tuesdays, Thursdays, and Saturdays.  2. History of insulin dependent diabetes.  3. History of neuropathy.  4. History of retinopathy.  5. History of gastroparesis.  6. History of coronary artery disease with previous myocardial      infarction.  7. History of ischemic and dilating cardiomyopathy with an ejection      fraction of 20-25%.  8. History of hypertension.  9. History of anemia.  10.History of secondary hyperparathyroidism.  11.COPD.  12.History of pulmonary hypertension.  13.History of asthma.  14.History of depression.  15.History of anxiety.  16.History of gastroesophageal reflux disease.  17.History of stroke.   MEDICATIONS:  1. Burimamide HCl 150 mg tab p.o. daily.  2. Gabapentin 100 mg p.o. 1 to 3 times a day.  3. Renagel 800 mg 1 tablet 3 times a day with meals.  4. Simvastatin 40 mg p.o. nightly.  5. Omeprazole 30 mg p.o. q. a.m.  6. Zyprexa 2.5 mg p.o. nightly.  7. Metoclopramide 10 mg p.o. 4 times daily before meals and at      bedtime.  8. Rena-Vite 1 cap daily.  9. Metoprolol tartrate 25 mg half tab nightly.   ALLERGIES:  Codeine, captopril, aspirin.   SOCIAL HISTORY:  She lives at home currently.  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  She does not use tobacco.   FAMILY HISTORY:  Mother is 60.  She has hypertension and a weak heart.  Father died from complications of diabetes.   REVIEW OF SYSTEMS:  GENERAL:  She had no recent weight loss, weight  gain, or problems with her appetite.  She had no significant weight loss  or weight gain.  HEENT is unremarkable.  CARDIAC:  She has had no recent chest pain, chest pressure, palpitations  or arrhythmias.  PULMONARY:  She does have a history of asthma.  She had no recent  wheezing or bronchitis.  GI:  She has had no recent change in bowel habits and has no history of  peptic ulcer disease.  GU: She does not make significant  urine.  NEUROLOGIC:  She has had a his of stroke in the past.  She has had no  problem with headaches, dizziness, blackouts or seizures.  HEMATOLOGIC:  She has had no bleeding problems or clotting disorders.   PHYSICAL EXAMINATION:  This is a pleasant 60 year old woman who appears  her stated age.  Blood pressure is 153/71, heart rate is 84.   INCOMPLETE DICTATION   [name]. [name], M.D.  Electronically Signed   CSD/MEDQ  D:  07/05/2007  T:  07/06/2007  Job:  [id number]

## 2010-07-08 NOTE — Discharge Summary (Signed)
[name], [name]                 ACCOUNT NO.:  [id number]   MEDICAL RECORD NO.:  [id number]          PATIENT TYPE:  INP   LOCATION:  6703                         FACILITY:  [facility]   PHYSICIAN:  Terrial Rhodes, M.D.DATE OF BIRTH:  1950/08/24   DATE OF ADMISSION:  05/03/2007  DATE OF DISCHARGE:  05/05/2007                               DISCHARGE SUMMARY   DISCHARGE DIAGNOSES:  1. Hyperkalemia in the setting of a missed hemodialysis treatment.  2. End-stage renal disease, on hemodialysis, Tuesdays, Thursdays, and      Saturdays at [facility].  3. Upper extremity tremor, likely related to hypoglycemia versus      extrapyramidal symptoms from metoclopramide.  4. Anemia of chronic disease.  5. Secondary hyperparathyroidism.  6. Depression.  7. Type 2 diabetes mellitus.  8. Left above-the-knee amputation status post gangrene of the left      foot.  9. Coronary artery disease.  10.History of stage IV methicillin-resistant Staphylococcus aureus      infected sacral decubitus ulcer, healing.  11.History of asthma.  12.Ischemic cardiomyopathy with an ejection fraction of 20%-25%.  13.History of pulmonary hypertension.  14.Cerebrovascular disease.  15.Gastroesophageal reflux disease.  16.Gastroparesis.  17.Obstructive sleep apnea syndrome, not on CPAP.   DISCHARGE MEDICATIONS:  1. Metoprolol 12.5 mg by mouth at bedtime.  2. Renagel 800 mg by mouth three times daily with meals.  3. Zocor 40 mg by mouth at bedtime.  4. Prilosec 20 mg by mouth daily.  5. Neurontin 100 mg by mouth three times daily.  6. Wellbutrin XL 150 mg by mouth daily.  7. Nephro-Vite one tablet by mouth daily.  8. Zyprexa 2.5 mg by mouth at bedtime.  9. NPH insulin:  5 units injected subcutaneously in the morning, and 5      units injected subcutaneously in the evening.  10.Zemplar 4 mcg IV every Tuesday, Thursday, and Saturday with      hemodialysis.  11.INFeD 100 mg IV every Thursday with  hemodialysis.  12.Levocarnitine 2000 mg IV every Tuesday, Thursday, and Saturday with      hemodialysis.  13.Vancomycin 1 gram IV every Tuesday, Thursday, and Saturday after      hemodialysis.   Note:  The patient was instructed to stop taking Reglan and reminded of  the decrease in her insulin dose that occurred during this  hospitalization.   CONSULTATIONS:  None.   PROCEDURES:  1. A plain film of the chest on May 03, 2007, demonstrated      cardiomegaly without heart failure.  Low lung volumes were noted.  2. Electrocardiograms obtained during this admission demonstrate      normal sinus rhythm.  Some peaking of T waves was noted shortly      after admission, though this resolved postdialysis.  There was a      slight prolongation of the QRS complex and the QTc is somewhat long      at 484 ms on the most recent tracing.  There were no acute ST      segment or T wave changes to suggest acute ischemia.  ADMISSION HISTORY:  Please see previously dictated admission history and  physical dated May 03, 2007.   ADMISSION LABORATORY:  Again, please see previously dictated history and  physical dated May 03, 2007.   HOSPITAL COURSE:  1. Hyperkalemia in the setting of missed hemodialysis treatment.  The      patient was admitted as outlined in the history and physical dated      May 03, 2007.  While her chief complaint was bilateral upper      extremity tremor, she was noted to be hyperkalemic with a      potassium greater than 7.  Because of this, she was semi-urgently      dialyzed to lower her potassium level.  She was asymptomatic except      for slight peaking of T waves on EKG.  Overall, the patient      received her usual 4 hours of dialysis treatment on the evening of      admission combined with 30 g of Kayexalate, and her potassium on      the day following these interventions was 4.3.  It is somewhat      unclear exactly what caused this acute increase in  potassium, as      the patient denies any diet changes or any new medications.      Although, she did miss her hemodialysis treatment on the day of      admission, there were only a few hours difference between her usual      dialysis treatment and her being admitted to the hospital, not      explaining an acute increase of potassium.  Regardless, the patient      was reminded of the importance of attending her hemodialysis      appointments as scheduled, or to come to the hospital as she did if      she has a severe illness and cannot go to her dialysis treatment.  2. End-stage renal disease, on hemodialysis.  See problem No. 1 above.      The patient will be discharged back to the [facility] for a usual Tuesday, Thursday, and Saturday dialysis.  3. Upper extremity tremor.  The patient was seen and examined with Dr.      Hyman Hopes on May 04, 2007.  Although, she did report a bilateral upper      extremity tremor on admission, the cause remain somewhat unclear.      Initially, her tremor was thought to be related to transient      hypoglycemia, so her NPH was held during this admission, and she      was given sliding scale insulin only.  However, as the patient does      not check her blood sugars at home, we do not have firm evidence      that she was hypoglycemic at the time these tremors began.  Also,      arguing against hypoglycemia is the fact that the tremors have      persisted in spite of normal blood sugars while in the hospital.      Because of the somewhat parkinsonian nature of this tremor, it was      thought that it may be due impart to the patient's longterm use of      metoclopramide for gastroparesis.  Because of this, we stopped the      patient's Reglan during this admission and  recommend holding it      indefinitely.  Another promotility agent such as erythromycin may      be considered in the future, or Reglan may be restarted if her       symptoms of gastroparesis return and become intractable.  But for      now, we will discontinue her Reglan.  4. Type 2 diabetes mellitus.  The patient has a documented history of      type 2 diabetes mellitus, though, her hemoglobin A1c during this      admission is 6.0%.  This indicates either excellent control or      perhaps less insulin need.  Given her questionable symptoms of      hypoglycemia, and the fact that her capillary blood glucose levels      have been very reasonable during this admission, I recommend      decreasing her dose of NPH to 5 units twice daily and would even      consider stopping it in the near future if her symptoms return.      Unfortunately, asking the patient to check her blood sugars at home      does not seem to be a possibility as she is very reluctant to do      so.  5. Positive blood cultures.  As at the time of this dictation, 1 out      of 2 sets of the patient's blood cultures drawn on admission are      growing Gram-positive cocci in clusters.  She was empirically      started on vancomycin on the evening of May 04, 2007, and was      given her second dose after dialysis on May 05, 2007.  Per Dr.      Kathrene Bongo, we will continue vancomycin for 2 weeks as these      cultures were drawn from the patient's hemodialysis catheter and      order posttreatment blood cultures to ensure resolution.  Note that      the patient does not have systemic symptoms including fevers or      chills to suggest a septicemia, and that these Gram-positive cocci      in clusters could simply be contaminant, but the fact that they      have grown from the patient's hemodialysis catheter is concerning.  6. History of depression.  The patient's home dose of Wellbutrin XL      was continued during this admission.  7. Hyperlipidemia.  The patient's home dose of Zocor was continued      during this admission.  8. Hypertension.  The patent's home dose of metoprolol was  continued      during this admission.  9. Gastroesophageal reflux disease.  The patient's home dose of      Prilosec was continued during this admission.  10.History of psychosis.  The patient's dose of Zyprexa was initially      held secondary to a long QT, but will be restarted at discharge.  I      do recommend close following of EKGs in the future to ensure that      her QT does not become increasingly prolonged.   DIALYSIS INSTRUCTIONS:  The patient will be discharged today back to her  usual [facility] for dialysis.  She will be on a 2K  potassium bath with tight heparin.  She is to be dialyzed for 4 hours.  Her estimated dry weight is 97 kg.  Regarding supplementation, she is  not on erythropoietin therapy given her hemoglobin of 13.6.  She is on  Zemplar and INFeD as listed in the discharge medications above.  Her  access is via right Diatek catheter with a blood flow rate of 300.  As  mentioned previously, she will be discharged on vancomycin at 1 g every  Tuesday, Thursday, and Saturday after hemodialysis for 2 weeks with  surveillance blood cultures to be checked after antibiotic therapy  completed.   DISCHARGE LABORATORY:  Laboratory studies on the day of discharge  revealed:  Hemoglobin 13.4, hematocrit 42.3, white blood cell count 8.1,  platelet count 208, MCV 91.9, RDW 19.5.  The patient's renal function  panel is pending at the time of this dictation.   DISCHARGE VITALS:  Vital signs on the day of discharge were:  Temperature 98.1 degrees Fahrenheit, blood pressure 127/56, pulse 77,  respirations 20, and oxygen saturation 96% on room air.  CBGs have been  102, 179, 88, 106, and 188 for the past 24 hours.      Madelaine Etienne, MD  Electronically Signed     ______________________________  Terrial Rhodes, M.D.    JH/MEDQ  D:  05/05/2007  T:  05/06/2007  Job:  [id number]

## 2010-07-08 NOTE — Discharge Summary (Signed)
[name], [name] NO.:  [id number]   MEDICAL RECORD NO.:  [id number]          PATIENT TYPE:  INP   LOCATION:  5523                         FACILITY:  [facility]   PHYSICIAN:  Garnetta Buddy, M.D.   DATE OF BIRTH:  [birth year]   DATE OF ADMISSION:  08/24/2006  DATE OF DISCHARGE:  08/31/2006                               DISCHARGE SUMMARY   DISCHARGE DIAGNOSES:  1. Multifactorial fatigue and dyspnea in the setting of pulmonary      edema/obstructive sleep apnea and pneumonia.  2. Insulin dependent diabetes mellitus.  3. Hypertension.  4. Anemia of chronic disease.  5. Secondary hyperparathyroidism.  #.  GERD.  #.  Cardiomyopathy.  #.  End-stage renal disease.   PROCEDURES:  1. August 25, 2006, limited CT of the paranasal sinuses, impression      negative.  2. August 25, 2006, chest CT with contrast.  Impression:  Cardiac      enlargement and probable patchy pulmonary edema bilaterally.  3. Hemodialysis.   CONSULTATIONS:  Dr. Sharyn Lull   HISTORY OF PRESENT ILLNESS:  [name] is a 60 year old African American  female with a past medical history significant for end-stage renal  disease on chronic hemodialysis, insulin-dependent diabetes mellitus,  coronary artery disease and ischemic dilated cardiomyopathy who presents  to the emergency room complaining of productive cough, dyspnea, and  fatigue over the last 4 days.  She had noticed a fever of greater than  100 and hemodialysis and was given IV antibiotics (question name).  Fatigue, weakness and shortness of breath continued.  Therefore, the  patient presented to the emergency room for evaluation.  She denied  accompanying upper respiratory symptoms, nausea and vomiting or chest  pain.   Admission chest x-ray showed moderate cardiomegaly, alveolar edema  versus infection, left greater than right with probable layering of the  left pleural effusion.   ADMISSION LABORATORY DATA:  WBC 11.7, hemoglobin 12.9,  platelets 303,  potassium 4.2, glucose 222, calcium 9.9, albumin 3.1.   HOSPITAL COURSE:  #1 - MULTIFACTORIAL FATIGUE/DYSPNEA IN THE SETTING OF  PULMONARY EDEMA/OBSTRUCTIVE SLEEP APNEA/IMPOSSIBLE INFILTRATE.  The  patient was admitted for supportive care to include as needed oxygen and  albuterol metered dose inhalers.  The patient was pancultured and was  negative.  She was placed on empiric Avelox for 10 days for possible  underlying pneumonia.  Given the patient's pulmonary edema on chest x-  ray and physical examination findings, her estimated dry weight was  decreased during this admission with a net ultrafiltration of  approximately 12 liters.  She did feel better given the increased  ultrafiltrations.  With known obstructive sleep apnea, the patient did  refuse to wear her CPAP at night time.  Of note, Dr. Sharyn Lull was  consulted on August 26, 2006, in regards to her increasing fatigue and  dyspnea in the setting of her cardiomyopathy.  He did recommend adding a  low dose beta blocker and ACE and repeating a 2-D echo in approximately  2-3 months as an outpatient.  He did also recommend referring the  patient  to EP for ICD.  The patient's blood pressure would not tolerate  both the Coreg and the ramipril, therefore the ramipril was discontinued  this admission.  Again, O2 sats on date of discharge was 96% on room  air.  Blood pressures were stable at 125/71, and the patient overall was  improved symptomatically.   #2 - INSULIN-DEPENDENT DIABETES MELLITUS.  The patient continued with  Accu-Cheks t.i.d. and h.s.  She was covered with her home insulin as  well as sliding scale insulin.  She did remain well controlled this  admission.  There was no further intervention.   #3 - HYPERTENSION.  Please refer to #1.  Please note that the patient  could not tolerate both the beta-blocker and ramipril.   #4 - ANEMIA OF CHRONIC DISEASE.  The patient's hemoglobin remained  stable this  admission.  She did not require any transfusion or retention  in this matter.   #5 - SECONDARY HYPERPARATHYROIDISM.  The patient continued on her IV  vitamin D and her phosphate binders.  Calcium and phosphorus were  monitored closely this admission without intervention.   #6 - GERD.  The patient continued on her Protonix as prescribed.   #7 - CARDIOMYOPATHY WITH EJECTION FRACTION OF 20-25%.  Followed by Dr.  Sharyn Lull.  Again, please refer to #1.  Of note, the patient's blood  pressure was stable just on the Coreg.  The patient is instructed this  admission that she must watch her fluid gains with hemodialysis.  She  cannot tolerate larger fluid gains with an EF of 20% on hemodialysis.   #8 - END-STAGE RENAL DISEASE.  Please see #1.  The patient continued  with her hemodialysis and her electrolytes were checked q. day.   DISCHARGE MEDICATIONS:  The patient is to resume her home medications as  before.  However, she will be on a new dose of Coreg CR 10 mg one p.o.  q.h.s. and Ambien at 10 mg q.h.s.   DISCHARGE INSTRUCTIONS:  The patient is to follow up with Dr. Sharyn Lull.  His office will call her for a follow-up visit and possibly a referral  to electrophysiologist.  The patient will continue with hemodialysis and  continue with low fluid gains secondary to her cardiomyopathy.  She is  to follow her renal drop with 80 grams of protein, 2 grams of salt and 2  grams of potassium with a 1200 mL per day fluid restriction.  The  patient is to increase her activity slowly, and to remain with a  caregiver for 24 hours for the next 2-3 days.      [name], [place]      Garnetta Buddy, M.D.  Electronically Signed    MY/MEDQ  D:  09/30/2006  T:  09/30/2006  Job:  [id number]   cc:   [facility]  [place]. Sharyn Lull, M.D.  [facility]

## 2010-07-08 NOTE — Assessment & Plan Note (Signed)
OFFICE VISIT   [name], [name]  DOB:  September 26, 1950                                       08/09/2007  ZOXWR#:[id number]   The patient had amputation of the right second toe on 07/08/2007.  She  comes in today to have her remaining sutures removed.  Her sutures were  removed without difficulty and the amputation site appears to be healing  adequately.  She has a functioning fem-pop bypass graft in the right  with severe tibial occlusive disease and no further options for  revascularization.   I plan on seeing her back in 3 months.  She has requested a prosthesis  for the left AKA and I have written her a prescription for this for  [employer].  I will see her back in 3 months.   [name]. [name], M.D.  Electronically Signed   CSD/MEDQ  D:  08/09/2007  T:  08/10/2007  Job:  [id number]

## 2010-07-08 NOTE — H&P (Signed)
[name], [name]                 ACCOUNT NO.:  [id number]   MEDICAL RECORD NO.:  [id number]          PATIENT TYPE:  INP   LOCATION:  6712                         FACILITY:  [facility]   PHYSICIAN:  Darryl D. Prime, MD    DATE OF BIRTH:  [birth year]   DATE OF ADMISSION:  05/25/2008  DATE OF DISCHARGE:                              HISTORY & PHYSICAL   CHIEF COMPLAINT:  Nausea and vomiting.   PRIMARY CARE PHYSICIAN:  Maryelizabeth Rowan, M.D.   HISTORY OF PRESENT ILLNESS:  Ms. Hable is a 60 year old female with a  history of end-stage renal disease and dialyzes Tuesday, Thursday,  Saturday via a PermCath in the upper left chest, history of COPD with  recent hospitalization for COPD exacerbation May 04, 2008 to May 06, 2008, who presents with persistent nausea and vomiting.  The patient  notes persistent nausea and vomiting for the last 12 hours or so and  decided to come to the emergency room, nonbilious, nonbloody.  She notes  associated diarrhea as well.  The patient has not been able to eat at  all.  She denies eating out or eating any exotic foods.  No recent  travel.  She denies any sick contacts.  She notes mild shortness of  breath.  No chest pain.  The patient denies having any fever.  She  denies any black or bloody stools.  She has had the  PermCath in place  for the last 6 months.  In the emergency room, she was given Zofran and  KVO fluids.  Last chest x-ray showed significant cardiomegaly with  pulmonary edema.  She has ischemic cardiomyopathy with ejection fraction  in the range of 20%.   PAST MEDICAL/SURGICAL HISTORY:  She has a history as above.  1. History of hypertension.  2. Diabetes.  3. Coronary artery disease with ischemic cardiomyopathy.  4. She has a history of depression.  5. Cataract surgery.  6. History of pancreatitis.  7. History of diskitis L4-L5 on vancomycin which this was discontinued      on May 17, 2008.  8. History of gastroparesis secondary to  diabetes.  9. History of left above-the-knee amputation in 2009.  10.History of COPD, oxygen-dependent.  11.Also has obstructive sleep apnea.  12.History of peripheral vascular disease.  13.She has a history of ventricular tachycardia and ventricular      fibrillation with cardiac arrest.   ALLERGIES:  1. ASPIRIN which causes hives.  2. CAPTOPRIL causes cough.  3. CODEINE causes a rash.   MEDICATIONS:  1. Lantus 60 units subcutaneous injection at bedtime.  2. Ambien 5 mg p.o. nightly p.r.n.  3. Oxycodone/APAP 5/325 once a day or less.  4. Nephrovite 1 tablet daily.  5. Prilosec 20 mg daily.  6. Renagel 2400 mg p.o. t.i.d. with meals.  7. Colace 100 mg p.o. b.i.d. for constipation.  8. Wellbutrin XL 150 mg p.o. daily.  9. Lopressor 12.5 mg p.o. daily.  10.Zyprexa 2.5 mg p.o. daily.  11.Reglan 10 mg p.o. t.i.d.   SOCIAL HISTORY:  She is divorced.  Lives in an assisted  living facility  in the past, now living in [place] with her mother.  She has no  children.  No alcohol.  No tobacco.  She is on disability.  She has  dialysis at the [facility] on [address] Tuesday,  Thursday, Saturday.   FAMILY HISTORY:  Father had diabetes.  Mother with diabetes and a weak  heart.   REVIEW OF SYSTEMS:  A 14-point review of systems negative unless stated  above.   PHYSICAL EXAMINATION:  VITAL SIGNS:  Temperature is 97 with a blood  pressure of 134/63, pulse of 108, respiratory rate 16, sats 93% on 2  liters nasal cannula.  GENERAL:  She is moaning in mild distress.  NECK:  Supple with no lymphadenopathy or thyromegaly.  Jugular venous  distention is prominent, but there are no carotid bruits.  The  oropharynx is dry.  CARDIOVASCULAR:  Regular rate and rhythm with displacement of the point  of maximal impulse leftward, no murmurs.  LUNGS:  Decreased breath sounds bilaterally.  GI:  Obese, nontender, nondistended.  EXTREMITIES:  Showed a left above-the-knee  amputation right leg without  edema.  NEUROLOGIC:  Nonfocal.  She is alert and oriented x3.   LABORATORY DATA:  PTT of 22, PT 13.1, troponin 0.07.  She has a history  of elevated cardiac markers, CK-MB 1.3 and CK of 77.  Sodium 139,  potassium of 4, chloride 91, CO2 of 28, glucose 219, BUN of 29,  creatinine of 6.89, alk phos 113, AST 28, ALT 16, total protein 7.7,  albumin 3.3, calcium 9.9.  CBC:  Total white count of 7.3, hemoglobin of  16.8, platelets of 208, segs of 69%, lymphocytes 17%.   DIAGNOSTICS:  1. The patient's EKG showed normal sinus tachycardia at 108 beats per      minute, right atrial abnormality and left atrial abnormality noted.  2. X-ray shows significant increase in pulmonary edema.  She has      significant cardiomegaly.  Prior study May 05, 2008.  The      abdominal portion of the exam showed no signs of obstruction or      free air.  3. EKG showed sinus tachycardia with a vent rate of 108 beats per      minute, right atrial abnormality and left atrial abnormality with      nonspecific interventricular conduction delay.  Heart rate is      increased compared to EKG May 06, 2008.   ASSESSMENT/PLAN:  This is a patient with a history of end-stage renal  disease with gastroparesis who is now having persistent nausea,  vomiting.  This was constant all day.  She seems controlled with Zofran.  We will also have to rule out possible infection or acute coronary  syndrome given her advanced diabetes and being that she has a Perma-Cath  in for quite some time. She will be admitted.  We will get Nephrology  for possible dialysis in the morning.  She will be gently hydrated as  she does seem to be hypovolemic clinically even though she has pulmonary  edema.  We will with blood cultures x2 and we will get cardiac markers  x3.  The patient's home meds will be continued except for her insulin  given her inability to take p.o. at this time.  We will give sliding-  scale  insulin at a liberal level.  DVT and GI prophylaxis will be  ordered.      Darryl D. Prime, MD  Electronically Signed     DDP/MEDQ  D:  05/26/2008  T:  05/26/2008  Job:  [id number]

## 2010-07-08 NOTE — Discharge Summary (Signed)
[name], [name]                 ACCOUNT NO.:  [id number]   MEDICAL RECORD NO.:  [id number]          PATIENT TYPE:  INP   LOCATION:  5532                         FACILITY:  [facility]   PHYSICIAN:  Terrial Rhodes, M.D.DATE OF BIRTH:  Jun 03, 1950   DATE OF ADMISSION:  11/02/2006  DATE OF DISCHARGE:  11/09/2006                               DISCHARGE SUMMARY   DISCHARGE DIAGNOSES:  1. Nausea and vomiting and the setting of gastroparesis, now resolved.  2. Major depressive disorder.  3. Left second toe gangrene.  4. Insulin dependent diabetes mellitus.  5. Weakness and deconditioning.  6. Anemia of chronic disease.  7. Hypertension.  8. End-stage renal disease.  9. Severe cardiomyopathy.   PROCEDURES:  Hemodialysis.   CONSULTATIONS:  1. Dr. Antonietta Breach.  2. Dr. Waverly Ferrari.   HISTORY OF PRESENT ILLNESS:  [name] is a 60 year old African-American  female who presented with a 48-hour complaint of persistent nausea and  vomiting.  She states that she has had this before and does not have a  significant history for diabetes-related gastroparesis; however, per her  recent discharge, she is not taking her omeprazole or Reglan as  prescribed.  The patient states she is unable to keep any food down.  She has not taken any medication for relief.  She denies anyone in the  home having similar symptomatology.  She denies abdominal pain, fever or  chills.  She states her last bowel movement was on November 29, 2006, and  it was normal.  The patient is being admitted for further evaluation and  management to home.   ADMISSION LABS:  WBC 9.1, hemoglobin 10.9, hematocrit 33.7, platelets  294, potassium 3.6, BUN 29, glucose 179, calcium 9.3, phosphorus 3.1,  albumin 3.3, total protein 7.2.  Digoxin level 0.7, lipase 33, AST 31,  INR 1.3.   HOSPITAL COURSE:  1. Nausea and vomiting with underlying gastroparesis, now resolved.      The patient was admitted for evaluation and comfort  measure.  The      patient's regimen of omeprazole and Reglan were re-started.  She      also received antiemetics, as needed.  The patient's symptoms were      resolved within 24 hours of admission, and there were no further      interventions.  2. Major depressive disorder.  During admission, the patient became      very anxious, as well as withdrawing symptoms.  Physical therapy      was consulted for weakness as will be discussed below, and the      patient refused to cooperate with physical therapy.  On September      10th, Dr. Antonietta Breach was consulted.  The patient was diagnosed      with psychotic disorder, depression, and a mood disorder.  At that      time, she was left on Celexa at her current dose of 20 mg in the      morning.  He also recommended low stimulation ego support.  He also      recommended 24-hour care  for the patient, and it was decided that      the patient may require was placement in a psychiatric ward.  The      patient was re-evaluated on September 11th, and it was decided that      she did not need a psychiatric ward.  However, she would need 24-      hour supervision at a skilled nursing facility.  Dr. Anne Hahn and      also recommended followup at one of the outpatient clinic.  Per Dr.      Providence Crosby recommendation, the patient was started on Zyprexa on      September 13th, in addition to her Celexa.  By the end of the      admission, the patient began to cooperate more fully with physical      therapy.  It is noted that patient has used vomiting in the past,      to get admitted to the hospital on various occasions.  3. Left second toe gangrene.  Initially, it was thought that the      patient's toe was contributing to her non-cooperation with physical      therapy.  Therefore, Dr. [name] was consulted on September 10th.      He did not feel that this was a contributing factor; however,      states that the toe began to worsen.  The patient would  require a      below-knee-amputation, in the near future.  He followed the patient      as appropriately, but no intervention was performed.  4. Insulin-dependent diabetes mellitus.  The patient's blood sugars      did range in the 300s early in the admission, and 70/30 insulin was      titrated.  Sliding scale insulin was added.  Also, diabetes      recommendation coordinator was consulted on September 10, with      recommendations for treatment.  5. Weakness/deconditioning.  Please see number #2.  As stated, the      patient will require 24-hour supervision and continued physical      therapy in a skilled nursing facility.  6. Anemia of chronic disease.  Hemoglobins were maintained at goal in      the 11s, the patient continued on IV Aranesp.  7. Hypertension.  The patient continued on Lopressor.  Blood pressures      were stable with UF and medications.  8. End-stage renal disease.  The patient underwent hemodialysis on her      Tuesday, Thursday, Saturday schedule.  No interventions.  9. Severe cardiomyopathy.  The patient continued on Coumadin therapy,      as was followed closely by pharmacy.   DISCHARGE MEDICATIONS:  1. Nephro-Vite p.o. daily.  2. Lanoxin 0.125 mg on Tuesday, Thursday, Saturdays, after dialysis.  3. Lopressor 12.5 mg p.o. b.i.d.  4. Coumadin 4 mg q.h.s.  5. Zocor 40 mg daily.  6. Protonix 40 mg q.h.s.  7. Reglan 10 mg, one p.o. 15 minutes prior to every meal and at      bedtime.  8. PhosLo 667 mg, two p.o. t.i.d. a.c.  9. Celexa 20 mg one p.o. q.h.s.  10.Zyprexa 5 mg n.p.o. daily.  11.Colace 200 mg p.o. q.h.s.  12.Ativan 0.5 mg p.o. q.12 h. p.r.n. anxiety.  13.Claritin 10 mg p.o. daily.  14.Insulin 70/30, 46 units in the a.m. and 32 units in the p.m.  15.Phenergan 12.5 mg one p.o.  q.6 h. p.r.n. nausea and vomiting.  16.Hemodialysis orders.  The new estimated dry weight is 111.5 kg.      She is to dialyze on a two potassium bath on standard heparin.  She       dialyzes for 4 hours.  She will receive Zemplar at 5 mcg q.      dialysis treatment.  She is to receive no Epogen or iron at this      time.  The patient will receive a PT/INR on next dialysis treatment      on November 12, 2006.   DISCHARGE INSTRUCTIONS:  The patient will be discharged to [facility]  health care on a 90 grams of protein, 2 grams of salt, 2 grams potassium  diet.  She needs to receive 1 can of Nepro at 240 mL, three times a day.  She also  needs to be on a diabetic carb-modified diet.  She will continue with  physical therapy to help with immobilization.  She will dialyze in [facility] on Tuesdays, Thursdays and Saturdays.   DISCHARGE LABS:  Hemoglobin 12.2, potassium 4.1, phosphorus 3.5.  PT/INR  2.7, calcium 9.8, albumin 3.1.      Azucena Fallen, PA    ______________________________  Terrial Rhodes, M.D.    MY/MEDQ  D:  11/09/2006  T:  11/09/2006  Job:  [id number]   cc:   [facility]  [facility]

## 2010-07-08 NOTE — H&P (Signed)
[name], SHUTTER                 ACCOUNT NO.:  [id number]   MEDICAL RECORD NO.:  [id number]          PATIENT TYPE:  INP   LOCATION:  1830                         FACILITY:  [facility]   PHYSICIAN:  Peggye Pitt, M.D. DATE OF BIRTH:  January 30, 1951   DATE OF ADMISSION:  12/08/2007  DATE OF DISCHARGE:                              HISTORY & PHYSICAL   PRIMARY CARE PHYSICIAN:  Maryelizabeth Rowan, M.D.   CHIEF COMPLAINT:  Severe lower back pain.   HISTORY OF PRESENT ILLNESS:  Mrs. Housh is a 60 year old African American  woman with a history of end-stage renal disease, diabetes, hypertension  and the rest as noted below, who presents with lower back pain.  Unfortunately, she is a very poor historian and is shaking in bed  secondary to what she calls a muscle spasm.  She states the lower back  pain began last evening while she was getting ready for bed.  She  describes it as severe 12/10 in intensity over her lower central  lumbar spine.  She denies any fevers, chills, any weakness or any  urinary incontinence.  She also denies any chest pain or any other  symptoms.   ALLERGIES:  SHE HAS STATED ALLERGY TO ASPIRIN.   PAST MEDICAL HISTORY:  Significant for :  1. End-stage renal disease on hemodialysis Tuesday, Thursday,      Saturday.  2. Type 2 diabetes.  3. Hypertension.  4. Peripheral vascular disease.  5. COPD.  6. Coronary artery disease.  7. Obstructive sleep apnea.   HOME MEDICATIONS:  1. Zyprexa 2.5 mg p.o. nightly.  2. Bupropion 150 mg p.o. daily.  3. Metoprolol 12.5 mg p.o. daily.  4. Omeprazole 20 mg p.o. daily.  5. Reglan 10 mg 30 minutes q.a.c. and  nightly.  6. Simvastatin 40 mg p.o. nightly.  7. Neurontin 100 mg p.o. t.i.d.  8. Renagel 800 mg p.o. t.i.d.  9. Nephrovite 1 tablet p.o. daily.  10.She also takes an unknown insulin at an unknown dose.   SOCIAL HISTORY:  She denies any alcohol, tobacco or illicit drug use.  Lives at home with her mother.  Does not have any  children.   FAMILY HISTORY:  Noncontributory.   REVIEW OF SYSTEMS:  Negative except as per HPI.   PHYSICAL EXAMINATION:  VITAL SIGNS:  Upon admission, blood pressure  132/72, heart rate 92, respirations 18, O2 saturation is 93% on room air  with a temperature of 98.6.  GENERAL:  She is alert, awake, oriented x3.  Is in severe distress  secondary to her lower back pain.  Is shaking in bed.  HEENT:  Normocephalic, atraumatic.  Her pupils are equal, reactive to  light and accommodation with intact extraocular movements.  NECK:  Supple.  There is no JVD, no lymphadenoapthy, no bruits or  goiter.  HEART:  Regular rate and rhythm with no murmurs, rubs or gallops  auscultated.  LUNGS:  Clear to auscultation bilaterally.  ABDOMEN:  Obese, soft, nontender, nondistended.  Positive bowel sounds.  BACK:  Unfortunately I am unable to make her turn on her side or on her  stomach so that I can evaluate her back, but upon palpation of her lower  lumbar spine, the central, she does wince in pain.  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  She does not have any edema on her right lower extremity.  NEUROLOGICAL EXAM:  Her cranial nerves II-XII are intact.  Her mental  status is intact.  Her DTRs are 2+, symmetric.  Her muscle strength is  5/5 bilateral upper extremities and maybe about a 4+ on her right lower  extremity.  She is able to wiggle her toes.  Babinski is downgoing on  her right.  Her finger-to-nose is normal.  I have not ambulated her.   LABORATORIES UPON ADMISSION:  WBC is 9.5, hemoglobin 13.4, platelets are  clumped.  She has a sed rate of less than 10.  She had a thoracic spine  x-ray that showed no fracture with early degenerative spondylosis as  well as a lumbar spine x-ray that showed degenerative disk disease and  degenerative spondylosis, aortic calcifications and mild fecal  distention of the colon.   ASSESSMENT/PLAN:  1. Severe low back  pain.  I doubt cauda equina syndrome given absence      of urinary incontinence or lower extremity weakness or fecal      incontinence.  She has no evidence of vertebral fracture on x-ray.      However, given her severe pain, I will check an MRI to further      evaluate.  There is always a question of an epidural abscess or in      infected disk space, but with absence of instrumentation plus no      signs of infection, I think this is unlikely.  However, I believe      the MRI is warranted given her severe low back pain.  I will treat      her with high doses of IV Dilaudid for now.  2. End-stage renal disease on hemodialysis Tuesday, Thursday,      Saturday.  We will continue her home medications and Nephrology to      see while she is in the hospital.  This consultation will be      obtained in the morning.  3. Type 2 diabetes.  We will check a hemoglobin A1c and place on      sliding scale insulin while in the hospital.  4. For her hyperlipidemia, we will continue her statin and check a      fasting lipid profile in the morning.  5. Prophylaxis while in the hospital.  Place on Protonix for      gastrointestinal prophylaxis and on Lovenox for deep vein      thrombosis prophylaxis.      Peggye Pitt, M.D.  Electronically Signed    EH/MEDQ  D:  12/08/2007  T:  12/08/2007  Job:  [id number]   cc:   Maryelizabeth Rowan, M.D.

## 2010-07-08 NOTE — Assessment & Plan Note (Signed)
OFFICE VISIT   [name], [name]  DOB:  1950/10/03                                       07/19/2007  ZOXWR#:[id number]    She had amputation of the right second toe on 07/08/2007.  She has a  functioning right fem-pop bypass graft with severe tibial occlusive  disease and no further options for revascularization.  She comes in for  a routine wound check.  The toe amputation site so far looks reasonable.  There is really no erythema or drainage.  It is too early to take the  sutures out and I plan on seeing her back in 2 weeks for suture removal.  She knows to call sooner if she has problems.  She will keep a dry  dressing on her wound but home health is no longer following her.   [name]. [name], M.D.  Electronically Signed   CSD/MEDQ  D:  07/19/2007  T:  07/20/2007  Job:  [id number]

## 2010-07-08 NOTE — H&P (Signed)
[name], TITSWORTH                 ACCOUNT NO.:  [id number]   MEDICAL RECORD NO.:  [id number]          PATIENT TYPE:  INP   LOCATION:  5532                         FACILITY:  [facility]   PHYSICIAN:  Terrial Rhodes, M.D.DATE OF BIRTH:  Feb 18, 1951   DATE OF ADMISSION:  11/02/2006  DATE OF DISCHARGE:                              HISTORY & PHYSICAL   CHIEF COMPLAINT:  Nausea and vomiting with generalized pruritus.   HISTORY OF PRESENT ILLNESS:  This is a 60 year old African American  female with end stage renal disease (hemodialysis Tuesdays, Thursdays  and Saturdays at the [facility]).  Patient is  complaining of persistent nausea and vomiting for the past 48 hours.  She states that she has had this before and she does have a significant  history for diabetic related gastroparesis; however, per her recent  discharge, she is not taking her omeprazole or Reglan as prescribed.  She states she has been unable to keep any food down.  She has not taken  any medications for relief.  She denies anyone in the home having  similar symptomatology.  She denies any kind of abdominal pain, fever or  chills.  She states her last bowel movement was on October 30, 2006 and  it was normal at that time for the patient.  She is being admitted for  further evaluation and management.   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  Coronary artery disease with history of myocardial infarction x3.  Ischemic and dilated cardiomyopathy with an ejection fraction of 20-25%.  Hypertension, anemia, secondary hyperparathyroidism, COPD, pulmonary  hypertension, asthma, depression, anxiety, GERD, CVA and peripheral  vascular disease, status post bilateral great toe amputation.   ALLERGIES:  CODEINE AND CAPTOPRIL, AS WELL AS ASPIRIN.   CURRENT MEDICATIONS:  The patient is unable to recall her  current  medications; however, this is the medication list from the outpatient  hemodialysis center as follows:  1. Coumadin 3 mg p.o. daily.  2. Celexa 20 mg p.o. daily.  3. Digoxin 0.125 mg every other day after hemodialysis treatment.  4. Metoprolol 12.5 mg p.o. b.i.d. held mornings of dialysis.  5. Oxygen p.r.n. at home.  6. Lorazepam 0.5 mg p.o. b.i.d. p.r.n.  7. 70/30 insulin 40 units q.a.m. and 28 units subcu q.p.m.  8. Rena-Vite 1 p.o. daily.  9. Reglan 10 mg p.o. q.a.c. and h.s.  10.Zocor 40 mg daily.  11.Omeprazole 20 mg p.o. daily.  12.PhosLo 667 mg 1 tablet p.o. t.i.d. with meals.   HEMODIALYSIS MEDICATIONS:  1. Heparin 16109 units IV q hemodialysis treatment.  2. Hemodialysis prescription, frequency Tuesdays, Thursdays and      Saturdays with an estimate dry weight of 116.5 kg.  Treatment      length is 4 hours.  Hemodialysis access is a left upper extremity      AV graft.   SOCIAL HISTORY:  The patient lives in a house with her mother, sister  and brother.  She is currently disabled; however, she was a  former cook.  She is single with no children.  She denies any alcohol, tobacco, or  illicit drug use.   FAMILY HISTORY:  The patient's mother is 66 years old.  She suffers from  hypertension and a weak heart.  Patient father is deceased and did  suffer from diabetes mellitus.  Patient has 1 brother with hypertension  and 1 sister with hypertension.   REVIEW OF SYSTEMS:  GENERAL:  The patient states she has had a decreased  appetite the past couple of days.  She denies any fever, chills,  sweating, weight change, adenopathy or fatigue.  HEENT:  Patient does  have some vision problems and wears corrective lenses at all times.  She  also has full upper and lower dentures.  She denies any headache,  rhinorrhea, vertigo, photophobia or hearing loss.  DERMATOLOGIC:  Patient states she has had generalized pruritus that has been persistent  for at least 1 month.  She  denies any active rashes or lesions.  CARDIOPULMONARY:  She states she does have occasional shortness of  breath and wears oxygen at home.  This is normal for her and this  shortness of breath is no worse than her baseline.  She denies any chest  pain, dyspnea on exertion, orthopnea, PND, edema, palpitations,  claudications, coughing or wheezing.  ENDOCRINE:  Denies polyuria,  dipsia, heat or cold intolerance.  GU:  Denies any frequency, urgency,  dysuria, hematuria, nocturia.  NEURO/PSYCH:  Patient states she does  have some generalized weakness that has been persistent since the nausea  and vomiting began 2 days ago.  She also has a history of depression and  anxiety.  She denies any current numbness.  MUSCULOSKELETAL:  She denies  any muscle or joint pains, or swelling.  GASTROINTESTINAL:  Patient does  have nausea and vomiting as stated above.  Denies any diarrhea or bright  red blood per rectum, hematochezia, melena, dysphagia, omophagia,  abdominal pain or GERD.  As stated above, the patient's last bowel  movement was on October 30, 2006.  She usually has bowel movements  daily and requires no medications to assist.   PHYSICAL EXAMINATION:  VITAL SIGNS:  Temperature 97.7, pulse 85,  respirations 24, blood pressure 125/72.  Oxygen saturation 94% on room  air.  GENERAL:  This is an alert and oriented, ill-appearing female, who is  nauseated and dry heaving at the time of examination.  HEENT:  Head is normocephalic and atraumatic.  EOM intact.  Sclera clear  with bilateral arcus senilis.  Nares are patent without discharge or  edema.  Mucous membranes are moist.  Dentures are in good repair.  Oropharynx is without erythema or exudate.  NECK:  Supple without lymphadenopathy, thyromegaly, bruit or JVD.  CARDIOVASCULAR:  Regular rate and rhythm with distant heart sounds and a  grade II/VI systolic murmur present.  No rubs, gallops or clicks  present.  Pulses are 1+ and regular.   RESPIRATORY:  Lung sounds are clear throughout without wheezes, rales or  rhonchi.  DERMATOLOGIC:  No rashes or lesions present.  GI:  Abdomen is soft, nontender, obese with positive bowel sounds in all  4 quadrants.  No rebound or guarding present.  No organomegaly or masses  present.  EXTREMITIES:  No cyanosis, clubbing, edema, rashes, lesions or petechiae  present.  HEMODIALYSIS ACCESS:  The patient has a left upper extremity AV graft  with positive thrill and bruit.  MUSCULOSKELETAL:  No joint deformity, effusions or spinal tenderness.  NEUROLOGIC:  Alert and oriented x3 with cranial nerves intact.  No  asterixis present.   LABORATORY DATA:  WBC 9.1, hemoglobin 10.9, hematocrit 33.7, platelets  294.  Sodium 137, potassium 3.6, chloride 100, CO2 22, BUN 29,  creatinine 9.61, glucose 179, calcium 9.3, phosphorus 3.1, albumin 3.3,  total protein 7.2.  Digoxin level 0.7.  AST is 31, lipase 33, PT 16.5,  INR 1.3.   ASSESSMENT/PLAN:  1. Nausea and vomiting with history of diabetic related gastroparesis.      The patient will be admitted.  We will resume her outpatient      prescribed regimen of omeprazole and Reglan therapy.  We will also      administer antiemetics as needed.  2. Hypertension.  We will continue the patient's metoprolol therapy      with parameters to hold if hypotension is present.  3. Anemia.  The patient will start on some low-dose Epogen therapy.      She requires no IV iron at this time.  4. Secondary to hyperparathyroidism.  The patient will continue her      outpatient regimen of PhosLo.  No vitamin D therapy at this time.  5. Gastroesophageal reflux disease.  The patient will continue proton      pump inhibitor during her hospital stay.  6. Coronary artery disease with history of ischemic and dilated      cardiomyopathy with last ejection fraction 20-25%.  The patient      will continue her Coumadin and digoxin per her cardiologist      recommendations.  Her  INR is subtherapeutic at the time of      admission and we will ask pharmacy to assist with her Coumadin.  7. Chronic obstructive pulmonary disease/pulmonary      hypertension/asthma.  The patient will continue oxygen as needed.  8. Diabetes mellitus.  The patient will be placed on Accu-Cheks a.c.      and h.s. with sliding scale insulin coverage.  We will continue her      outpatient 70/30 regimen during her hospitalization with close      monitoring of her blood glucose level.  9. Generalized pruritus.  The patient will begin Zyrtec therapy and      will also continue Sarna lotion p.r.n.  No localized rashes      identified upon physical exam.  10.Depression/anxiety.  The patient will continue Celexa and lorazepam      per her outpatient regimen.  11.Disposition.  The patient will return home with her mother, brother      and sister at the time of discharge.      Tracey P. Sherrod, NP    ______________________________  Terrial Rhodes, M.D.    TPS/MEDQ  D:  11/02/2006  T:  11/02/2006  Job:  [id number]

## 2010-07-08 NOTE — Assessment & Plan Note (Signed)
OFFICE VISIT   [name]  DOB:  1951-02-10                                       09/15/2006  ZOXWR#:[id number]   I saw the patient in the office today for continued followup of her left  second toe wound.  Since I saw her last, just over the last few days,  she has had some pain in the toe.  There has been a small amount of  drainage on her sock.  She has had no fever and no erythema.  She has  received a couple of doses of antibiotics at the time of dialysis.   REVIEW OF SYSTEMS:  She has had no chest pain, chest pressure, or  palpitations.   PHYSICAL EXAMINATION:  She has a monophasic posterior tibial and  peroneal signal with the Doppler.  The tip of the second toe is black  with minimal drainage.  There is no erythema currently.   She has a chronically occluded bypass graft on the left with no further  options for revascularization.  I have explained if we do a toe  amputation I think there is a very significant chance that this will not  heal, and then she will simply result with a wound further back.  Obviously, if this does not heal, she would require a primary  amputation, a BKA or AKA.  For this reason, we will continue to follow  the wound.  The pain is tolerable and she has no fever or erythema.  I  have given a prescription for 40 Tylox.  I plan on seeing her back in 2  months.  She knows to call sooner if she has problems.   [name]. [name], M.D.  Electronically Signed   CSD/MEDQ  D:  09/15/2006  T:  09/16/2006  Job:  [id number]

## 2010-07-08 NOTE — Consult Note (Signed)
[name], [name] NO.:  [id number]   MEDICAL RECORD NO.:  [id number]          PATIENT TYPE:  INP   LOCATION:  5532                         FACILITY:  [facility]   PHYSICIAN:  Antonietta Breach, M.D.  DATE OF BIRTH:  20-Sep-1950   DATE OF CONSULTATION:  11/03/2006  DATE OF DISCHARGE:                                 CONSULTATION   REQUESTING PHYSICIAN:  Terrial Rhodes, M.D.   REASON FOR CONSULTATION:  Severe depression.   HISTORY OF PRESENT ILLNESS:  [name] is a 60 year old female  admitted to the [facility] on November 02, 2006, with nausea,  vomiting and __________ .   The patient has been expressing odd affect.  She has been rocking back  and forth on her bed and chanting prayers in a ritualized fashion.  When  the undersigned visited, the staff reported that the patient had refused  her medicines this morning and had been refusing much of her other p.o.   The patient will deny that she is having any hallucinations.  However,  during the same period that she is rocking back and forth on her bed,  she is not verbalizing and is mostly mute.  She has an incongruent,  slight smile.  She will nod her head or move it side to side when asked  yes or no questions.  When asked if there is anything that she could  be help with, she nods her head no.   The patient does appear to be oriented to self.   PAST PSYCHIATRIC HISTORY:  [name] does have a history of major  depression and has been treated with Celexa.   The patient has a history of several weeks of depressed mood, anhedonia,  difficulty concentrating, low energy.  She has also been treated for  feeling on edge, muscle tension and insomnia.   The patient has been given Ativan in the past to help with her anxiety  symptoms.   On review of the past medical record, previous psychotropics have  included Wellbutrin, Seroquel.  The Seroquel was listed at 25 mg q.h.s.  in the past medical record,  and it is unclear whether hallucinations or  delusions were involved when the Seroquel was ordered.   FAMILY PSYCHIATRIC HISTORY:  None known.   SOCIAL HISTORY:  The patient is of the [employer] religion.  Marital  status:  Divorced.  Children:  None.  Occupation:  Medically retired and  disabled.  She does not use any alcohol or illegal drugs.  She was  living with her mother.  The patient was a cook prior to becoming  medically disabled.   PAST MEDICAL HISTORY:  1. Insulin-dependent diabetes mellitus with neuropathy, retinopathy,      gastroparesis.  2. Coronary artery disease with a history of myocardial infarction      three times.  The patient does have ischemic dilated      cardiomyopathy.  Ejection fraction has been 20-25%.  3. Hypertension.  4. Anemia.  5. Hyperparathyroidism.  6. COPD.  7. Pulmonary hypertension.  8. Asthma.  9. Gastroesophageal reflux  disease.  10.She has a history of cerebrovascular disease and a CVA.  11.Peripheral vascular disease.  12.She has had amputation of bilateral great toes.  13.She has end-stage renal disease and is on hemodialysis three times      a week.   ALLERGIES:  ASPIRIN, CAPOTEN and CODEINE.   MEDICATIONS:  The MAR is reviewed.  The patient is on the psychotropics  Celexa 20 mg daily as well as Ativan 0.5 mg b.i.d. p.r.n. and Restoril  15 mg q.h.s. p.r.n.   LABORATORY DATA:  WBC 8.6, hemoglobin 11.5, platelet count 306.  Sodium  137, testing 3.6, chloride 100, bicarb 22, BUN 29, creatinine 9.61,  glucose 179, SGOT 31, SGPT 17, albumin 3.3, calcium 9.3.   Head CT without contrast showed on October 19, 2006, no acute traumatic  injury.  There was a remote left frontal lobe infarct and diffuse  atherosclerotic vascular calcification.   REVIEW OF SYSTEMS:  CONSTITUTIONAL:  Afebrile.  No weight loss.  HEAD:  No trauma.  EYES:  No visual changes.  EARS:  No hearing impairment.  NOSE:  No rhinorrhea.  MOUTH/THROAT:  No sore throat.   NEUROLOGIC:  No  new focal motor or sensory losses.  PSYCHIATRIC:  As above.  CARDIOVASCULAR:  No chest pain, palpitations.  RESPIRATORY:  No coughing  or wheezing.  GASTROINTESTINAL:  As above.  GENITOURINARY:  The patient  is on the renal floor with the dialysis unit just down the hallway.  SKIN:  Unremarkable except for general pruritus complaints.  She is not  complaining at all at this time of somatic __________ ; however, she has  become he is essentially mute.  HEMATOLOGIC/LYMPHATIC:  Anemia.  MUSCULOSKELETAL:  Amputations as above, otherwise no deformities.  ENDOCRINE/METABOLIC:  No heat or cold intolerance.   EXAMINATION:  VITAL SIGNS:  Temperature 97.6, pulse 80, respirations 16,  blood pressure 121/68, O2 saturation on room air 96%.  CBG 155.  Weight  114 kg, height 66 inches.  GENERAL APPEARANCE:  [name] is a middle-aged female sitting up in her  hospital bed rocking back and forth, chanting a repeated prayer.  She  does stop the chanting and looks at the undersigned with a slight smile.  She mouths a very short no when asked if she is having hallucinations.  However, she is then mute.  She does not have any abnormal involuntary  movements.  She is able to stop the rocking back and forth during the  interview.  Her eye contact is good and she is alert.  OTHER MENTAL STATUS EXAM:  The patient's attention span is decreased.  She clearly appears to be distracted by internal stimuli when assessing  thought content.  Also she has an inappropriate affect with a slight  smile.  She will not answer specific detailed mental status questions  such as to assess memory, orientation, abstraction or calculation.  Her  judgment is obviously impaired.  Her insight is poor.  Her affect as  above.  Her fund of knowledge and intelligence are not assessable;  however, grossly they appear to be below that of her estimated premorbid  baseline.  Speech as described.  The patient will not perform  memory  assessment.  She is not combative.   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  293.83, mood disorder not otherwise specified.  296.33,  major depression.  AXIS II:  Deferred.  AXIS III:  See past medical history above.  AXIS IV:  General medical.  AXIS V:  20.   RECOMMENDATIONS:  1. Would check an EKG for her QTC.  If less than 500 sec, msec would      start Zyprexa 5 mg p.o. or IM daily for antipsychosis,      antiagitation.  Would recheck the QTC to confirm that it is less      than 500 msec.  If the QTC rises above 500 msec, would stop the      Zyprexa.  2. Would continue the Celexa at current dose 20 mg q.a.m.  3. Would use low-stimulation ego support.  4. Preliminary discharge planning:  If the patient is medically      cleared before her psychiatric symptoms are resolved, anticipate      that she will need admission to an inpatient psychiatric unit.      Antonietta Breach, M.D.  Electronically Signed     JW/MEDQ  D:  11/04/2006  T:  11/04/2006  Job:  [id number]

## 2010-07-08 NOTE — Consult Note (Signed)
[name], [name] NO.:  [id number]   MEDICAL RECORD NO.:  [id number]          PATIENT TYPE:  INP   LOCATION:  5503                         FACILITY:  [facility]   PHYSICIAN:  Antonietta Breach, M.D.  DATE OF BIRTH:  February 18, 1951   DATE OF CONSULTATION:  10/15/2006  DATE OF DISCHARGE:                                 CONSULTATION   REFERRING PHYSICIAN:  Bethann Berkshire, M.D.   REQUESTING PHYSICIAN:  Terrial Rhodes, M.D.   REASON FOR CONSULTATION:  Depression.   HISTORY OF THE PRESENT ILLNESS:  The patient is a 60 year old female  admitted to the [facility] on October 12, 2006 due to shortness of  breath and diffuse itching.  The patient has several general medical  problems; please see the past medical history below.   The patient has had approximately 4 weeks of progressive depressed mood  including low energy, difficulty concentrating and increased daytime  somnolence.  She has no thoughts of harming herself or others.  She has  no hallucinations or delusions.  The patient has had difficulty with  nocturnal insomnia and has been treated with lorazepam as an outpatient.   PAST PSYCHIATRIC HISTORY:  The patient does have a history of depression  and anxiety.  Upon review of the past medical record lorazepam has been  used as far back as November 2004.  She was using this for feeling on  edge and muscle tension.  Depression was mentioned in the medical record  of December 2004.  She was discharge from the hospital at bedtime on  Wellbutrin 200 mg daily and Seroquel 25 mg at bedtime.  The Wellbutrin  was discontinued by December 2007; and, the patient continued on Ativan  at that time.   The the patient currently is a limited historian.  She does have  coherent thought process; however, she has poverty of speech.   FAMILY PSYCHIATRIC HISTORY:  Family psychiatric history; none known.   SOCIAL HISTORY:  Marital status; divorced.  The patient has no children.  Occupation; medically disabled.  She does not use alcohol or illegal  drugs.  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  Religion is [employer].  She lives with  her mother.   PAST MEDICAL HISTORY:  1. End-stage renal disease on hemodialysis.  2. Diabetes mellitus Type 2, insulin-dependent.  She has complications      of retinopathy, gastroparesis and neuropathy, coronary artery      disease.  3. The patient has a history of three MIs.  4. The patient has ischemic dilated cardiomyopathy.  5. Hypertension.  6. Iron-deficiency anemia.  7. Secondary hyperparathyroidism.  8. COPD and asthma.  9. Pulmonary hypertension.  10.Cerebrovascular disease with the history of a CVA in 1998.   PAST SURGICAL HISTORY:  The past surgical history includes:  1. Repair of a left ankle fracture; and,  2. Rotator cuff in the left shoulder.  3. She also has undergone a left tibial artery bypass for peripheral      vascular disease.  4. She also underwent a right femoral posterior tibial bypass.  5. 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   ALLERGIES:  ASPIRIN, CAPTOPRIL AND CODEINE.   LABORATORY DATA:  Sodium 134, BUN 50, creatinine 10.02 AND calcium 10.1.  Albumin 2.8.  WBC 7.6, hemoglobin 11.8,  and platelet count 298,000.  Digoxin level was therapeutic at 1.2.  TSH was low at 0.303.  Comprehensive metabolic panel on October 12, 2006 showed the SGOT to be  13 and the SGPT to be 13.   REVIEW OF SYSTEMS:  CONSTITUTIONAL:  Afebrile.  No weight loss.  HEAD:  No trauma.  EYES:  No visual changes.  EARS:  No hearing impairment.  NOSE:  No rhinorrhea.  MOUTH AND THROAT:  No sore throat.  NEUROLOGIC:  No new focal or motor sensory loss.  PSYCHIATRIC:  As above.  CARDIOVASCULAR:  No chest pain or palpitations.  RESPIRATORY:  No  coughing or wheezing.  GASTROINTESTINAL:  No nausea, vomiting or  diarrhea.  GENITOURINARY:  As above; the patient has a regular schedule  of hemodialysis.  SKIN:  Unremarkable.   ENDOCRINE AND METABOLIC:  No  heat or old intolerance.  MUSCULOSKELETAL:  As above.  No other  deformities.  HEMATOLOGIC AND LYMPHATICS:  Anemia.   PHYSICAL EXAMINATION:  VITAL SIGNS:  Temperature 98.3, pulse 78,  respiration rate 18, blood pressure 116/67, and O2 saturation on 2  liters is 94%.  GENERAL APPEARANCE:  The patient is a middle-aged female lying in a  prone position in her hospital bed and turning to the side to make eye  contact with the undersigned.  MENTAL STATUS EXAMINATION:  The patient does have poverty of speech.  She has no abnormal involuntary movements.  She is well-groomed.  Other  mental status exam:  This patient has an obvious constricted affect at  baseline; however, she does produce a partial social smile.  Her mood is  depressed.  On orientation testing she is oriented in all spheres.  Her  memory is grossly intact to immediate, recent and remote.  Her speech is  impoverished and there is a slightly flat prosody; however, there is no  dysarthria.  Speech is soft.  Fund of knowledge and intelligence are  grossly within normal limits.  Thought process is logical, coherent and  goal-directed.  No looseness of associations.  The patient does have an  occasional inappropriate smile.  Thought content:  No thoughts of  harming herself, no thoughts of harming others, no delusions, and no  hallucinations.  Insight is partial.  Judgment is intact.   ASSESSMENT:  AXIS I  1. Mood disorder, not otherwise specified (functional and general      medical factors), depressed.  293.83  2. Anxiety disorder, not otherwise specified.  293.84  3. Rule out major depressive disorder, recurrent, severe.  296.33  AXIS II  Deferred.  AXIS III  See general medical problems.  AXIS IV  General medical.  AXIS V  Fifty-five.   DISCUSSION:  The undersigned provided ego-supportive psychotherapy and  education.  The patient agrees to call emergency services if she  develops any thoughts  of harming herself or has any other psychiatric  emergency symptoms.  The indications, alternatives and adverse effects  of Celexa were discussed with the patient for her antidepression as well  as antianxiety, with the goal of eliminating the need for Ativan.   The patient understands the above information and she wants to think  about the Celexa.   RECOMMENDATIONS:  1. If the patient decides to take  the Celexa I will start it at 10 mg      by mouth every morning  and then increase as tolerated to 20 mg by      mouth every morning as the initial trial dose.  2. The goal of the Celexa therapy would be to eventually eliminate the      need for Ativan.  3. Ideally,  if it can be arranged, the patient would be a good      candidate for cognitive behavioral therapy combined with Celexa.      Preliminary discharge planning with the social worker could involve      setting up an appointment at one of the clinics attached to      [facility] or [facility] for      pharmacotherapy and cognitive behavioral therapy.  4. Would further evaluate thyroid status.      Antonietta Breach, M.D.  Electronically Signed     JW/MEDQ  D:  10/17/2006  T:  10/18/2006  Job:  [id number]

## 2010-07-08 NOTE — Consult Note (Signed)
NAMEGWENDOLEN, [name] NO.:  [id number]   MEDICAL RECORD NO.:  [id number]          PATIENT TYPE:  INP   LOCATION:  2913                         FACILITY:  [facility]   PHYSICIAN:  Doylene Canning. Ladona Ridgel, MD    DATE OF BIRTH:  03/29/1950   DATE OF CONSULTATION:  12/28/2007  DATE OF DISCHARGE:                                 CONSULTATION   REQUESTING PHYSICIAN:  Ricki Rodriguez, MD   RECOMMENDATIONS FOR TREATMENT:  The patient with resuscitated VT/VF  arrest.   HISTORY OF PRESENT ILLNESS:  The patient is a 60 year old woman now with  multiple medical problems including end-stage renal disease on dialysis,  longstanding diabetes complicated by neuropathy, retinopathy and  gastropathy and coronary artery disease who is status post myocardial  infarction.  She has known LV dysfunction with an EF of 25% back in  2002.  The patient also had peripheral vascular disease with a right fem  posterior tibial bypass graft in 2005.  She was in her usual state of  health on dialysis center while she was eating a sandwich approximately  an hour half in the dialysis when she suddenly became unresponsive.  By  report (we do not have strips), the patient was in ventricular  tachycardia and was pulseless.  She was defibrillated and resuscitated.  Details of the resuscitation are not particularly clear at this point,  but she apparently had marked diminish in the pulse and an episode of VF  and was again resuscitated in the emergency department and intubated and  admitted for additional evaluation.  She was placed on the cooling  protocol and has been fairly arrhythmia free.  She underwent  catheterization by Dr. Algie Coffer 1 day ago, which demonstrated severe LV  dysfunction, also severe two-vessel coronary artery disease with  occluded circumflex in right coronary arteries and collateralization of  both.  The patient is referred now for additional evaluation.   Additional past medical history is  notable for obstructive sleep apnea.  She has morbid obesity.  She has secondary hypothyroidism.  She has a  history of longstanding anemia on EPO.  She has a history of depression  and anxiety.  She has a history of multiple arthritic complaints  including the left ankle fracture in 2003.  She had a history of stroke  with resolved symptoms in 1999.   Her family history was not available at the time of this dictation.   SOCIAL HISTORY:  The patient does not use or abuse tobacco or ethanol  and lives with her mother.   Her review of systems also not able to be provided at the time of this  dictation, but she does apparently have dyspnea on exertion as would be  expected.   PHYSICAL EXAM:  GENERAL:  The patient is intubated.  She is minimally  sedated.  VITAL SIGNS:  Her blood pressure is 106/53 (on Levophed).  Her pulse was  76 and regular, respirations are 20.  HEENT:  Normocephalic and atraumatic.  Pupils are equal and round.  Oropharynx is moist with an ET tube in  place.  NECK:  A 7-cm jugular venous distention.  There is no thyromegaly that  could be appreciated.  The trachea was midline.  LUNGS:  Decreased breath sounds.  There are some scattered rales, but no  wheezes or rhonchi could be appreciated.  There is no increased work of  breathing.  CARDIAC:  Distant with a regular rate and rhythm and normal S1 and S2.  ABDOMEN:  Obese, nontender, and nondistended.  There is no organomegaly.  EXTREMITIES:  Warm and minimally edematous.  NEUROLOGIC:  The patient's eyes were open.  When asked to squeeze my  fingers, she complied without difficulty.  She appeared to understand  comments that were made, but of course could not speak because of her ET  tube in place.   EKG demonstrates sinus rhythm with left ventricular hypertrophy and  intraventricular conduction delay.  There are occasional PVCs.   IMPRESSION:  1. Status post resuscitated ventricular tachycardia/ventricular       fibrillation arrest.  2. Ischemic cardiomyopathy.  3. Severe left ventricular dysfunction, ejection fraction      approximately 20% with occluded right coronary artery and left      circumflex coronary artery with collateralization in place,      consistent with chronic disease.  4. Severe peripheral vascular disease.  5. Morbid obesity.  6. End-stage renal disease on hemodialysis.  7. Diabetes.   DISCUSSION:  It remains critically ill.  I also notes that she had  recently been discharged in the hospital with methicillin-resistant  Staphylococcus epidermidis, bacteremia, and diskitis.  While there is no  evidence of any active infection presently, this is certainly an  important finding.  If the patient is able to recover from her cardiac  arrest, we would consider all of the possible treatment options for  secondary prevention of recurrent VT and VF, although at present, she is  not particularly a good candidate for defibrillator therapy either  endovascular or epicardial at this time.  If the patient develops  recurrent ventricular arrhythmias, then intravenous amiodarone would be  very reasonable choice for treatment at least in the short term.  We  will follow the patient with you.  Once again thanks for consultation.      Doylene Canning. Ladona Ridgel, MD  Electronically Signed     GWT/MEDQ  D:  12/28/2007  T:  12/28/2007  Job:  [id number]   cc:   [facility]

## 2010-07-08 NOTE — H&P (Signed)
NAMEDAYSIA, VANDENBOOM NO.:  [id number]   MEDICAL RECORD NO.:  [id number]          PATIENT TYPE:  INP   LOCATION:  3108                         FACILITY:  [facility]   PHYSICIAN:  Eduard Clos, MDDATE OF BIRTH:  1950/05/27   DATE OF ADMISSION:  05/04/2008  DATE OF DISCHARGE:                              HISTORY & PHYSICAL   PRIMARY CARE PHYSICIAN:  Maryelizabeth Rowan, MD   CHIEF COMPLAINT:  Shortness of breath.   HISTORY OF PRESENT ILLNESS:  This is a 60 year old African American  woman with past medical history of diabetes, hypertension, coronary  artery disease, ischemic cardiomyopathy with an EF of 20%, and end-stage  renal disease, on hemodialysis Tuesday, Thursday, Saturday, presenting  with shortness of breath that started last night after coming home from  the hemodialysis session.  She tolerated a 4.5-hour hemodialysis session  well.  There were no changes in medication for her report.  She had no  dizziness, no chest pain, nausea, vomiting, diarrhea, constipation, or  blood loss.  She also denies cough, congestion, sick contacts, or flu-  like illness.  She gets no relief of shortness of breath with sitting up  or bending forward.  She can lie down almost flat without any  complaints.  She has had a similar episode, but worse per her report in  the past in November and December 2009 (per e-chart, she had paroxysmal  V-tach then).  At that time, she had a cath that found a complete  obstruction of RCA and left circumflex and an EF of 15-20%.  An ICD was  postponed then secondary to infection.  She was not considered a  candidate for revascularization.  The patient had a benefit in the ED  from breathing treatments.   PAST MEDICAL HISTORY:  1. End-stage renal disease, on hemodialysis Tuesday, Thursday, and      Saturday.  2. Coronary artery disease with complete occlusion of RCA and left      circumflex per cath in December 2009.  3. Ischemic  cardiomyopathy with an EF of 20% with history of cardiac      arrest with V-tach and V-fib in November 2009.  4. Diabetes type 2 with gastroparesis and left AKA in 2009.  5. COPD, oxygen-dependent OHS and OSA.  6. PVD.  7. History of decubitus ulcer.  8. Depression.  9. Obesity.  10.Cataracts.  11.History of pancreatitis.  12.History of diskitis L4-L5, for which she is on treatment with      vancomycin as it appears for last discharge summary.   MEDICATIONS AT HOME:  1. Lantus 20 units subcutaneous injection nightly.  Please note that      in the last discharge summary, Lantus appears at 60 units, but the      patient states she takes only 20.  2. Ambien 5 mg p.o. nightly p.r.n. insomnia.  3. Oxycodone/APAP 5/325 mg, usually takes one a day or less.  4. Nephro-Vite 1 tablet p.o. daily.  5. Prilosec 20 mg p.o. daily.  6. Renagel 24,000 mg p.o. t.i.d. with meals.  7. Colace 100  mg p.o. b.i.d. p.r.n. constipation.  8. Wellbutrin XL 150 mg p.o. daily.  9. Lopressor 12.5 mg p.o. daily.  10.Zyprexa 2.5 mg p.o. daily.  11.Vancomycin infusion IV in dialysis for diskitis starting February      14 up to May 17, 2008, where in it should be switched to      doxycycline 15 mg once daily per Dr. Theodis Aguas recommendation.  12.Reglan 10 mg p.o. t.i.d.   ALLERGIES:  ASPIRIN gives her itching and per last discharge summary,  nausea and vomiting.   FAMILY HISTORY:  Father with diabetes.  Mother with diabetes and a weak  heart.   SOCIAL HISTORY:  Divorced.  Lives in an assisted living facility for  several months, now living in [place] with her mom.  She has no  children.  No alcohol.  No tobacco.  She is on disability.  She is  having dialysis at the [facility] on [address].  Telephone number [phone].   PHYSICAL EXAMINATION:  VITAL SIGNS:  Temperature 97, pulse 100, blood  pressure 129/78, and oxygen 98% on 2 L.  GENERAL:  She is moaning in distress because of  shortness of breath,  shallow frequent breath.  PULMONARY:  She is clear to auscultation bilaterally.  CARDIAC:  Regular rate and rhythm.  No murmurs, rubs, gallops  appreciated.  GI:  Soft, obese, nontender, and nondistended.  Bowel sounds positive.  EXTREMITIES:  Left AKA, right leg without edema.  NEURO:  Nonfocal.  Alert and oriented x3.   LABORATORY VALUES:  Sodium 138, potassium 4.6, chloride 98, bicarb 27,  BUN 26, creatinine 5.42, glucose 106, bilirubin 0.6, alk phos 111, AST  19, ALT 14, protein 7.8, albumin 2.9, and calcium 9.8.  BNP 551, white  blood count 7.5 with a PMN percentage of 69, hemoglobin 13.9, and  platelets 236.  Chest x-ray, cardiac silhouette very enlarged, but  stable.  Hemodialysis catheter in place.  EKG; normal sinus rhythm,  partial left bundle branch block, PMI trial, poor R-wave progression,  and 1 PVC.   ASSESSMENT AND PLAN:  This is a 60 year old African American woman with  multiple chronic conditions including history of ventricular tachycardia  and ventricular fibrillation cardiac arrest, coronary artery disease,  diabetes, hypertension, congestive heart failure with an ejection  fraction of 15-20% and end-stage renal disease, presenting with  shortness of breath after hemodialysis session.  1. Shortness of breath.  The patient with significant history of ventricular tachycardia and  ventricular fibrillation arrest in the past and in November 2009 with  catheterization performed then showing total obstruction of right  coronary artery and left circumflex, but not a candidate for  revascularization. At that time, the ejection fraction was 15-20% and an  implantable cardioverter-defibrillator was proposed, but postponed  secondary to concurrent infection.  The EKGs now without arrhythmia with  normal sinus rhythm and no signs of ischemia, other than poor R-wave  progression. BMP is also much lower now than compared to last month and  the patient  does not look fluid overloaded, has no crackles or  peripheral edema.  Also, the patient has a history of chronic  obstructive pulmonary disease.  However, she has no congestion, cough,  fever, chills, sick contacts and no crackles and wheezes on exam.  She  is fairly immobile and has multiple comorbidities, so this is possible  pulmonary embolism.  We planned to perform a CT scan of her chest as a  VQ scan is not available due  to lack of the tracer; however, we could  not obtain a good IV access.  We will start heparin empirically until  the CT of the chest is available.  I discussed with Dr. Arlean Hopping about  using the hemodialysis catheter for heparin and per his recommendation,  it should be used for a short term (24-48 hours) for this infusion.  We  will also check PT, PTT, and INR.  We will place the patient in step-  down and we will monitor her strictly for arrhythmia.  We will most  likely need a cardiac consult in the morning for both current symptoms  and also for automatic implantable cardioverter-defibrillator placement.  We will need a Nephrology records from the last hemodialysis session to  see if the estimated dry weight has been decreased or any medications  has been changed.  I tried to call the Hemodialysis Center, but I got no  answer.  We will check cardiac enzymes x3, EKGs now, repeat in a.m., and  ABG.  We will check blood cultures x2, but I doubt infection.  We will  check UDS, but the patient denies use of alcohol, tobacco and drugs, and  we will continue Lopressor.  1. End-stage renal disease.  Dr. Arlean Hopping contacted for hemodialysis      tomorrow and he will add the patient to the list.  She has a left      tunneled internal jugular hemodialysis catheter.  We will continue      Nephro-Vite and Renagel, and will check a renal profile in dialysis      tomorrow.  2. Diabetes type 2.  Hemoglobin A1c will be checked.  We are going to      start her on a Lantus 20.  We  will need to check the dose from the      records from the dialysis center.  We will also start sliding scale      sensitive and we will continue her Reglan for gastroparesis.  3. Chronic obstructive pulmonary disease.  We will keep on oxygen by      nasal cannula and we will add albuterol p.r.n.  4. Obesity hypoventilation syndrome and obstructive sleep apnea.  She      does not wear continuous positive airway pressure at night.  We      will keep her on oxygen.  5. Depression.  Continue Zyprexa.  6. Diskitis.  It is not clear to me whether vancomycin was continued      after discharge, but I believe it was.  She is supposed to get the      vancomycin until March 25 when this should be changed to      doxycycline per Dr. Theodis Aguas recommendation.  However, the patient      grew staphylococcus coagulase negative.  We will need to check with      the Hemodialysis Center is the patient is still on vancomycin.  We      will continue this tomorrow.  7. Prophylaxis.  Protonix and heparin.  8. Poor access.  The patient could not get IV access despite multiple      attempts by the nurse and by the IV Team.  She has a tunneled  left      internal jugular hemodialysis catheter that we can use tonight for      heparin.  However, this should be not to use for more than 1 or 2      days.  News attempts for IV access should be done tomorrow and we      will probably need to use a peripherally inserted central catheter      if we can get no axis or the Radiology Team is not comfortable      pushing contrast through a central line.      Carlus Pavlov, M.D.  Electronically Signed      Eduard Clos, MD  Electronically Signed    CG/MEDQ  D:  05/04/2008  T:  05/05/2008  Job:  [id number]

## 2010-07-08 NOTE — Discharge Summary (Signed)
NAMECALAYA, GILDNER                 ACCOUNT NO.:  [id number]   MEDICAL RECORD NO.:  [id number]          PATIENT TYPE:  INP   LOCATION:  3706                         FACILITY:  [facility]   PHYSICIAN:  Michelene Gardener, MD    DATE OF BIRTH:  14-Apr-1950   DATE OF ADMISSION:  04/09/2008  DATE OF DISCHARGE:  04/12/2008                               DISCHARGE SUMMARY   DISCHARGE DIAGNOSES:  1. Nausea and vomiting related to gastroparesis.  2. Known history of gastroparesis.  3. Elevated troponin.  4. Prior history of cardiopulmonary arrest with ventricular      tachycardia and ventricular fibrillation.  5. Ischemic cardiomyopathy with ejection fraction of 20%.  6. Coronary artery disease with complete occlusion of the right      coronary artery and left complex collaterals.  7. Peripheral vascular disease.  8,  End-stage renal disease and the patient is on hemodialysis on  Tuesday, Thursday, and Saturday.  1. Diabetes mellitus type 2.  2. Chronic obstructive pulmonary disease.  3. Obstructive sleep apnea.  4. Obesity hypoventilation syndrome.  5. History of diskitis in L4-L5.  6. Chronic hypoxemia, and the patient is oxygen dependent.  15,  Secondary hypoparathyroidism.  1. History of pancreatitis.  2. Prior history of sacral decubitus ulcer.  3. Depression.  4. Obesity.   DISCHARGE MEDICATIONS:  Include  1. Lantus 60 units subcutaneously at night.  2. Nephro-Vite 1 tablet once a day.  3. Prilosec 20 mg once a day.  4. Renagel 2400 mg p.o. 3 times with meals.  5. Colace 100 mg twice daily as needed.  6. Wellbutrin XL 150 mg once a day.  7. Lopressor 12.5 mg once a day.  8. Zyprexa 2.5 mg once a day.  9. Vancomycin with hemodialysis for 6 weeks, and it was started on      April 08, 2008 for treatment of diskitis.  10.The patient will be started on doxycycline 50 mg once a day on      May 17, 2008 by Fox after she finishes her vancomycin.  11, Reglan 10 mg 3 times daily  as needed.   CONSULTATIONS:  1. Nephrology consult.  2. Cardiology consult.   PROCEDURES:  None.   RADIOLOGY STUDIES:  Chest x-ray on April 10, 2008 showed no evidence  of acute problem.   COURSE OF HOSPITALIZATION:  1. Nausea and vomiting.  This is most likely secondary to her      underlying gastroparesis.  The patient was admitted to the hospital      because her vomiting has been intractable.  She was kept n.p.o. and      was given a very slight amount of fluids because of her known      cardiomyopathy.  The patient was treated symptomatically with      antiemetics.  The patient improved very well in the hospital.      Liquid diet was started and then was advanced without problems.      Currently at the time of discharge the patient is back to her      baseline.  She will be discharged in her routine, Reglan to be      given as needed.  2. Elevated troponin.  This patient has a complex cardiac history.  At      the time of this admission she had mild shortness of breath where      serial cardiac enzymes were done.  Her enzymes came borderline at      the range of 0.4.  Cardiology evaluation was requested, and      recommendations are pending at the present time.   DISPOSITION:  Otherwise other medical conditions remained stable during  the hospitalization.  Currently the patient is asymptomatic.  She does  not have chest pain.  There is no shortness of breath.  There is no  nausea, and there is no vomiting.  The patient will be discharged back  to the nursing home today on all of her preadmission medications if she  is cleared by cardiology.   Total assessment time is 40 minutes.      Michelene Gardener, MD  Electronically Signed     NAE/MEDQ  D:  04/12/2008  T:  04/12/2008  Job:  [id number]

## 2010-07-08 NOTE — Assessment & Plan Note (Signed)
OFFICE VISIT   [name], [name]  DOB:  09/27/1950                                       04/15/2007  JYNWG#:[id number]   HISTORY:  The patient presents today for evaluation of AV access  placement.  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  She does have palpable  right popliteal graft pulse.  She reports severe pain in her right knee  area for the past 3 weeks and I have reassured her that this is not  related to arterial insufficiency.  She did undergo vein mapping of her  right arm and it does show reasonable caliber in her cephalic vein from  the level of the antecubital space proximally.  She does, however, have  no radial or brachial pulse.  She reports that she does run low blood  pressure.   I have discussed the options with the patient and clearly do not feel  that she is a candidate for right arm access as she does not have a  palpable pulse.  She is being dialyzed via a right IJ Diatek catheter  placed by Dr. [name] earlier this month and has not had any difficulty.  I feel that her best bet will be chronic catheter use due to multiple  failed prior left arm grafts and no brachial pulse in the right.   Larina Earthly, M.D.  Electronically Signed   TFE/MEDQ  D:  04/15/2007  T:  04/16/2007  Job:  [id number]   cc:   [facility]

## 2010-07-08 NOTE — Group Therapy Note (Signed)
NAMEBRINNLEY, LACAP                 ACCOUNT NO.:  [id number]   MEDICAL RECORD NO.:  [id number]          PATIENT TYPE:  INP   LOCATION:  6729                         FACILITY:  [facility]   PHYSICIAN:  Peggye Pitt, M.D. DATE OF BIRTH:  1950/04/28                                 PROGRESS NOTE   PRIMARY CARE PHYSICIAN:  Dr. Maryelizabeth Rowan.   DIAGNOSES UP TO DATE:  1. Coagulase-negative staph bacteremia with L4-L5 diskitis.  2. End-stage renal disease on hemodialysis.  3. Type 2 diabetes.  4. Hypertension.  5. Peripheral vascular disease.  6. Chronic obstructive pulmonary disease.  7. Obstructive sleep apnea.   MEDICATIONS TO DATE:  1. Vancomycin IV, dosed per pharmacy.  2. Wellbutrin 150 mg p.o. daily.  3. Aranesp 600 mcg on Mondays.  4. Lovenox 40 mg subcutaneous daily.  5. Neurontin 100 mg p.o. t.i.d.  6. Lantus 12 units subcutaneous nightly.  7. Sliding-scale insulin.  8. Toradol 50 mg q.8 hours.  9. Reglan 10 mg p.o. q.a.c.  and nightly.  10.Metoprolol 12.5 mg p.o. daily.  11.Zyprexa 2.5 mg p.o. nightly.  12.Protonix 40 mg p.o. b.i.d.  13.Renagel 800 mg p.o. t.i.d.  14.Nephro-Vite 1 tablet p.o. daily.  15.Zocor 40 mg p.o. nightly.  16.Flexeril 10 mg p.o. b.i.d.  17.A variety of p.r.n. medications.   HOSPITAL COURSE UP TO DATE:  1. Her coag-negative staph bacteremia, she has been placed on IV      vancomycin, 4/4 blood cultures have been positive for this.  It is      also sensitive to a variety of p.o. medications.  ID has      recommended 6 weeks of IV vancomycin.  A 2D echo is currently      pending to make sure she does not have endocarditis.  A set of      blood cultures was drawn on October 18 which is currently negative.      After hemodialysis session today her PermCath will need to be      exchanged as this is likely the source of her bacteremia.  2. For her diabetes we have been increasing her Lantus and this will      need to be adjusted throughout her  hospitalization.  3. For her end-stage renal disease, hemodialysis as per Renal.   Rest of the chronic medical issues have not been active thus far.      Peggye Pitt, M.D.  Electronically Signed     EH/MEDQ  D:  12/13/2007  T:  12/13/2007  Job:  [id number]

## 2010-07-08 NOTE — Discharge Summary (Signed)
[name], [name] NO.:  [id number]   MEDICAL RECORD NO.:  [id number]          PATIENT TYPE:  INP   LOCATION:  6704                         FACILITY:  [facility]   PHYSICIAN:  Marcellus Scott, MD     DATE OF BIRTH:  Sep 25, 1950   DATE OF ADMISSION:  01/31/2007  DATE OF DISCHARGE:                               DISCHARGE SUMMARY   ADDENDUM:  This is an addendum to the discharge summary done by  Isidor Holts, M.D. on February 07, 2007.  This will outline the  inpatient care from February 08, 2007 to today.   ANTICIPATED DATE OF DISCHARGE:  The day/date of discharge is to be  determined.   PRIMARY CARE PHYSICIAN:  Primary medical doctor is Maxwell Caul,  M.D. of [facility].   DISCHARGE DIAGNOSES:  1. Stage 4 methicillin resistant Staphylococcus aureus infected sacral      decubitus ulcer.  2. Infected left lower extremity stump wound.  3. End-stage renal disease on hemodialysis.  4. Chronic anemia.  5. Secondary hyperparathyroidism.  6. Depression.  7. Diabetes.  8. Coronary artery disease.  9. Asthma.  10.Diarrhea.   DISCHARGE MEDICATIONS:  The discharge medications are to be dictated at  the time of discharge.   OPERATION/PROCEDURE:  Left forearm arteriovenous graft placed February 08, 2007 under ultrasound guidance.   LABORATORY DATA:  Pertinent laboratories are to be dictated at the time  of discharge.  Hemoglobin A-1-C 5.9.   CONSULTATIONS:  1. Renal, Dyke Maes, M.D. and Mindi Slicker. Lowell Guitar, M.D.  2. Vascular Surgery, [name]. [name], M.D.  3. Interventional Radiology, Art A. Hoss, M.D.  4. Wound care.  5. Physical therapy and occupational therapy.  6. Nutrition.   HOSPITAL COURSE:  This is the hospital course from February 08, 2007 to  the present time.   1. Since February 08, 2007 the patient has had a vascular access      placed for dialysis and has been receiving scheduled dialysis      treatments according  to renal team.  She was briefly hyperkalemic      on February 08, 2007, which promptly resolved after dialysis.   1. The patient has a large sacral decubitus ulcer, which prevents her      from sitting for four hours in the dialysis chair, which is a      limiting factor in discharging the patient to the nursing home      according to the renal team.  Transfer to a long-term acute care      facility was recommended by the renal team, which was pursued by      the social worker; however, the patient's insurance does not cover      long-term acute care and will only cover a reclining chair for the      patient; so, at this time placement is an issue.  Probably with her      continued  treatment here  and healing of the sacral wound she may      be able to return to  a skilled nursing facility and be able to sit      in the chair for four hours for dialysis.   1. For the sacral decubitus the wound care team continues to do pulse      lavage and the patient has been continued on antibiotics.  There      has been progressive improvement in her wound with 90% of the wound      looking beefy red and with a little amount of oozing or slough on      the edges.  She has completed two weeks of vancomycin and Zosyn,      which will be discontinued and the wound will be followed.   1. Left lower extremity stump wound.  Her staples were removed and the      patient was continued on antibiotics and the stump is doing fairly      well.   1. The patient's diabetes was fluctuating following which her meds      were adjusted with good control of her diabetes at this time.   1. The patient's COPD/asthma has been stable.   1. Anemia: The patient was transfused packed red blood cells with      improvement in her hemoglobin and hematocrit.  We will continued to      monitor these.   1. The patient currently has had about four episodes of diarrhea of      soft pasty stools overnight.  The patient is  on  Colace, which will      be discontinued and obviously the stool will be tested for C      difficile.  Her diarrhea seems to have improved with Lomotil.   1. The patient's leukocytosis has resolved.  It is less likely to be C      difficile, but should be ruled out in any event.      Marcellus Scott, MD  Electronically Signed     AH/MEDQ  D:  02/13/2007  T:  02/13/2007  Job:  [id number]   cc:   Maxwell Caul, M.D.  Dyke Maes, M.D.  Mindi Slicker. Lowell Guitar, M.D.  [name]. [name], M.D.  Art A. Hoss, M.D.

## 2010-07-08 NOTE — Discharge Summary (Signed)
NAMESHADAWN, HANAWAY NO.:  [id number]   MEDICAL RECORD NO.:  [id number]          PATIENT TYPE:  INP   LOCATION:  4709                         FACILITY:  [facility]   PHYSICIAN:  Isidor Holts, M.D.  DATE OF BIRTH:  1950/12/09   DATE OF ADMISSION:  01/31/2007  DATE OF DISCHARGE:                               DISCHARGE SUMMARY   PMD:  Dr. Baltazar Najjar, [facility].   DISCHARGE DIAGNOSES:  1. Infected sacral decubitus/infected left above-knee amputation      wound, secondary to methicillin-resistant Staphylococcus aureus.  2. End-stage renal disease on hemodialysis.  3. Type 2 diabetes mellitus.  4. Anemia of chronic disease, requiring transfusion of 2 units packed      red blood cells.  5. Secondary hyperparathyroidism.  6. Depression/psychosis.  7. Coronary artery disease/ischemic cardiomyopathy, ejection fraction      20%-25%.  8. History of pulmonary hypertension.  9. Cerebrovascular disease.  10.Bronchial asthma.  11.Gastroesophageal reflux disease.  12.Gastroparesis.  13.Obstructive sleep apnea syndrome, on CPAP.   DISCHARGE MEDICATIONS:  This will be listed in addendum at the  appropriate time, by discharging MD.   PROCEDURES:  Chest x-ray dated 02/03/2007 which showed marked  cardiomegaly without acute disease.   CONSULTATIONS:  1. Dr. Casimiro Needle, nephrologist.  2. Dr. Durwin Nora, vascular surgery.   ADMISSION HISTORY:  As per H&P notes of 01/31/2007 dictated by Dr.  Michaelyn Barter.  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  12/20/2006, cerebrovascular disease status post previous CVA, end-stage  renal disease on chronic hemodialysis, secondary hyperparathyroidism,  type 2 diabetes mellitus non-insulin dependent, depression, psychosis,  anemia of chronic disease, coronary artery disease status post prior MI,  ischemic cardiomyopathy, ejection fraction  08/2006 of 20-25%,  COPD/bronchial asthma, pulmonary hypertension, GERD, diabetic  gastroparesis, obstructive sleep apnea syndrome on home CPAP, who  presents with a 2-week history of a progressively increasing ulcer on  her sacral region as well as a small area of dehiscence in the area of  postsurgical left AKA wound, with multiple staples in situ.  She was  admitted for further evaluation, investigation and management.   CLINICAL COURSE:  1. MRSA infection of unstageable sacral decubitus and left above-knee      amputation stump.  For details of presentation, refer to admission      history above.  The patient on physical examination was found to      have a 4 x 4 cm of unstageable sacral wound with exudate and      offensive odor.  She also had a small area of dehiscence in the      above knee amputation wound, although multiple staples were in      situ.  She was empirically covered with a combination of Vancomycin      and Zosyn.  Wound management care team consultation was called and      wound swabs taken.  Wound swabs subsequently returned positive for      methicillin resistant Staphylococcus aureus as a  dominant flora,      although there were also some gram positive rods and gram-negative      rods.  Wound care team instituted local care, but recommended      surgical consult for possible further debridement of sacral wound      as well as for management of left lower extremity stump.  Surgical      consultation was provided by Dr. Waverly Ferrari.  Some staples      were removed initially to allow adequate drainage of wound and the      patient was closely followed by vascular surgeons.  It is likely      that remaining staples may be removed on 02/07/2007 or 02/08/2007.      Clinically, although the patient spiked pyrexia a few times, she      has progressed satisfactorily.  White cell count which was 15.1 on      02/02/2007 had by 02/07/2007 dropped down to 10.3.  Pain  was      adequately controlled with a combination of scheduled oral      analgesics and p.r.n. intravenous opiate analgesics.   1. End-stage renal disease.  Nephrology consultation was called for      this, and the patient underwent scheduled hemodialysis during the      course of her hospitalization.   1. Type 2 diabetes mellitus.  This was controlled with a combination      of carbohydrate modified diet and sliding-scale insulin coverage,      and although there was some brittleness, as of 02/07/2007  the      patient had become euglycemic.  Further adjustments in insulin      regimen may prove necessary.   1. Coronary artery disease.  The patient remained asymptomatic from      this viewpoint.   1. Bronchial asthma/COPD.  There were no symptoms referable to this,      during the course of the patient's hospitalization.  She continues      on p.r.n. bronchodilator nebulizers.   1. GERD.  The patient was managed with proton pump inhibitor.   1. Gastroparesis.  The patient was continued on Reglan during the      course of her hospitalization.  There were no problems referable to      this.   1. Obstructive sleep apnea syndrome.  The patient remained stable on      nocturnal CPAP.   1. Depression/psychosis.  The patient's mood remained stable on pre-      admission psychotropic medications.   1. Anemia of chronic disease.  Although the patient was managed with      Aranesp as well as iron dextran complex infusion, the patient's      hemoglobin trended downwards and on 02/07/2007 hemoglobin was 8.0      with a hematocrit of 25.6.  She was, therefore, transfused with 2      units of PRBC's.   DISPOSITION:  This will be elucidated in detail at the appropriate time,  by discharging MD.  Post transfusion hemoglobin will be followed.  Local  wound care and broad-spectrum antibiotic therapy will be continued until  the patient is sufficiently clinically recovered to consider   discontinuation of these antibiotics.  Vascular surgical recommendations  will be useful in this context.      Isidor Holts, M.D.  Electronically Signed     CO/MEDQ  D:  02/07/2007  T:  02/08/2007  Job:  [id number]   cc:   Maxwell Caul, M.D.

## 2010-07-08 NOTE — Procedures (Signed)
CEPHALIC VEIN MAPPING   INDICATION:  Dialysis graft placement.  Failed left graft.   HISTORY:  ESRD.   EXAM:  The right cephalic vein is compressible.   Diameter measurements range from 12 to 41 cm.   The left cephalic vein is not evaluated.   Diameter measurements range from   See attached worksheet for all measurements.   IMPRESSION:  Patent right cephalic vein which is of acceptable diameter  for use as a dialysis access site from the level of the mid forearm to  the level of the proximal arm.  Some intimal thickening is noted  throughout the vein.   ___________________________________________  Larina Earthly, M.D.   PB/MEDQ  D:  04/15/2007  T:  04/17/2007  Job:  [id number]

## 2010-07-08 NOTE — Discharge Summary (Signed)
[name], [name] NO.:  [id number]   MEDICAL RECORD NO.:  [id number]          PATIENT TYPE:  INP   LOCATION:  6743                         FACILITY:  [facility]   PHYSICIAN:  Altha Harm, MDDATE OF BIRTH:  [birth year]   DATE OF ADMISSION:  01/08/2008  DATE OF DISCHARGE:  01/10/2008                               DISCHARGE SUMMARY   DISCHARGE DISPOSITION:  [facility].   DISCHARGE DIAGNOSES:  1. Unable to care for self at home.  2. History of arrhythmia, ventricular tachycardia and ventricular      fibrillation arrest.  3. Ischemic cardiomyopathy with ejection fraction of 20%.  4. Coronary artery disease with occluded right coronary artery and      left circumflex coronary artery with collateralization in place.  5. Peripheral vascular disease.  6. Morbid obesity.  7. End-stage renal disease, hemodialysis dependent on Tuesday,      Thursday and Saturday.  8. Diabetes type 2.  9. History of chronic obstructive pulmonary disease .  10.Obstructive sleep apnea.  11.Obesity hypoventilation syndrome.  12.Chronic back pain.  13.Peripheral neuropathy.  14.Gastroesophageal reflux disease.  15.History of depression.  16.Chronic hypoxia, O2-dependent on 2 L of oxygen.  17.Coagulase-negative staph bacteremia with L4-L5 diskitis.  18.Secondary hypoparathyroidism.  19.History of hypertension.  20.Prior history of pancreatitis.  21.History of sacral decubitus ulcers.  22.Peripheral vascular disease status post left above-knee amputation      and a right digital amputation.   DISCHARGE MEDICATIONS:  Include the following:  1. Vancomycin 1 g IV on Tuesday, Thursday and Saturday with      hemodialysis.  The antibiotics were started on December 10, 2007,      and are to be continued for a total of 6 weeks course of vancomycin      from the date of October 17.  Vancomycin levels have to be checked      periodically and dosing adjustments to be made per  pharmacy.  2. Zyprexa 2.5 mg p.o. nightly.  3. Bupropion 150 mg p.o. daily.  4. Simvastatin 40 mg p.o. nightly.  5. Gabapentin 100 mg p.o. three times a day.  6. Nephro-Vite 1 tablet p.o. daily.  7. Omeprazole 20 mg p.o. daily.  8. Reglan 10 mg p.o. twice a day.  9. Metoprolol XL 12.5 mg p.o. nightly.  10.Lantus 60 units subcutaneously nightly.  11.Renagel 1600 mg p.o. three times a day.  12.Nepro with Carb vanilla liquid 237 mL p.o. twice a day.  13.Venofer100 mg p.o. IV on Tuesday with hemodialysis.  14.Percocet 5/325 mg p.o. 1-2 tabs p.o. every 4 hours p.r.n.  15.Aranesp 60 mcg IV on Tuesday with hemodialysis.  16.Hexadrol 2 mcg IV on Tuesday with dialysis.  17.Dulcolax 10 mg p.o. p.r.n.  18.Oxygen 2 L via nasal cannula to be worn around the clock.   CONSULTANTS:  Nephrology for hemodialysis.   PROCEDURES:  None.   DIAGNOSTIC STUDIES:  None.   Code status full code.   PRIMARY CARE PHYSICIAN:  Dr. Maryelizabeth Rowan.   CHIEF COMPLAINT:  Chronic back pain, weakness and inability to the  care  of herself at home.   HISTORY OF PRESENT ILLNESS:  Please refer to the H and P by Dr. Arthor Captain  for details of the HPI.   HOSPITAL COURSE:  The patient was essentially admitted to the hospital  for placement to the nursing home as she was unable to take care herself  at home as was her family.  The patient could not be discharged from the  emergency room to safe environment.  The patient was admitted, continued  on her usual medications and her hemodialysis.  Social work was  consulted and they started the possible placement to a nursing home.  The patient is being placed at [facility].  The patient  has had no acute issues while hospitalized.  She has been continued on  her chronic medications and she is to continue on her dialysis on  Tuesday, Thursday and Saturday.   DIETARY RESTRICTIONS:  The patient should be on a renal diabetic diet.   PHYSICAL RESTRICTIONS:  The  patient is essentially wheelchair and bed  bound.  She would probably benefit from an evaluation by physical  therapy to assess her ability for wheelchair skills.      Altha Harm, MD  Electronically Signed     MAM/MEDQ  D:  01/10/2008  T:  01/10/2008  Job:  [id number]   cc:   Maryelizabeth Rowan, M.D.

## 2010-07-08 NOTE — Op Note (Signed)
NAMEADILYN, HUMES                 ACCOUNT NO.:  [id number]   MEDICAL RECORD NO.:  [id number]          PATIENT TYPE:  AMB   LOCATION:  SDS                          FACILITY:  [facility]   PHYSICIAN:  [name]. [name], M.D.DATE OF BIRTH:  [birth year]   DATE OF PROCEDURE:  12/16/2007  DATE OF DISCHARGE:                               OPERATIVE REPORT   PREOPERATIVE DIAGNOSIS:  End-stage renal disease.   POSTOPERATIVE DIAGNOSIS:  End-stage renal disease.   PROCEDURE:  Ultrasound-guided left IJ Diatek (32 cm).   SURGEON:  [name]. [name], MD.   ANESTHESIA:  Local with sedation technique.   The patient was taken to the operating room and sedated by anesthesia.  The neck and upper chest were prepped and draped in usual sterile  fashion.  This patient had just had a right-sided catheter removed for  infection.  I, therefore selected a left-sided approach.  After the skin  was anesthetized and under ultrasound guidance, the left IJ was  cannulated and a guidewire introduced into the right atrium under  fluoroscopic control.  The tract over the wire was dilated and then the  dilator and peel-away sheath were advanced over the wire and the dilator  was removed.  The 32-cm catheter was threaded over the wire through the  peel-away sheath down into the right atrium and the wire and peel-away  sheath were removed.  The exit site for the catheter was selected and  the skin anesthetized between the two areas.  The catheter was then  brought through the tunnel, cut to the appropriate length, and the  distal ports were attached.  Both ports withdrew easily.  We then  flushed with heparinized saline and filled with concentrated heparin.  The catheter was secured at its exit site with a 3-0 nylon suture.  The  IJ cannulation site was closed with a 4-0 subcuticular stitch.  A  sterile dressing was applied.  The patient tolerated the procedure well  and was transferred to the recovery room in  satisfactory condition.  All  needle and sponge counts were correct.      [name]. [name], M.D.  Electronically Signed     CSD/MEDQ  D:  12/16/2007  T:  12/16/2007  Job:  [id number]

## 2010-07-08 NOTE — Group Therapy Note (Signed)
PURPOSE OF EVALUATION:  Evaluate and treat for left above-knee  amputation for possible prosthesis.   PRIMARY CARE PHYSICIAN:  Maryelizabeth Rowan, M.D./Mike Jennette Kettle at [place].   HISTORY OF PRESENT ILLNESS:  Ms. [name] is a 60 year old African American  female with history of a prior left above-knee amputation in October  2008 by Dr. Durwin Nora for an infected toe that did not heal.  She  subsequently had the surgery, and then was transferred to [facility] for approximately 6 months' time and then returned to home  approximately in February 2009.  She lives in a single-level home with a  ramp in place with an aide approximately 15 hours per week.  She also  lives in the home with her mother, sister, and brother.   The patient's has been fairly inactive since the time of her surgery.  She gets out of bed 3 times a week for her hemodialysis but only  possibly another 2 times during the week that she gets out of bed.  They  are using a Hoyer lift for all transfers at the present time.  She has  not done any substantial standing whatsoever on the right lower  extremity.  She did have a recent right second toe amputation  approximately 2-3 weeks ago.   The patient does have a history of prior stroke and myocardial  infarction in 1998.  She is also on hemodialysis for 7 years' duration.   The patient also reports arthritis of her right leg, which she reports  is more problematic over the past several months.   ALLERGIES:  CAPTOPRIL AND ECOTRIN.   MEDICATIONS:  1. Calcium 667 mg 3 tablets t.i.d.  2. Omeprazole 20 mg daily.  3. Renal vitamin daily.  4. Sensipar 30 mg.  5. Reglan 10 mg q.i.d.  6. Metoprolol 50 mg one-half tablet nightly.  7. Simvastatin 40 mg nightly.  8. Neurontin 100 mg t.i.d.  9. Zyprexa 2.5 mg nightly.  10.BuSpar 150 mg XL daily.  11.Vicodin 1-2 tablets q.4 h. p.r.n. for pain.   REVIEW OF SYSTEMS:  Positive for high blood sugar and shortness of  breath.   PHYSICAL EXAMINATION:  A well-appearing large adult female seated in a  manual wheelchair.  Vitals were not obtained in the office today.  She  has a Product/process development scientist present underneath her buttocks bilaterally.  The patient has 4+/5 strength throughout the bilateral upper  extremities.  Bulk and tone were normal.  Reflexes were 2+ and  symmetric.  Lower extremity exam showed hip flexion at 4+/5 with knee extension of 4  minus/5 with complaints of pain at the right knee.  Hip flexion on the  left side was 4 minus/5.  She has a well-healed above-knee amputation  site present on the left lower extremity.  She has a healing second toe  amputation on the right foot.   We did attempt to stand her, and it required 3-people min-to-mod assist  to stand for 15 seconds using a standard walker with support to maintain  knee extension.   IMPRESSION:  1. 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  2. History of prior stroke and myocardial infarction.  3. End-stage renal disease on hemodialysis x7 years.  4. Arthritis of the right lower extremity.   At the present time, it is our opinion in the [facility] that the  patient is not a candidate for an above-knee prosthesis at this time.  She was reviewed by Manuella Ghazi and Kathlene November  Jennette Kettle in the office today.  We  all came to the same conclusion that at present due to the lack of  mobility and the inability to substantially use her right lower  extremity, she would not be a successful candidate for prosthesis for an  above-knee amputation on the left side.  If she can gain the ability to  stand for a prolonged period of time on the right lower extremity and  possibly take a step or 2 with a walker, then she may be a candidate for  a prosthesis on the left side, but that is likely given her present  functional level.  She also has a history of prior stroke and myocardial  infarction and further significant physical exertion may be problematic   for this patient.   At this point, the patient is not a candidate for an above-knee  prosthesis on the left side.  We will plan on seeing her in followup on  an as-needed basis.           ______________________________  Ellwood Dense, M.D.     DC/MedQ  D:  07/27/2007 10:44:04  T:  07/28/2007 05:36:00  Job #:  [id number]

## 2010-07-08 NOTE — Op Note (Signed)
[name], [name]                 ACCOUNT NO.:  [id number]   MEDICAL RECORD NO.:  [id number]          PATIENT TYPE:  AMB   LOCATION:  SDS                          FACILITY:  [facility]   PHYSICIAN:  [name]. [name], M.D.DATE OF BIRTH:  [birth year]   DATE OF PROCEDURE:  03/31/2007  DATE OF DISCHARGE:  03/31/2007                               OPERATIVE REPORT   PREOPERATIVE DIAGNOSIS:  Chronic renal failure.   POSTOPERATIVE DIAGNOSIS:  Chronic renal failure.   PROCEDURE:  Ultrasound-guided placement of right internal jugular Diatek  catheter.   SURGEON:  [name]. [name], M.D.   ANESTHESIA:  Local with sedation.   TECHNIQUE:  The patient was taken to the operating room and sedated by  anesthesia.  The ultrasound scanner was used to mark both internal  jugular veins, both of which appeared to be patent.  The neck and upper  chest were then prepped and draped in the usual sterile fashion.  After  the skin was anesthetized with 1% lidocaine the right IJ was cannulated  and a guidewire introduced into the superior vena cava under  fluoroscopic control.  Tract over the wire was dilated and then a  dilator and peel-away sheath were passed over the wire and the wire and  dilator removed.  The catheter was passed through the peel-away sheath  and positioned in the right atrium.  The exit site for the catheter was  selected and the skin anesthetized between the two areas.  The catheter  was then brought through the tunnel, cut to the appropriate length and  the distal ports were attached.  Both ports withdrew easily.  We then  flushed with heparinized saline filled with concentrated heparin.  The  catheter was secured at its exit site with a 3-0 nylon suture.  The IJ  cannulation site was closed with 4-0 subcuticular stitch.  A sterile  dressing was applied.  The patient tolerated the procedure well and was  transferred to recovery room in satisfactory condition.  All needle and  sponge counts were correct.      [name]. [name], M.D.  Electronically Signed     CSD/MEDQ  D:  03/31/2007  T:  04/01/2007  Job:  [id number]

## 2010-07-08 NOTE — Consult Note (Signed)
NAMEELSE, HABERMANN NO.:  [id number]   MEDICAL RECORD NO.:  [id number]          PATIENT TYPE:  INP   LOCATION:  2921                         FACILITY:  [facility]   PHYSICIAN:  Melvyn Novas, M.D.  DATE OF BIRTH:  [birth year]   DATE OF CONSULTATION:  12/21/2006  DATE OF DISCHARGE:                                 CONSULTATION   This patient was admitted on December 14, 2006, after becoming  unresponsive was in her outpatient hemodialysis treatment.  She is now transferred on December 21, 2006, to the 2900 floor after a  similar event occurred in todays hemodialysis treatment.   The patient had been hospitalized with mental status changes and  hypotension and also for a nonhealing gangrene of the left foot. The  patient underwent yesterday, on October 27, an above-knee amputation of  her left leg.  She has had a spell during hemodialysis today where her  blood pressure became very low, and the patient became unresponsive even  to noxious stimuli according to the RN note.  This was an acute on chronic mental status change.  The patient's RNs  have been this mental status change before and have verbally related  that this occurred in the past with hypertension.  The patient's sister  also remembered that it was difficult in the past to get Mrs. Latella's  blood pressure elevated.  She also has been evaluated today for low blood sugars, but her  capillary blood sugar level was 202.   REVIEW OF SYSTEMS:  The patient herself cannot mention any Review of  Systems.  It Is noted in the chart, however, that she had complained  about hypotension, feeling dizzy, and that there was some pain in her  left knee and below prior to the amputation   In August of this year, the patient was admitted through the ER as well.  At that time a CT of the head was obtained showing chronic and advanced  white matter changes including encephalomalacia from a remote ischemic  stroke.  There has  been no imaging study of the cranial structures  available for this admission.   The patient's blood pressure during dialysis was recorded at 29/26, and  the patient did only partially recover since being admitted to the  floor. She does not appear to be nauseated or in pain at we examine her.   PAST MEDICAL HISTORY:  1. 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      The sister states that she believes that her sister actually      started hemodialysis in 2001 which is, of course, of minor      importance.  2. The patient has insulin dependent diabetes associated with      gastropathy, retinopathy, and neuropathy.  3. Coronary artery disease with myocardial infarction.  4. Ischemic cardiomyopathy with an ejection fraction of 25%.  5. Hypertension in the history.  6. Chronic disease anemia.  7. Hyperparathyroidism.  8. COPD,  9. Pulmonary hypertension.  10.Asthma history.  11.Also treated for obstructive sleep apnea but refused to wear CPAP.  12.Depression and anxiety noted as chronic psychiatric conditions.  13.Gastroesophageal reflux/  14.Gastroparesis.  15.History of a stroke in 1998.  16.Right foot gangrene.  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  18.History of above-knee amputation yesterday.   CURRENT INDICATIONS:  1. Phenergan.  2. Insulin.  3. Claritin.  4. Colace.  5. Zyprexa.  6. Celexa.  7. Protonix.  8. Lanoxin 0.125 mg Tuesday, Thursday and Saturday.  9. Coumadin 4 mg a day.  10.Metoprolol 12.5 grams b.i.d.  11.Albuterol inhaler.  12.Lorazepam p.r.n.  13.Reglan.  14.Zocor 40 mg daily.   SOCIAL HISTORY:  The patient is residing at [facility] since  discharge from the last admission at [facility] in September 2008.  She  has a living sister, Corrie Dandy. The patient is permanently disabled.  There  was neither alcohol nor tobacco use endorsed in her Past  Medical  History.   PHYSICAL EXAMINATION:  VITAL SIGNS:  Shows the patient to have had a T-  max for today of 101.7 degrees Fahrenheit, a heart rate of 90-95,  98%  oxygenation, and a blood pressure that is now recovered 200/43.  GENERAL:  The patient has a very large neck circumference. She is obese.  ABDOMEN:  She has a soft abdomen.  EXTREMITIES:  Her left leg stump is bandaged and shows no bleed through.  There is no groin pain,  Babinski exam was impossible left and right due  to the partial amputations the patient has undergone. There was no  capillary refill seen on either fingernail, and the patient's capillary  blood glucose level was difficult to establish.   LABORATORY DATA:  Today showed an elevated white blood cell count of  12,000. Borderline hypokalemic. Digoxin level was 0.3 on the day of  admission.  A white blood cell count today was 14.4, hemoglobin 10.8,  hematocrit 33.9, platelet count 254.  The patient's renal function shows  creatinine of 8.72, albumin 2.2, phosphorus 6.9, calcium 9.1. The  patient's troponin level was elevated at 0.75.  CK-MB was 2.6.  Creatinine kinase total was 465. ABG earlier today, showed pCO2 of 50,  pO2 of 90.6, bicarbonate of 27.1 and an acid base excess of 2.1 while  being on oxygen 2 liters of nasal cannula. This was tested on the right  brachial artery.   NEUROLOGIC EXAMINATION:  Mental status.  The patient is arousable.  She  follows motor commands.  She appears appropriate in her affect, does not  appear either scared for to be in pain.  She does not respond verbally  to the motor commands but can put the verbal commands into motor  actions. Cranial nerve examination shows that she has nonreactive pupils  with 4 mm centimeter bilaterally.  She shows a normal blink reflex,  protected visual fields, bilaterally responding to visual threat.  Diabetic retinopathy is known.  There is no facial asymmetry seen. When  we examined her  upper airway, Mallampati is grade 4 or grade D, and a  tongue is midline.  The uvula is not visible.  Motor examination:  The  patient can extend both arms antigravity for over 5 seconds.  She shows  very mild pronator drift.  She provides bilaterally equal grip strength.  There is no focal motor loss over the upper extremities or face.  She  shows also good breath movements and elbow flexion and extension.  Deep  tendon  reflexes were available only in the upper extremities.  Sensory  examination was only limited possible. The patient responds now to touch  and temperature while doing her dialysis treatment and the passing out.  She was not even responding to noxious stimuli.   ASSESSMENT:  I question if the mental status changes are due to sepsis  and due to the hypotension. The patient is febrile.  In addition, this might be accelerated by medication, especially the  pain medication necessary after the above-knee amputation surgery  yesterday.   PLAN:  My plan for the patient is to obtain blood cultures, an EEG, a CT  of the head noncontrast, and a CMET as well as a CBC with differential.   At this time, I cannot see a neurologic deficit and believe that the  transient mental status change is due to the general medical condition  of the patient.      Melvyn Novas, M.D.  Electronically Signed     CD/MEDQ  D:  12/21/2006  T:  12/22/2006  Job:  [id number]   cc:   Garnetta Buddy, M.D.  Fax: [phone]   Terrial Rhodes, M.D.  Fax: [phone]

## 2010-07-08 NOTE — H&P (Signed)
NAMEJEREE, [name]                 ACCOUNT NO.:  [id number]   MEDICAL RECORD NO.:  [id number]          PATIENT TYPE:  INP   LOCATION:  5158                         FACILITY:  [facility]   PHYSICIAN:  Terrial Rhodes, M.D.DATE OF BIRTH:  04/15/1950   DATE OF ADMISSION:  12/14/2006  DATE OF DISCHARGE:                              HISTORY & PHYSICAL   REASON FOR ADMISSION:  Altered mental status, hypotension, gangrenous  the left foot, and dysphasia.   HISTORY OF PRESENT ILLNESS:  [name] is a 60 year old African American  female with a past medical history that is significant for end stage  renal disease (currently on hemodialysis Tuesdays, Thursdays and  Saturdays at [facility]), type 2 diabetes mellitus,  coronary artery disease status post PTA x3, severe anxiety and  depression with multiple admissions over the last 2-3 months (see  previous discharge summaries).  The patient presents to hemodialysis  today with hypotension with systolic blood pressures in the 70's,  complaints of dysphasia with anorexia and weakness.  She has had minimal  p.o. intake to include solids and pills over the past 3 days per the  nursing home.  She experiences persistent nausea and vomiting which is a  chronic issue.  The patient was transported by the nursing home to the  emergency room last night.  Labs were drawn at that time with a white  count of 12.9 with other labs normal.  The patient refused treatment or  workup and was transferred back to the nursing home.  Of note, the  patient is also being [facility] by Dr.  Myra Gianotti for a left ischemic foot in which he has seen the patient today  and recommended a BKA that was scheduled for October 27.  No antibiotics  were given today.  Upon arrival to hemodialysis today, the patient  continued to be hypotensive making ultrafiltration and hemodialysis  difficult.  Mental status was decreased at this time.  Her  left foot was  evaluated by Dr. Arrie Aran and antibiotics were given post treatment  today.  The [facility] for further workup and evaluation of above complaints.   PAST MEDICAL HISTORY:  1. End stage renal disease secondary to diabetic nephropathy on      chronic hemodialysis since November 2002.  2. 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  3. Coronary artery disease with history of myocardial infarction in      the past.  She was followed by Dr. Algie Coffer.  Last 2-D      echocardiogram in July 2008 revealed an ejection fraction of 20-      25%.  The patient was evaluated for implantable cardioverter      defibrillator, but was deemed not a candidate.  4. Hypertension.  5. The anemia.  6. Secondary hyperparathyroidism.  7. Chronic obstructive pulmonary disease, pulmonary hypertension and      asthma.  8. Depression and anxiety ongoing.  9. Gastroesophageal reflux disease.  10.History of cerebrovascular accident in 1998.  11.History of right first gangrene with amputation of the right first      toe secondary to a Methicillin resistant Staphylococcus aureus      infection.  12.History of diabetic gastroparesis.  13.History of obstructive sleep apnea with refusal to wear CPAP.   ALLERGIES:  CODEINE, CAPTOPRIL.   CURRENT MEDICATIONS:  1. Phenergan 12.5 mg q.6h. p.r.n.  2. Insulin 70/30 46 units in the a.m. 32 units in the p.m.  3. Claritin 10 mg daily.  4. Colace 200 mg daily.  5. Zyprexa 5 mg daily.  6. Celexa 20 mg q.h.s.  7. Protonix 40 mg q.h.s.  8. Lanoxin 0.125 mg every Tuesdays, Thursdays and Saturdays.  9. Coumadin 4 mg daily.  10.Metoprolol 12.5 mg b.i.d.  11.Home oxygen.  12.Albuterol inhalers p.r.n.  13.Lorazepam 0.5 mg b.i.d. p.r.n. anxiety.  14.Rena-Vite daily.  15.Reglan 10 mg q.a.c. and h.s.  16.Zocor 40 mg daily.   FAMILY HISTORY:   Noncontributory.   SOCIAL HISTORY:  The patient is currently residing at [facility] since discharge from [facility] November 09, 2006.  Contact  would be her sister, Corrie Dandy.  The patient is permanently disabled.  No  ETOH use.  No tobacco use.   REVIEW OF SYSTEMS:  Limited secondary to altered mental status, however,  denies fevers or chills.  Definite weight loss with current dysphasia.  Complains of weakness, ongoing depression and anxiety.  Complains of  persistent nausea and vomiting.  No diarrhea noted.   PHYSICAL EXAMINATION:  Vitals: Temperature 97.3, pulse 78, respirations  16, blood pressure 105/50.  HEENT: Head is atraumatic, normocephalic.  Bilateral scleral icterus is  noted.  NECK:  Supple.  No lymphadenopathy, no JVD.  CVS: Heart regular rate and rhythm.  S1 and S2.  S4 also noted.  LUNGS:  Decreased breath sounds extreme bases.  Breath sounds are  distant secondary to increased body habitus.  GI: Abdomen is soft and nontender.  No rebound, no guarding.  EXTREMITIES:  The patient noted to have left foot with gangrene with  positive foul odor.  Note:  First and second toe are missing.  Bullus  and drainage is noted anterior aspect of left foot.  NEURO: Alert and oriented, but slow to speak.   LABORATORY DATA:  Labs from December 13, 2006, in the [facility]  Emergency Room:  WBCs 12.9, hematocrit 42.9, digoxin level 0.3,  potassium 3.2, hemoglobin 17.   ASSESSMENT AND PLAN:  As discussed with Dr. Terrial Rhodes who did  see and evaluate this patient.  1. Gangrenous left foot.  The patient will be admitted for pain      control and antibiotics as well as a below knee amputation.  Dr.      Myra Gianotti is the patient's physician and he has been contacted      regarding the patient's admission and need for surgery in the next      few days.  She will be empirically covered with vancomycin and      Zosyn per Pharmacy assistance.  Please note that blood cultures x2       were drawn on December 14, 2006.  Vancomycin 2 gm IV was given today      as well as four tabs 2 gm IV in hemodialysis.  She did continue all      of her dialysis treatment.  2. Dysphasia.  We will perform a swallowing eval this admission and  ask GI to see.  I am unsure if she has had a GI workup in the past;      continue to follow.  3. Type 2 diabetes mellitus.  We will continue with the patient's      insulin and diabetic diet.  We will cover her with sliding scale      insulin as needed.  4. End stage renal disease.  Continue with hemodialysis Tuesday,      Thursdays and Saturdays.  5. History of cerebrovascular accident, appears stable.  6. Asthma and pulmonary hypertension, stable.  Continue with O2.  7. Depression.  Will continue with the medications.  8. Anemia.  Hemoglobin is above goal.  No Epogen is needed at this      time and no iron.      Azucena Fallen, PA    ______________________________  Terrial Rhodes, M.D.    MY/MEDQ  D:  12/14/2006  T:  12/15/2006  Job:  [id number]   cc:   [facility]  [facility]

## 2010-07-08 NOTE — H&P (Signed)
[name], [name]                 ACCOUNT NO.:  [id number]   MEDICAL RECORD NO.:  [id number]          PATIENT TYPE:  INP   LOCATION:  2023                         FACILITY:  [facility]   PHYSICIAN:  Charlestine Massed, MDDATE OF BIRTH:  1950-03-26   DATE OF ADMISSION:  03/30/2008  DATE OF DISCHARGE:                              HISTORY & PHYSICAL   CHIEF COMPLAINT:  Sent from nursing home for intractable vomiting and  back pain.   HISTORY OF PRESENT ILLNESS:  Ms. [name] is a 60 year old female  with ESRD and multiple comorbidities, who has had prior episode of  cardiopulmonary arrest secondary to ventricular fibrillation and V tach.  Was resuscitated during the last admission and was discharged back to  the nursing home.  She was sent from the nursing home because she has  been having intractable vomiting, which is worsening over the past 2  days.  She is also having pain in the back, which is there since the  last discharge with worseniing for the past 2 weeks, the pain has  increased.  She cannot lie flat or move her torso without having a  severe shooting pain, a pain with exacerbation.  The patient states that  she is not able to hold anything down for the past 24+ hours.  Even  without taking anything, she keeps on throwing up, and if she takes  anything, even clear liquids, she throws up instantaneously.  She has  been awake, so most of the time she was throwing up.  She has definite  nausea most of the time before throwing up, so it does not look like  projectile vomiting.   Patient is known to have ESRD, and she has hemodialysis Tuesday,  Thursday, and Saturday via Tesio catheter placed in the left subclavian  vein.  She has had dialysis yesterday, Thursday, and she is regularly  having dialysis.  She states that she sticks to her renal diet.   Patient denies any chest pain.  No shortness of breath.  No diarrhea.  No headache.  No fever.  No chills.  No cough.  No  sputum production.  No loss of consciousness.  No palpitations.  No fall.  Patient states  that whenever she feels like throwing up, she gets ready, so she has not  had a severe cough following a bout of vomiting.  She did not notice any  blood in her vomiting at any time.  Her stools are brown in color.  Her  last bowel movement was yesterday.  Today, she is passing gas  occasionally.  She states that she does not know feel bloated.  She has  no other complaints.   PAST MEDICAL HISTORY:  1. Prior history of cardiopulmonary arrest with ventricular      tachycardia and ventricular fibrillation.  2. Ischemic cardiomyopathy with an ejection fraction of 20%.  3. Coronary artery disease with total occlusion of RCA and LCX with      collaterals in place and patient found to be not a candidate for      revascularization.  4. Peripheral  vascular disease with marked peripheral arterial      calcifications.  5. End-stage renal disease with hemodialysis on Tuesday, Thursday, and      Saturday.  6. Diabetes mellitus type 2 with diabetic neuropathy.  7. Peripheral vascular disease, status post left BKA.  8. Chronic obstructive pulmonary disease.  9. Obstructive sleep apnea with obesity hypoventilation syndrome.  10.Diskitis of L4-5 vertebra at the time of last admission.  11.History of coagulase-negative Staphylococcus infection in the blood      with bacteremia, positive in 4 sets of blood cultures.  12.Chronic hypoxia with O2 dependence.  13.Secondary hypoparathyroidism.  14.History of pancreatitis.  15.Prior history of sacral decubitus ulcers.   CURRENT MEDICATIONS:  1. Lantus 60 units subcutaneously daily.  2. Nephro-Vite 1 tablet daily.  3. OxyContin p.o. daily.  4. Percocet p.o. daily.  5. Prilosec p.o. daily.  6. Reglan 5 mg p.o. q.6h. p.r.n.  7. Renagel p.o. daily.  8. Colace p.o. daily.  9. Zyprexa 2.5 mg p.o. nightly.  10.Wellbutrin XL 150 mg p.o. daily.  11.Zocor, dose not  known, p.o. daily.  12.Toprol XL 25 mg p.o. daily.  13.Oxygen 24 hours a day.   ALLERGIES:  ASPIRIN, CAPTOPRIL, CARDENE, IV CONTRAST DYE.   FAMILY HISTORY:  Positive for diabetes and hypertension.   SOCIAL HISTORY:  Denies recreational drugs, smoking, or alcohol.  Lives  in a nursing home.  She does not have any children.   REVIEW OF SYSTEMS:  CONSTITUTIONAL:  Extreme fatigue and generalized  weakness.  HEAD/NECK:  No neck rigidity.  No visual issues.  CHEST:  No  cough.  No sputum production.  No phlegm.  CARDIAC:  No chest pain, no  palpitations, no shortness of breath.  GI/ABDOMEN:  Recurrent  intractable nausea with vomiting.  Unable to hold any food down.  GU/URINARY TRACT:  End-stage renal disease, on dialysis.  No dysuria.  No hematuria.  GYN:  No previous bleed.  No spotting.  MUSCULOSKELETAL:  Tenderness in the back with back pain, the extreme shooting pain with  mild tarsal movements.  CNS:  No numbness, no tingling, no weakness in  the lower or upper extremities.  A 12-point review of all systems is  done.  Positive pertinent features are mentioned as well as in the  history of presenting illness and is negative otherwise.   PHYSICAL EXAMINATION:  VITAL SIGNS:  Temperature 99 degrees Fahrenheit,  blood pressure on admission was 197/102.  Currently, it is 155/82.  Heart rate 104 per minute, respirations 20-22 per minute.  O2 sat 96% on  2 liters of oxygen.  GENERAL:  Patient looks extremely fatigued.  She is awake.  She had an  episode of vomiting while I was examining her.  Afebrile.  Not in  obvious distress.  HEAD/NECK:  Pupils reactive to light.  No bleeding seen in oral or nasal  mucosa.  Neck is supple.  No JVD.  No bruits.  CHEST:  In the left chest, patient has Tesio catheter placed.  Bilateral  minimal crackles heard posteriorly on both sides.  CARDIAC:  S1 and S2 heard.  Regular.  No murmur.  ABDOMEN:  Soft, not distended.  Hypotympanic resonance present.   Bowel  sounds are gurgling and hyperactive.  There is no costovertebral angle  tenderness.  No rigidity.  No guarding.  EXTREMITIES:  Left side:  BKA.  Right side:  There is lower extremity  edema present.  No tenderness or edema in the calf.  CNS:  Moves all 4 extremities.  Speech and comprehension are intact.  MUSCULOSKELETAL:  There is tenderness over L4-5 vertebra.  It is  exquisitely tender to touch.  She has not much warmth there.  SKIN:  Currently, there are no sacral ulcerations.  No ulcerations seen in the  right lower extremity.   LABS:  Chest x-ray was done today which reveals no evidence of any  pneumonia, some minimal linear atelectasis, left mid lung zone, and  stable cardiomegaly, mild bronchitic changes.   CT, abdomen and pelvis, shows no evidence of any acute intra-abdominal  findings.  Marked arterial calcifications are seen throughout the  abdomen, inside the liver, and all visceral arteries are calcified and  aortic calcification.   CT pelvis showing end plate sclerosis and irregularity of the L4-5 level  of the lumbar spine without associated fluid collection.  Stable mild  narrowing of the central canal at this level.  No acute bony finding is  seen.  No other pelvic abnormalities are seen.   LABS:  EKG done today shows sinus tachycardia at a rate of 123 beats per  minute.  The P wave is upright, but it is biffed in the lead II but is  upright, so possibly left and right atrial enlargement are present.  There is T inversion that is normal axis.  There is incomplete left  bundle branch block present.  There is T inversion in lead aVL.  There  is borderline left ventricular hypertrophy with Cornell criteria.  The  changes of T waves in the lateral leads possibly represent LV strain  secondary to a left ventricular hypertrophy.   CBC shows WBC of 9.1.  Hemoglobin is 20.1.  Initial value was 16.3.  It  was repeated at 20.1.  Hematocrit, initial value, was 51.2.   Repeat is  59.  Platelet count is 216, neutrophils 87%.  No bands reported.  Sodium  135, potassium 5.3, chloride 108, bicarb 22.3, BUN 53, creatinine 8.1,  glucose 152.  LFTs show total bilirubin of 1.6, alkaline phosphatase  118, AST 49, ALT 21, total protein 8.8, albumin 3.5, calcium 10.  Troponin less than 0.05.  Blood cultures, 2 sets sent.  Ionized calcium  1.02.   ASSESSMENT/PLAN:  1. Intractable vomiting, possibly secondary to acute gastroparesis of      diabetes.  CAT scan has not revealed any evidence of any intestinal      obstruction anywhere.  No fluid levels have been reported in the      CAT scan, possibly secondary to gastroparesis secondary to      diabetes.  Will put the patient on IV Reglan 10 mg q.8h. with      Zofran p.r.n. any other extra vomiting.  Keep the patient n.p.o.      for now.  If vomiting continues, then will put an NG tube with wall      suction.  Gastroenterology evaluation in a.m. to assess the reason      for vomiting, which is most likely gastroparesis.  If patient is      vomiting still, he can start her on erythromycin p.o. for diabetic      gastroparesis.  Full control of her blood sugar is needed for      diabetic gastroparesis.  IV Protonix 40 mg IV daily.  2. Back pain with diskitis, L4-5.  We will start the patient on IV      vancomycin in view of the fact that patient has coag-negative  Staphylococcus in the blood as well as a recent acute flare now and      patient also has Tesio catheter for dialysis.  So will consider      coagulase-negative Staphylococcus as significant in this case due      to the presence of the Tesio catheter.  Will start the patient on      vancomycin protocol.  Family will keep the patient on Zosyn.  ID      consult in a.m. to assess the significance of diskitis in this      patient and if needed, a neurosurgical consult can be considered to      get a biopsy of the tissue to see if there is osteomyelitis  going      on.  If there is osteomyelitis, patient needs a diskectomy of L4-5      vertebrae.  3. Diabetes:  Currently, patient is n.p.o.  Will hold off on the      Lantus insulin but will continue blood sugar control with NovoLog.  4. Hypertension:  Will give hydralazine IV q.6h.  Will continue the      Toprol XL for this patient but will hold off on other medications      due to the fact that the patient cannot swallow anything now.  5. History of depression:  Currently will hold off on Wellbutrin and      Zyprexa, due to the fact that the patient cannot swallow.  6. Back pain:  Will continue Dilaudid p.r.n. back pain.  Will hold off      on p.o. meds due to intractable nausea and vomiting.  7. History of obstructive sleep apnea and chronic hypoxia:  Continue      nasal oxygen by nasal cannula 24 hours a day.  8. Deep venous thrombosis prophylaxis:  Will give heparin 5000 q.8h.  9. Rule out any compression in the cord of extension of infection into      the meningeal space.  Patient does not have any fever right now to      warrant a lumbar puncture at this moment, and there is no neck      rigidity, no other signs of bacterial meningitis.  Patient may have      bacteremia, but clinically, there are no features of meningitis      other than tenderness of the lumbar vertebrae, which has been      reportedly due to diskitis.  Will anyhow continue to monitor and      with ID consult, will ask specifically ID whether there is a need      for lumbar puncture in this case.   A total of 90 minutes was spent on this admission.      Charlestine Massed, MD  Electronically Signed     UT/MEDQ  D:  03/30/2008  T:  03/31/2008  Job:  [id number]

## 2010-07-08 NOTE — Assessment & Plan Note (Signed)
OFFICE VISIT   [name], [name]  DOB:  [birth year]                                       09/18/2008  ZOXWR#:[id number]   I saw the patient in the office today with a chief complaint of burning  pain in her right foot.  This is a pleasant 60 year old woman with  chronic kidney disease on dialysis and diabetes who has had a previous  left above the knee amputation.  She has also had amputation of the  right second toe.  She came in for a routine Doppler study of her right  foot today and she asked to be seen because of this burning pain in her  foot.  Of note, she is not ambulatory with her prosthesis at this point.  She denies any history of rest pain and has had no history of nonhealing  ulcers.  The burning pain is described as severe and has been going on  for several weeks.  She does not remember any injury to her foot.   REVIEW OF SYSTEMS:  On review of systems she has had no fever or chills.   PHYSICAL EXAMINATION:  On physical examination this is a pleasant 60-  year-old woman who appears her stated age.  Blood pressure is 148/78,  heart rate is 90.  Lungs are clear bilaterally to auscultation.  On  cardiac exam she has a regular rate and rhythm.  She has a palpable  femoral pulse and the right foot is warm and well-perfused.  She has  biphasic signals in the posterior tibial and anterior tibial position.  She has calcific vessels and so ABIs could not be obtained.   I explained to the patient that I think her pain is a neuropathy related  to her diabetes.  I do not think it is from her peripheral vascular  disease and she appears to have adequate circulation to the right foot.  She is on gabapentin currently.  According our records she is on 100 mg  t.i.d.  This could potentially be increased if her pain persists;  however, I have explained that I prefer to have this managed by her  primary care physician.  Will plan on seeing her back in 6 months.   She  knows to call sooner if she has problems.   [name]. [name], M.D.  Electronically Signed   CSD/MEDQ  D:  09/18/2008  T:  09/19/2008  Job:  [id number]   cc:   Maryelizabeth Rowan, M.D.  [facility]

## 2010-07-08 NOTE — Consult Note (Signed)
[name], EARWOOD NO.:  [id number]   MEDICAL RECORD NO.:  [id number]          PATIENT TYPE:  INP   LOCATION:  3108                         FACILITY:  [facility]   PHYSICIAN:  Maree Krabbe, M.D.DATE OF BIRTH:  18-Jan-1951   DATE OF CONSULTATION:  05/05/2008  DATE OF DISCHARGE:                                 CONSULTATION   REASON FOR CONSULTATION:  Shortness of breath and management of  hemodialysis.   HISTORY OF PRESENT ILLNESS:  The patient is a 60 year old female with  multiple medical problems including end-stage renal disease, on Tuesday,  Thursday, and Saturday hemodialysis, who was admitted to the hospital on  May 04, 2008, with a complaint of shortness of breath.  The patient  reports that she had a normal hemodialysis session on May 03, 2008,  that lasted 4-1/2 hours, and afterwards at home she became acutely short  of breath.  She presented to the hospital on May 04, 2008, with  persistent shortness of breath.  The patient reports to me that this  does not feel like a volume overload issue or her typical COPD.  She  denies any fevers.  She denies any nausea or vomiting.  She has been  eating normally.  The patient reports that she felt normal until the  night when the symptoms began which was after her dialysis session.  She  denies any chest discomfort at this time.  Of note, the patient has a  history of L4-L5 diskitis that was diagnosed on April 08, 2008.  She  has been on vancomycin with dialysis since that time with the plan to  continue until May 17, 2008.   ALLERGIES:  1. ASPIRIN.  2. CAPTOPRIL.  3. CODEINE.   MEDICATIONS:  1. Lantus 60 units at bedtime.  2. Ambien 5 mg at bedtime p.r.n.  3. Oxycodone 5/325 mg p.r.n. usually takes 1 per day.  4. Nephro-Vite daily.  5. Prilosec 20 mg daily.  6. Renagel 2400 mg t.i.d. with meals.  7. Colace as needed.  8. Wellbutrin XL 150 mg p.o. daily.  9. Lopressor XL 12.5 mg p.o.  daily.  10.Zyprexa 2.5 mg p.o. daily.  11.Vancomycin IV with hemodialysis sessions for diskitis, plan to      continue through May 17, 2008.  12.Reglan 10 mg p.o. t.i.d. with meals.   PAST MEDICAL HISTORY:  1. End-stage renal disease, on Tuesday, Thursday, and Saturday      hemodialysis.  2. Coronary artery disease with a cath in 2009 showing complete      obstruction of RCA and left circumflex arteries.  She was not a      reperfusion candidate.  3. Ischemic cardiomyopathy with an EF of 15-20%.  4. History of cardiac arrest with V-tach and VFib in November 2009.  5. Type 2 diabetes with gastroparesis.  6. History of left above-the-knee amputation in 2009.  7. COPD, O2 dependent.  8. Peripheral vascular disease.  9. History of decubitus ulcers.  10.Depression.  11.Obesity.  12.Cataracts.  13.History of pancreatitis.  14.L4-L5 diskitis, diagnosed in February 2010, with cultures  growing      coag-negative Staph, sensitive to vancomycin.  15.Secondary hyperparathyroidism, on Hectorol.  16.History of CVA.   FAMILY HISTORY:  Noncontributory.   SOCIAL HISTORY:  The patient currently lives with her mother in  [place].  She gets to and from dialysis with medical transportation.  She receives hemodialysis at [facility].   REVIEW OF SYSTEMS:  As per HPI.   PHYSICAL EXAMINATION:  VITAL SIGNS:  Temperature 97.5, heart rate 85,  blood pressure 106/47, respirations 26, and O2 saturations 100% on 3 L  nasal cannula.  GENERAL:  The patient is somewhat dyspneic at rest.  She is alert and  answers questions appropriately.  HEENT:  Moist mucous membranes.  NECK:  JVD is difficult to assess because of the patient's large neck  circumference.  CARDIOVASCULAR:  Regular rate and rhythm.  There are no murmurs.  LUNGS:  The patient is short of breath and somewhat tachypneic at rest.  She has no rales on exam, however.  She has decent air entry throughout  all lung fields.  There is  no prominent wheeze associated.  ABDOMEN:  Obese, soft, no masses palpated.  EXTREMITIES:  There is no lower extremity edema on her right side.  She  has an AKA on her left side.  ACCESS:  She has a left-sided PermCath which appears to be clean, dry,  and intact.  There is no erythema or drainage from the site.   LABORATORY DATA AND STUDIES:  Troponins are 0.02 and 0.03 on successive  readings, A1c is 5.7.  BNP is 551, last reading was greater than 3200 in  February 2010.  CBC shows a hemoglobin of 13.1, white blood cell count  of 7.0, and platelet count 247.  Complete metabolic panel performed the  day prior shows sodium 138, potassium 4.6, chloride 98, CO2 27, BUN 26,  creatinine 5.42, and glucose 106.  LFTs are within normal limits.  Albumin is 2.9 and calcium 9.8.  CT angiogram performed today is  negative for PE.  It does show patchy bilateral opacities.  Chest x-ray  from the day previous shows no definite pneumonia and no obvious  pulmonary edema; however, the study is of very poor quality.   ASSESSMENT AND PLAN:  1. End-stage renal disease.  We will plan hemodialysis today as per      the patient's normal schedule.  She was dialyzed to below her      estimated dry weight at her last dialysis session.  I do not feel      at this point that the patient is volume overloaded.  We will plan      to remove about 2-3 L as blood pressure tolerates.  2. Shortness of breath.  CT angiogram is negative for pulmonary      embolism.  The patient is vasculopathic and certainly this could be      related to a cardiac issue.  Cardiology is following the patient.      She is on heparin drip currently.  She has a history of complete      obstructive lesions of her coronary arteries.  She has not been      deemed a surgical reperfusion candidate up to this point.  The      patient does have extremely low ejection fraction.  BMP is not      terribly high on this presentation and I do not feel at  this point      that  this is an obvious congestive heart failure exacerbation.      Given bilateral patchy opacities on CT angiogram, pneumonia may be      a consideration, but we will defer to the primary team regarding      this issue.  3. Diabetes.  The patient is currently on Lantus 20 units at bedtime.      We will plan to change this to her home regimen especially if blood      sugars run high.  She is also covered by sliding scale at this      time.  4. Ischemic cardiomyopathy.  Last ejection fraction  is 20% in      December 2009.  Cardiology has been consulted and Dr. Sharyn Lull has      seen the patient.  The patient has complete obstructive lesion in      right coronary artery and left circumflex as already described.  5. Coronary artery disease.  The patient is on a beta-blocker.  She is      allergic to ASPIRIN.  She has a history of being on Plavix, but      does not appear to be on this medicine at this time.  6. Secondary hyperparathyroidism; on Hectorol, check renal function      panel.  7. Anemia.  The patient is on Epogen as an outpatient.  Her hemoglobin      is 13 and we will hold this medicine for now.  8. Diskitis.  She is on vancomycin, until May 17, 2008, we will plan      to continue this treatment.  9. Disposition.  Hemodialysis today.  Recommend empiric treatment for      pneumonia at this time.  Blood cultures are currently pending.  We      will defer treatment of pneumonia to her primary team.  Proceed      with dialysis today.       Myrtie Soman, MD  Electronically Signed      Maree Krabbe, M.D.  Electronically Signed    TE/MEDQ  D:  05/05/2008  T:  05/06/2008  Job:  [id number]

## 2010-07-08 NOTE — Consult Note (Signed)
NAMEMARIZA, BOURGET NO.:  [id number]   MEDICAL RECORD NO.:  [id number]          PATIENT TYPE:  INP   LOCATION:  5532                         FACILITY:  [facility]   PHYSICIAN:  Antonietta Breach, M.D.  DATE OF BIRTH:  [birth year]   DATE OF CONSULTATION:  11/04/2006  DATE OF DISCHARGE:                                 CONSULTATION   HISTORY OF PRESENT ILLNESS:  Ms. Justine Dines has improved.  She is  cooperative with care.  She ate 85% of her supper.  She has been logical  and cooperative with physical therapy today and she can recall physical  therapy's discussions with her and directions.  She has normal interests  and mood.   REVIEW OF SYSTEMS:  PSYCHIATRIC:  The patient does not have any  hallucinations or delusions.  She actually did not require the Zyprexa.  She does continue to be shy, which is a factor in how much she  verbalizes.  Her eye contact is good today.  GASTROINTESTINAL:  No  Celexa side effects.  HEMATOLOGIC/LYMPHATIC:  The WBC is 7.7, hemoglobin  11.4, platelet count 268,000.   PHYSICAL EXAMINATION:  VITAL SIGNS:  Temperature 98.4, pulse 83,  respirations 20, blood pressure 155/102, O2 saturation on 2 liters 98%.  Her CBG was 262.  MENTAL STATUS EXAM:  Ms. Maiden has good eye contact.  She is not having  any hallucinations.  She is not having any agitation.  She is oriented  to all spheres.  On memory assessment, the physical therapist and the  patient confirm that she is recalling information from the physical  therapist as well as instruction.  The patient is demonstrating intact  judgment to cooperate with her health care.  Thought process is  coherent.  She does not have any thoughts of harming herself or others.  Affect is pleasant.  Her mood is within normal limits.  She no longer is  confused.   ASSESSMENT:  1. Delirium, not otherwise specified.  293.00, now resolved.  2. 293.00.  Mood disorder, not otherwise specified, depressed  (functional and general medical factors).  Stable.  3. 296.35.  Major depressive disorder, recurrent, in partial      remission.   RECOMMENDATIONS:  1. The patient no longer needs a psychiatric ward.  She is      psychiatrically cleared for discharge to her placement facility.  2. Would continue her Celexa 20 mg daily.  3. Psychiatric followup can be found at one of the clinics attached to      [facility], [place], or [facility].      Antonietta Breach, M.D.  Electronically Signed     JW/MEDQ  D:  11/05/2006  T:  11/05/2006  Job:  [id number]

## 2010-07-08 NOTE — Assessment & Plan Note (Signed)
OFFICE VISIT   [name], [name]  DOB:  [birth year]                                       12/07/2007  ZOXWR#:[id number]   The patient is a 60 year old female with end-stage renal disease who is  referred for placement of a new hemodialysis access.  She previously had  a left forearm AV graft placed.  This was originally placed in October  2002, and underwent multiple revisions and finally was abandoned in  February 2009.  She currently has been dialyzing via right-sided Diatek  catheter since that time.  She has had no prior access procedures on the  right arm.  She was evaluated by Dr. Arbie Cookey in February 2009, and found  to have nonpalpable brachial and radial pulse on the right side.  It was  decided at that point that she should just remain with a dialysis  catheter for long-term access.   She has had multiple revascularizations of her lower extremities.  She  currently has a functioning femoral popliteal bypass graft in the right  leg.  She had a failed femoral popliteal bypass graft in the left leg  and intimately required a left above knee amputation.  She is recovered  from this.   PAST MEDICAL HISTORY:  Otherwise remarkable for diabetes, coronary  artery disease with dilated cardiomyopathy and ejection fraction of 25%.  Hypertension, anemia, hyperparathyroidism, COPD with pulmonary  hypertension, depression/anxiety, GERD, stroke, gastroparesis, sleep  apnea.   PHYSICAL EXAM:  Today blood pressure 137/80 in the left arm.  She has a  thrombosed left forearm AV graft.  She has nonpalpable brachial and  radial pulses in the left upper extremity and the right upper extremity.   I reviewed her most recent central venogram which was from December  2008.  This showed patent central veins on the left side.   The patient has a difficult access problem as far as her arterial inflow  is concerned.  She has nonpalpable brachial and radial pulses  bilaterally.   She may not have enough arterial pressure to support a  graft in either upper extremity.  She would not be a candidate for lower  extremity dialysis graft.  I believe the best option for her would be to  schedule her for bilateral upper extremity arteriograms to determine  what the arterial inflow of both upper extremities is.  I will also do a  left arm central venogram on October 30.  The central venogram will be  to make sure that also she has a patent central venous structures on the  left side with the potential possibility of doing a left upper arm AV  graft.  If the arterial flow is adequate on both sides, we could also  consider whether or not to place an access on her right arm.  She was  informed today of the procedure details, risks, benefits, and possible  complications of bilateral upper extremity arteriography as well as  central venogram.  She understands and agrees to proceed.  This is  scheduled for December 23, 2007.   Janetta Hora. Fields, MD  Electronically Signed   CEF/MEDQ  D:  12/07/2007  T:  12/08/2007  Job:  [id number]   cc:   Terrial Rhodes, M.D.

## 2010-07-08 NOTE — Discharge Summary (Signed)
[name], [name]                 ACCOUNT NO.:  [id number]   MEDICAL RECORD NO.:  [id number]          PATIENT TYPE:  INP   LOCATION:  3108                         FACILITY:  [facility]   PHYSICIAN:  Maree Krabbe, M.D.DATE OF BIRTH:  Oct 15, 1950   DATE OF ADMISSION:  05/04/2008  DATE OF DISCHARGE:  05/06/2008                               DISCHARGE SUMMARY   REASON FOR HOSPITALIZATION:  Shortness of breath and management of  hemodialysis.   DISCHARGE DIAGNOSES:  1. Shortness of breath presumed to be due to chronic obstructive      pulmonary disease exacerbation.  2. End-stage renal disease, on Tuesday, Thursday, and Saturday      hemodialysis.  3. Coronary artery disease with obstruction of right coronary artery      and left circumflex arteries.  The patient is not a reperfusion      candidate.  4. Ischemic cardiomyopathy of 15-20%.  5. History of cardiac arrest.  6. Type 2 diabetes.  7. History of left above-knee amputation in 2009.  8. Peripheral vascular disease.  9. Depression.  10.Obesity.  11.History of pancreatitis.  12.L4-L5 diskitis diagnosed in February 2010 with plan to treat with      intravenous antibiotics until May 17, 2008.  13.Secondary hyperparathyroidism.  14.History of cerebrovascular accident.   DISCHARGE INSTRUCTIONS:  The patient is to follow up at her hemodialysis  center for her regularly scheduled Tuesday, Thursday, and Saturday  hemodialysis.   DISCHARGE MEDICATIONS:  1. Lantus 60 units at bedtime.  2. Ambien 5 mg at bedtime.  3. Oxycodone 5/325 mg p.r.n.  4. Nephro-Vite daily.  5. Prilosec 20 mg daily.  6. Renagel 2400 mg t.i.d. with meals.  7. Colace as needed.  8. Wellbutrin XL 150 mg p.o. daily.  9. Lopressor XL 12.5 mg p.o. daily.  10.Zyprexa 2.5 mg p.o. daily.  11.Vancomycin IV with hemodialysis sessions for diskitis and plan to      continue until May 17, 2008.  12.Reglan 10 mg p.o. t.i.d.   PERTINENT LABORATORY DATA AND  STUDIES:  CT angiogram of the chest  performed on May 06, 2007, showed no acute pulmonary thromboembolism.  Blood cultures x2 performed on this admission showed no growth as of the  time of this dictation.  BNP performed on this admission was 342.  Lipid  profile showed cholesterol 164, triglycerides 29, LDL 111, and HDL 31.  Cardiac enzymes were negative x3 sets.  Hemoglobin A1c showed an A1c of  5.7.   BRIEF HOSPITAL COURSE:  The patient is a 60 year old female with  multiple medical problems including a left above-the-knee amputation,  coronary artery disease with complete obstruction of the left circumflex  artery and RCA, ischemic cardiomyopathy with an EF of 15-20%, who was  admitted with shortness of breath.  This was thought possibly be due to  pulmonary embolism.  CT angiogram of the chest was negative for the  same.  The patient ruled out for cardiac issue by cardiac enzymes and  received her normal hemodialysis session on Saturday while she was in  the hospital.  On the  day after admission, May 06, 2008, the patient  was discharged to home given a negative workup for her shortness of  breath.  The patient is instructed to resume her previously scheduled  hemodialysis sessions and to return for any problems.  At the time of  discharge, the patient's blood pressure and oxygen saturations were  stable.  She is O2 dependent at baseline.  She was discharged home in  fair and stable condition.   DISPOSITION:  The patient was discharged to home.      Myrtie Soman, MD  Electronically Signed      Maree Krabbe, M.D.  Electronically Signed    TE/MEDQ  D:  05/07/2008  T:  05/08/2008  Job:  [id number]

## 2010-07-08 NOTE — Discharge Summary (Signed)
[name], [name]                 ACCOUNT NO.:  [id number]   MEDICAL RECORD NO.:  [id number]          PATIENT TYPE:  INP   LOCATION:  6714                         FACILITY:  [facility]   PHYSICIAN:  Ruthy Dick, MD    DATE OF BIRTH:  Jul 03, 1950   DATE OF ADMISSION:  03/30/2008  DATE OF DISCHARGE:  04/08/2008                               DISCHARGE SUMMARY   REASON FOR ADMISSION:  Intractable vomiting and back pain.   FINAL DISCHARGE DIAGNOSES:  1. Diabetic gastroparesis.  2. Diskitis.  3. End-stage renal disease, dialysis dependent.  4. Chronic anemia.  5. Diabetes mellitus.  6. Hypertension.  7. Coronary artery disease.  8. Peripheral vascular disease.  9. Obesity.  10.Left above knee amputation.   PROCEDURES DONE DURING THIS ADMISSION:  1. CT of the abdomen and pelvis was done and was read as having no      acute abdominal findings.  However, it notes that the endplate      sclerosis and irregularities at the L4 to L5 level my signify      diskitis versus osteomyelitis.  2. MRI of the L-spine was done and was read as worsened changes of      diskitis at L4 to L5 with adjacent osteomyelitis and no evidence of      abscess or gross neural compression.  3. L4 to L5 disk aspiration under fluoroscopy.   BRIEF HISTORY OF PRESENT ILLNESS AND HOSPITAL COURSE:  Ms. [name] is a 60-  year-old African American lady with a past medical history significant  for end-stage renal disease, dialysis dependent, hypertension, diabetes  mellitus, peripheral vascular disease and anemia who was sent from the  nursing home to the hospital because of intractable nausea and vomiting  and worsening back pain.  In the hospital she was treated with Reglan  with good resolution of what appeared to be diabetic gastroparesis, but  the patient had to also be treated for her diskitis.  There was also  mention of this possibly being osteomyelitis.  In any case, she had been  started on vancomycin on the 11th,  and recommendation by the Infectious  Disease specialist is that the patient should have 6 weeks of oral  treatment for this diskitis with vancomycin during dialysis sessions and  possibly suppressive therapy with doxycycline.  There was an aspirate of  the L4 to L5 disk and so far has not grown for over 4 days now.  I put a  call to microbiology to see whether more information today being the  fifth day but I have not had a reply back yet.  On a prior discussion  with the Infectious Disease specialist, Dr. Sampson Goon, he was of the  opinion that if there was to be any growth, we should be able to see  something on the third and fourth day after which the patient may be  able to go and be followed up outpatient.  The patient has done well and  has had all dialysis sessions while here in the hospital, and we feel  she has benefited from this  hospitalization and should be ready to go  back to the nursing home.  Today, she has no complaints whatsoever.  No  chest pain, no shortness of breath, no abdominal pain, no nausea, no  vomiting, no diarrhea, no constipation.   PHYSICAL EXAMINATION:  VITAL SIGNS:  Temperature 98.7, pulse 81,  respirations 18, blood pressure 105/70 and saturating 97% on 2 liters.  CHEST:  Clear to auscultation bilaterally.  ABDOMEN:  Soft, nontender.  EXTREMITIES:  No clubbing, cyanosis, or edema.  Left above knee  amputation noted.  CARDIOVASCULAR:  First and second heart sounds.  CENTRAL NERVOUS SYSTEM:  No obvious deficits.   The patient is to follow up with her primary care physician at the  nursing home as soon as possible and as scheduled.  She is continue  regular dialysis sessions which usually on Tuesday, Thursday and  Saturday.  Follow up with nephrologist and to call for appointment with  Dr. Sampson Goon in about 4 weeks.   DISCHARGE MEDICATIONS:  1. Lantus 60 units subcu nightly.  2. Nephro-Vite 1 tablet daily.  3. Prilosec 20 mg daily.  4. Renagel  now increased to 2400 mg p.o. t.i.d. with meals.  5. Colace 100 mg b.i.d.  6. Wellbutrin XL 150 mg daily.  7. Lopressor 12.5 mg daily.  8. Zyprexa 2.5 mg nightly.  9. Percocet at her home dose.  10.Vancomycin 1 g after hemodialysis on Tuesday, Thursday and      Saturday.  This should be continued until May 17, 2008 for a      total of about 6 weeks.  Vancomycin trough is to be checked      predialysis, with the goal being 15-25.  11.After May 17, 2008, doxycycline 50 mg daily is to be started for      suppressive therapy.  Treatment to be confirmed with Dr.      Sampson Goon.  12.The patient is also to be on Reglan 10 mg p.o. t.i.d. p.r.n. for      nausea.   Time used for discharge planning was greater than 30 minutes.      Ruthy Dick, MD  Electronically Signed     GU/MEDQ  D:  04/08/2008  T:  04/08/2008  Job:  [id number]   cc:   [facility], M.D.  [facility]

## 2010-07-08 NOTE — Discharge Summary (Signed)
NAMEBRISA, AUTH NO.:  [id number]   MEDICAL RECORD NO.:  [id number]          PATIENT TYPE:  INP   LOCATION:  0454                         FACILITY:  [facility]   PHYSICIAN:  Gardiner Barefoot, MD    DATE OF BIRTH:  10-24-1950   DATE OF ADMISSION:  05/25/2008  DATE OF DISCHARGE:  05/31/2008                               DISCHARGE SUMMARY   PRIMARY CARE PHYSICIAN:  Maryelizabeth Rowan, MD   NEPHROLOGIST:  Nelle Don. Sherrod, NP   HISTORY OF PRESENT ILLNESS:  Please see previously dictated history and  physical.  Briefly, she is a 60 year old female with end-stage renal  disease on dialysis who presents with nausea and vomiting consistent  with gastroparesis.   DISCHARGE DIAGNOSES:  1. Gastroparesis resolved with addition of erythromycin and rest.  2. Hypertension.  3. Diabetes.  4. Coronary artery disease with ischemic cardiomyopathy.  5. Depression.  6. Cataract surgery.  7. History of pancreatitis.  8. Cystitis treated with vancomycin through March of 2010.  9. History of above-knee amputation.  10.Chronic obstructive pulmonary disease.  11.Obstructive sleep apnea.  12.Double-vessel disease.  13.History of cardiac arrest.   DISCHARGE MEDICATIONS:  1. Ambien 5 mg p.o. p.r.n. at bedtime.  2. Oxycodone 5/325 p.r.n.  3. Prilosec 20 mg p.o. daily.  4. Renagel 2400 mg 3 times a day.  5. Folate p.r.n.  6. Lopressor XL 12.5 mg daily.  7. Zyprexa 2.5 mg daily.  8. Reglan 10 mg 3 times a day.  9. Wellbutrin XL 150 mg daily.  10.Erythromycin tab 500 mg 3 times a day.   HOSPITAL COURSE:  1. Gastroparesis.  The patient was initially put on bowel rest and      diet was advanced as well as erythromycin was started.  The      patient's nausea and vomiting subsided and she was able to tolerate      p.o.  She had no episodes of nausea or vomiting more than 24 hours      prior to discharge.  2. End-stage renal disease.  The patient was followed by Nephrology      and  continued her dialysis.  3. Positive urine culture.  The patient did have a positive culture of      Citrobacter koseri noted at discharge and she was sent out on this      antibiotic.   LABORATORY DATA:  At discharge, the patient had no significant lab  abnormalities.      Gardiner Barefoot, MD  Electronically Signed     RWC/MEDQ  D:  05/31/2008  T:  06/01/2008  Job:  [id number]   cc:   Maryelizabeth Rowan, M.D.  Tracey P. Sherrod, NP

## 2010-07-08 NOTE — H&P (Signed)
NAMEANNALIZA, [name]                 ACCOUNT NO.:  [id number]   MEDICAL RECORD NO.:  [id number]          PATIENT TYPE:  INP   LOCATION:  6703                         FACILITY:  [facility]   PHYSICIAN:  Terrial Rhodes, M.D.DATE OF BIRTH:  02-Jan-1951   DATE OF ADMISSION:  05/03/2007  DATE OF DISCHARGE:                              HISTORY & PHYSICAL   CHIEF COMPLAINT:  Shaking hands.   HISTORY OF PRESENT ILLNESS:  Ms. [name] is a 60 year old African American  woman with an extensive past medical history who presents to the [facility] Emergency Department today complaining of bilateral upper extremity  shaking since awakening from sleep this morning at 8:30 a.m.  The  patient reports that she was in her usual state of health until this  morning, when she awoke to find that her bilateral upper extremities  were shaking nearly uncontrollably.  She noted at the time that she was  unable to use her hands to feed herself because of the shaking, and thus  had her brother feed her.  Because of the shaking, the patient reports  that she was unable to make it to dialysis, though cannot explain why.  She denies any associated symptoms including chest pain, abdominal pain,  diaphoresis, nausea, vomiting, and diarrhea, history of falls, weakness,  numbness, tingling, change in appetite, headache, or change in vision.  Of note, the patient had a recent left lower extremity amputation  approximately 4 months ago and is wheelchair bound.  She denies any  history of recent falls.  She has end-stage renal disease and is nearly  anuric, voiding approximately once every 2 to 3 months.  On remaining  review of systems, the patient denies any history of dysarthria or  facial droop.  She has no sick contacts except for a chronically ill  mother at home.   PAST MEDICAL HISTORY:  1. Peripheral vascular disease.  2. Left foot gangrene status post left AKA on December 20, 2006.  3. End-stage renal disease, on  hemodialysis Tuesdays, Thursdays, and      Saturdays at [facility].  4. Insulin-dependent diabetes mellitus.  5. Gastroparesis secondary to diabetes mellitus.  6. Thrombosed left forearm AV graft, no longer in use.  7. Status post ultrasound-guided placement of right internal jugular      Diatek catheter on March 31, 2007.  8. History of cerebrovascular accident in 1998.  9. History of psychosis.  10.Anemia of chronic disease/chronic renal failure.  11.Coronary artery disease status post myocardial infarction.  12.Presumed ischemic cardiomyopathy with an ejection fraction of 20%      to 25%.  13.Pulmonary hypertension.  14.Secondary hyperparathyroidism.  15.COPD.  16.Bronchial asthma.  17.Obstructive sleep apnea syndrome.  18.Morbid obesity.  19.Dyslipidemia.   MEDICATIONS:  1. Metoprolol 12.5 mg by mouth nightly.  2. Reglan 10 mg by mouth 4 times daily, with meals and at bedtime.  3. Renagel 800 mg by mouth 3 times daily with meals.  4. Zocor 40 mg by mouth nightly.  5. Prilosec 20 mg by mouth daily.  6. Neurontin 100 mg by mouth  3 times daily.  7. Wellbutrin 150 mg by mouth daily.  8. Nephrovite 1 tablet by mouth daily.  9. Zyprexa 2.5 mg by mouth nightly.  10.NPH insulin, 10 units in the morning and 10 units in the evening.   ALLERGIES:  1. ASPIRIN CAUSES HIVES.  2. CAPTOPRIL CAUSES COUGH.  3. CODEINE CAUSES RASH.   REVIEW OF SYSTEMS:  As per HPI.   SOCIAL HISTORY:  The patient currently lives with her mother, brother,  and sister.  She denies any history of smoking and is a nondrinker.  She  has no history of drug use.   FAMILY HISTORY:  Noncontributory.   PHYSICAL EXAMINATION:  VITAL SIGNS:  Temperature 97 degrees Fahrenheit,  blood pressure 122/66, respiration rate 20, pulse 72, oxygen saturation  93% on room air.  GENERAL:  Awake, alert and oriented x3, in no acute distress.  An  African American woman who appears older than her stated  age.  HEENT:  Pupils equal, round, regular, and reactive to light and  accommodation.  Extraocular movements intact.  No scleral icterus.  No  conjunctival injection.  Dry mucous membranes.  NECK:  Supple without jugular venous distention.  No palpable  lymphadenopathy.  No palpable goiter.  Hirsute.  CARDIOVASCULAR:  Regular rate and rhythm without murmurs, rubs, or  gallops.  CHEST:  Clear to auscultation without wheezes or crackles.  Right  internal jugular Diatek catheter is present in the right subclavian  location.  ABDOMEN:  Soft, nontender, nondistended with normoactive bowel sounds.  EXTREMITIES:  Left lower extremity AKA.  Right lower extremity with  venostasis and xerosis.  Peripheral pulses are poorly palpable in the  bilateral upper extremities and right lower extremities.  NEUROLOGIC:  Nonfocal.  Bilateral upper extremity tremors at the hand  and wrist, but no asterixis.  Strength 5/5 in the bilateral upper  extremities and hands.  SKIN:  The patient has a documented history of a 2 x 2-cm sacral  decubitus at stage 2.   LABORATORY DATA:  Laboratory studies on admission revealed:  Sodium 140,  potassium 7.1, chloride 104, bicarbonate 18, BUN 82, creatinine 9.09,  glucose 102, calcium 9.0.  White blood cell count 7.7, hemoglobin of  14.1, hematocrit 45.1, platelets 239, MCV 92.7, RDW 20.3.  Blood  cultures and a chest x-ray have been ordered and these results are  pending at the time of this dictation.   ASSESSMENT AND PLAN:  1. Hyperkalemia.  Given elevated potassium in spite of the lack of EKG      findings, patient will go for urgent hemodialysis.  Orders have      been written to begin with a 0-K bath for approximately 30 minutes,      then transition to a 1-K bath for approximately 1 hour, then      transition to a 2-K bath for the remainder of the potassium during      dialysis and adjust potassium bath as necessary.  It is unclear      what has caused this acute  episode of hyperkalemia, as the patient      was last dialyzed on Saturday and was due for dialysis again today.      Usually, associate hyperglycemia with hyperkalemia, instead of      hypoglycemia with hyperkalemia.  It is possible that the patient's      catheter is poorly functioning, especially given low blood flow      rates per past dialysis notes, but the  patient's recent visit with      Dr. Arbie Cookey on February 20th suggests that a chronic catheter is most      likely her only option given lack of brachial pulse on the right      side.  2. Upper extremity tremor.  Primary differential diagnoses include      hypoglycemia and extrapyramidal symptoms from long-term Reglan use.      The patient reports that EMS took her blood sugar on arrival to her      home and it was in the 50s, though I do not have an EMS run sheet      available to confirm this.  The patient reports that she was given      a sugar solution, and her blood glucose on admission to [facility]      was 112 by i-STAT.  For now, we will hold her NPH and put her on a      sensitive sliding scale coverage only.  We will also hold her      Reglan for now.  3. End-stage renal disease.  As above, the patient will be dialyzed      urgently today given her hyperkalemia.  More consideration may be      given to replacing her catheter or transitioning over to a graft or      fistula, though it seems that prior discussion of this has been      futile.  4. Hypertension.  For now, we will continue the patient's home dose of      metoprolol with parameters to hold for systolic blood pressure less      than 110 or heart rate less than 60.  5. Secondary hyperparathyroidism.  We will continue the patient's dose      of Zemplar per her outpatient regimen.  With regard to binders, we      will continue the patient's dose of Renagel and check a phosphorus      level.  6. Anemia.  The patient has a history of anemia in the past, though       recent dialysis notes demonstrate she has not been on Aranesp.      However, we will continue on her dose of NSAID per outpatient      notes.  7. Gastroesophageal reflux disease.  We will convert the patient's      dose of omeprazole to p.o. Protonix while in the hospital.  8. Prolonged QRS and prolonged QT.  This appears to be a chronic issue      by EKGs in the past, though I do not want to exacerbate it while      the patient is hyperkalemic.  So, for now, I will hold her dose of      Zyprexa.  9. Vascular access via right IJ Diatek catheter.  10.Disposition.  Assuming the patient's potassium corrects well today,      it is likely that she can be discharged home tomorrow to continue      her usual outpatient regimen.      Madelaine Etienne, MD  Electronically Signed     ______________________________  Terrial Rhodes, M.D.    JH/MEDQ  D:  05/03/2007  T:  05/04/2007  Job:  [id number]

## 2010-07-08 NOTE — Procedures (Signed)
EEG NUMBER:  [id number].   HISTORY:  This is a 60 year old with end-stage renal disease and altered  mental status, who is having EEG done to evaluate for seizures.   PROCEDURE:  This is a portable EEG.   TECHNICAL DESCRIPTION:  Throughout this portable EEG there was no  distinct or sustained posterior Derm rhythm noted.  The background  activity is symmetric, mostly comprised of low-voltage, slow-wave  activity mostly in the theta range at 10-20 mV.  Occasionally there is  frontal intermittent rhythmic delta or FIRDA noted.  Photic stimulation  or hyperventilation performed throughout this recording.  The patient  does fall asleep during this tracing.  Throughout this record there is  no evidence of electrographic seizures or interictal discharge activity.  EKG tracing shows a heart rate of 90 beats per minute with occasional  PVCs noted.   IMPRESSION:  This routine EEG is abnormal, secondary to diffuse slowing  and FIRDA .  The diffuse slowing is suggestive of a toxic metabolic or  primary neuronal disorder.  The presence of FIRDA can also be seen in  deep midline structural lesions or hydrocephalus.  Clinical correlation  is advised.      Bevelyn Buckles. Nash Shearer, M.D.  Electronically Signed     ZOX:WRUE  D:  12/22/2006 17:45:36  T:  12/23/2006 12:05:23  Job #:  [id number]

## 2010-07-08 NOTE — Discharge Summary (Signed)
NAMECHER, FRANZONI                 ACCOUNT NO.:  [id number]   MEDICAL RECORD NO.:  [id number]          PATIENT TYPE:  EMS   LOCATION:  MAJO                         FACILITY:  [facility]   PHYSICIAN:  Cynthia B. Eliott Nine, M.D.DATE OF BIRTH:  1950/08/22   DATE OF ADMISSION:  10/24/2006  DATE OF DISCHARGE:  10/24/2006                               DISCHARGE SUMMARY   [facility]IAL[facility]IS [facility]ENTER:  [facility].   REASON FOR ADMISSION:  Dyspnea, generalized pruritus, and nausea and  vomiting   DISCHARGE DIAGNOSES:  1. Dyspnea and generalized pruritus likely secondary to Ativan      withdrawal.  2. Nausea and vomiting, multifactorial.  3. Anxiety and depression.  4. End-stage renal disease on hemodialysis secondary to diabetes.  5. Diabetes.  6. Coronary artery disease status post myocardial infarction x3.  7. Ischemic dilated cardiomyopathy with ejection fraction of 20-25%.  8. Hypertension.  9. Anemia.  10.Secondary hyperparathyroidism.  11.Obstructive sleep apnea with chronic obstructive pulmonary disease,      asthma, and pulmonary hypertension.  12.History of cerebrovascular accident in 1998.   DISCHARGE MEDICATIONS:  1. PhosLo 667 mg 1 tablet p.o. t.i.d. with meals (this is decreased      from 3 tablets p.o. t.i.d.).  2. Ativan 0.5 mg p.o. b.i.d. as needed, dispense #28 with no refills.  3. Reglan 10 mg p.o. q.a.c. and q.h.s. (the patient may hold if nausea      remains resolved, given normal gastric emptying study).  4. Renalvite vitamin p.o. daily.  5. Insulin 70/30, 40 units every morning and 28 units every evening.  6. Zocor 40 mg p.o. daily.  7. Omeprazole 20 mg p.o. daily.  8. Albuterol MDI inhaler every 4 hours as needed.  9. Home oxygen as previously prescribed.  10.Metoprolol 12.5 mg p.o. b.i.d.  Hold in the a.m. prior to dialysis      (new medication).  11.Digoxin 0.125 mg p.o. every other day (new medication).  12.Citalopram 20 mg p.o. daily (new  medication).  13.Coumadin 3 mg p.o. daily (new medication).   The patient is to stop taking Coreg and tramadol at the time of  discharge secondary to nausea and itching.   PERTINENT LABORATORY DATA:  At the time of discharge, the patient's  hemoglobin is 11.1, potassium 3.8, phosphorus 6.1, calcium 9.0, albumin  2.8, pro time 27, and INR of 2.4.   PROCEDURES:  1. Chest x-ray on admission showed pulmonary edema.  2. Gastric emptying study showed normal gastric emptying time.  3. Stat noncontrast head CT after fall was negative for acute      intracranial processes.   CONSULTATIONS:  1. Ricki Rodriguez, M.D. with cardiology was consulted.  2. Jordan Hawks. Elnoria Howard, M.D. with gastroenterology was consulted.   BRIEF HOSPITAL COURSE:  This is a 60 year old African American female  with end-stage renal disease on hemodialysis secondary to diabetes, also  with congestive heart failure and pulmonary hypertension admitted with  shortness of breath, nausea, vomiting, and generalized itching.   Problem 1.  Shortness of breath.  Resolved quickly after restarting the  patient's home  dose of Ativan which she had run out of 1-2 weeks prior  to admission.  This was felt to be secondary to withdrawal and anxiety.   Problem 2.  Generalized pruritus.  Also improved after restarting the  patient's home dose of Ativan and also felt secondary to withdrawal.   Problem 3. Nausea and vomiting.  Waxed and waned throughout  hospitalization and was felt multifactorial in etiology.  Improved  somewhat after holding tramadol and Coreg.  GI consultation was obtained  given unclear etiology, and the patient underwent gastric emptying study  which was normal.  The patient had carried previous diagnosis of  gastroparesis.  However, was not receiving any benefit from Reglan 10 mg  q.i.d.  The patient's nausea and vomiting seemed to improve completely  on its own and remained improved without the use of Reglan.  The  patient  was able to eat 3 full meals on both the day prior to and the day of  discharge.   Problem 4.  Anxiety and depression.  Appeared severely increased upon  admission with significantly flattened affect, lethargy, and lack of  motivation.  Dr. Jeanie Sewer with psychiatry was consulted and recommended  initiation of Celexa 10 mg p.o. daily for 1 week and then 20 mg p.o.  daily.  The patient will increase to 20 mg p.o. daily at the time of  discharge.  Some improvement was noted in her mood and affect just prior  to discharge.  Long-term intention is to be able to stop Ativan.  The  patient was however continued on prior home dose of Ativan secondary to  withdrawal symptoms, as mentioned above.   Problem 5.  End-stage renal disease on hemodialysis.  The patient was  continued on home scheduled dialysis of Tuesday, Thursday, and Saturday.   Problem 6.  Diabetes.  CBCs remained relatively stable on home dose of  insulin with sliding scale insulin coverage as needed.   Problem 7.  Cardiovascular.  Given the patient's shortness of breath on  admission and mildly elevated cardiac enzymes, Dr. Algie Coffer was consulted  and followed the patient throughout hospitalization.  She was initiated  on Coumadin and Lovenox therapy for her low ejection fraction and  mention of possible ICD placement in the future if ejection fraction  does not improve was considered.  Cardiologist also wanted to perform  cardiac catheterization; however, the patient refused and is to  reconsider at a later time in the outpatient setting.  The patient was  started on digoxin and low-dose metoprolol during hospitalization.  She  was initially on higher doses and developed a bradycardia which was  found to be type 2 secondary to AV block.  Thus, these agents were held  for 24 hours and then begun at lower dose.  The patient remained stable  without any symptoms of dizziness or bradycardia prior to discharge on  the  lower dose.   Problem 8.  Hypertension.  The patient's blood pressures remained on the  low to normal side on minimal medications.   Problem 9.  Anemia.  Hemoglobin remained relatively stable.  The patient  is not on Epogen or iron.   Problem 10.  Secondary hyperparathyroidism.  The patient's Zemplar and  PhosLo were initially held given elevated calcium and correcting for  albumin.  The patient's PhosLo was begun at 1 tablet p.o. t.i.d. in  place of 3 tablets at the time of discharge, and Zemplar will continue  to be held for elevated calcium.  The patient will need further  monitoring in the outpatient setting.   Problem 11.  Pulmonary hypertension and obstructive sleep apnea.  The  patient is on chronic home oxygen therapy and was continued on this  throughout hospitalization.  The patient refused to use CPAP at night.  Cardiologist began the patient on sildenafil for pulmonary hypertension  in the hospital.  However, the patient is unable to afford this upon  discharge; thus, it will not be continued.  Recommended the patient  applying for Medicaid if eligible.   DISCHARGE INSTRUCTIONS:  The patient is to follow a renal diabetic diet  and increase activity as tolerated.  The patient will be discharged home  with home health PT and OT.   FOLLOWUP APPOINTMENTS:  1. The patient is to follow up with Dr. [name] regarding her left      gangrenous toe on November 16, 2006 at 9:00 a.m.  2. The patient is follow up with Dr. Algie Coffer, her cardiologist, on      November 08, 2006 at 10:30 a.m.   DISPOSITION:  The patient was discharged home in stable medical  condition with home health PT and OT.      Drue Dun, M.D.  Electronically Signed     ______________________________  Duke Salvia Eliott Nine, M.D.    EE/MEDQ  D:  10/22/2006  T:  10/24/2006  Job:  [id number]

## 2010-07-08 NOTE — Op Note (Signed)
NAMEBRAIDEN, PRESUTTI                 ACCOUNT NO.:  [id number]   MEDICAL RECORD NO.:  [id number]          PATIENT TYPE:  OIB   LOCATION:  6702                         FACILITY:  [facility]   PHYSICIAN:  [name]. [name], M.D.DATE OF BIRTH:  05-25-1950   DATE OF PROCEDURE:  07/08/2007  DATE OF DISCHARGE:                               OPERATIVE REPORT   PREOPERATIVE DIAGNOSIS:  Gangrene of the right second toe.   POSTOPERATIVE DIAGNOSIS:  Gangrene of the right second toe.   PROCEDURE:  Amputation of the right second toe.   SURGEON:  [name]. [name], MD   ANESTHESIA:  Ankle block.   INDICATIONS:  This is a 60 year old woman who presented with dry  gangrene at the tip of her right great toe.  She has a patent fem-pop  bypass graft with severe tibial occlusive disease beneath that with no  real good options for further revascularization.  We then following her  toe wound.  However, she felt she was having significant pain and wished  to proceed with toe amputation.  Despite the risk of nonhealing, which  we discussed multiple times.   TECHNIQUE:  The patient was taken to the operating room after an ankle  block was placed by anesthesia.  The right foot was prepped and draped  in usual sterile fashion.  A fishmouth incision was made encompassing  the right second toe, and the proximal phalanx was divided and rongeured  back to healthy bone.  The toe was removed in its entirety.  Hemostasis  was obtained in the wound.  The wound was irrigated with copious amounts  of saline.  The skin was closed with 4 interrupted 4-0 nylon sutures.  A  sterile dressing was applied.  The patient tolerated the procedure well,  was transferred to recovery room in satisfactory condition.  All the  needle and sponge counts were correct.      [name]. [name], M.D.  Electronically Signed     CSD/MEDQ  D:  07/08/2007  T:  07/09/2007  Job:  [id number]

## 2010-07-08 NOTE — H&P (Signed)
HISTORY AND PHYSICAL EXAMINATION   May 10, 2007   Re:  [name], [name]                 DOB:  02-Oct-1950   REASON FOR OUTPATIENT VISIT:  Ischemic right second toe.   HISTORY:  I saw the patient in the office on May 10, 2007, with a new  complaint of discoloration of her right second toe and pain in her right  foot.  She states that she had the discoloration in her toe for  approximately a month.  She does not remember any specific injury to the  toe.  Of note, she is status post previous left above the knee  amputation and currently is nonambulatory.  She has had rest pain in the  right foot.   Her past vascular history is quite complicated.  On January 05, 2003,  she had a left femoral to posterior tibial artery bypass with a  composite 6 mm PTFE and lesser saphenous vein graft.  On Jul 11, 2003,  she had an amputation of the right first toe.  On January 11, 2003, she  had a ray amputation of the left great toe.  On March 27, 2003, she  had a right femoral to below knee popliteal artery bypass graft with a  nonreverse saphenous vein graft.  On December 20, 2006, she had a left  above the knee amputation.  She also has had multiple previous access  procedures and most recently was seen by Dr. Arbie Cookey and felt not to be a  good candidate for a right arm graft which was really her last option  for longterm access.  She did have an attempted duplex of a graft on  February 29, 2009.  However, because of her body habitus and pain it was  a limited study.  It was felt that her graft was likely open and she was  felt to possibly have some distal popliteal disease and potentially an  occluded popliteal artery below her graft.   PAST MEDICAL HISTORY:  1. Her past medical history is significant for chronic renal      insufficiency.  She dialyzes on Tuesdays, Thursdays and Saturdays.  2. Insulin dependent diabetes mellitus.  3. History of neuropathy.  4. History of  retinopathy.  5. History of gastroparesis.  6. History of coronary artery disease with previous myocardial      infarctions.  7. History of ischemic and dilated cardiomyopathy with an ejection      fraction of 20-25%.  8. Hypertension.  9. Anemia.  10.Secondary hyperparathyroidism.  11.COPD.  12.History of pulmonary hypertension.  13.History of asthma.  14.History of depression.  15.History of anxiety.  16.History of gastroesophageal reflux disease.  17.History of a stroke.   MEDICATIONS:  1. Her medications according to her discharge summary on May 05, 2007, are metoprolol 12.5 mg p.o. daily at bedtime.  2. Renagel 800 mg p.o. t.i.d. with meals.  3. Zocor 40 mg p.o. daily at bedtime.  4. Prilosec 20 mg p.o. daily.  5. Neurontin 100 mg p.o. t.i.d.  6. Wellbutrin XL 150 mg p.o. daily.  7. Nephrovite one p.o. daily.  8. Zyprexa 2.5 mg p.o. daily at bedtime.  9. NPH insulin 5 units subcu q. a.m., 5 units subcu q. P.m.  10.Zemplar 4 mcg IV on Tuesdays, Thursdays and Saturdays with      hemodialysis.  11.INFeD 100 mg IV Thursdays.  12.Levocarnitine 2000 mg IV  on Tuesdays, Thursdays and Saturdays with      hemodialysis.  She was also receiving vancomycin 1 g IV Tuesdays,      Thursdays and Saturdays after hemodialysis.   ALLERGIES:  CODEINE, CAPTOPRIL, ASPIRIN.   SOCIAL HISTORY:  She lives at home currently.  She was recently  discharged from skilled nursing facility.  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  She  denies alcohol and tobacco use.   FAMILY HISTORY:  Her mother is 1.  She has hypertension and a weak  heart.  Her father died from complications of diabetes.  She is unaware  of any history of premature cardiovascular disease.   REVIEW OF SYSTEMS:  CONSTITUTIONAL:  Her appetite has been poor.  She is  unaware of any weight loss or weight gain.  She has had no fevers that  she is aware of.  HEENT:  Unremarkable.  CARDIOVASCULAR:  She has had no recent chest pain, chest  pressure,  palpitations or arrhythmias.  PULMONARY:  She does have a history of asthma.  She has had no recent  wheezing of bronchitis.  GI:  She has had no recent change in her bowel habits.  She does have a  history of gastroparesis.  PSYCHIATRIC:  She has a history of anxiety and depression.  She has been  seen in the past by Dr. Jeanie Sewer.  GU:  She does not make significant urine.  NEUROLOGICAL:  She has a history of a stroke in the past.  She has had  no recent focal weakness or paresthesias.  She has had no dizziness,  blackouts, headaches or seizures recently.  HEMATOLOGIC:  She has had no bleeding problems or clotting disorders.   PHYSICAL EXAMINATION:  General:  This is a pleasant 60 year old woman  who appears her stated age.  She has obesity.  Vital signs:  Blood  pressure is 102/53, heart rate is 73.  HEENT:  Extraocular motions are  intact.  I do not detect any carotid bruits.  There is no cervical  lymphadenopathy.  Lungs:  Clear bilaterally to auscultation.  Cardiovascular:  She has a regular rate and rhythm.  Abdomen:  Her  abdomen is obese and difficult to assess.  She has palpable femoral  pulses although they are difficult to feel because of her body habitus  and she is sitting in a wheelchair.  I cannot palpate a right popliteal  pulse or pedal pulse.  She does have a monophasic posterior tibial  signal with a Doppler on the right.  I cannot get a dorsalis pedis or  peroneal signal.  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  Neurological:  Nonfocal.   On the right foot she has a black discoloration of the right second toe  with dry gangrene.  I think the toe will likely progress given that she  has evidence of tibial occlusive disease, it is not clear also whether  her graft is patent.  I have recommended that we proceed with  arteriography to see if there are any options for revascularization.  Currently I think if she underwent toe amputations she likely does not   have adequate circulation to heal this.  She has markedly calcified  vessels and ABIs and even toe pressures are futile in her.  If she is a  candidate for revascularization obviously this would be associated with  significant risk given her multiple medical issues including ischemic  dilated cardiomyopathy with an ejection fraction of 20-25%.  If  ultimately the best and safest option may be primary amputation.  We  will make further recommendations pending the results of her  arteriogram.  I have discussed the procedure with her in the office  today and all of her questions were answered and she is agreeable to  proceed.   [name]. [name], M.D.  Electronically Signed   CSD/MEDQ  D:  05/10/2007  T:  05/11/2007  Job:  [id number]   cc:   [facility]

## 2010-07-08 NOTE — H&P (Signed)
[name], [name]                 ACCOUNT NO.:  [id number]   MEDICAL RECORD NO.:  [id number]          PATIENT TYPE:  OBV   LOCATION:  6740                         FACILITY:  [facility]   PHYSICIAN:  Terrial Rhodes, M.D.DATE OF BIRTH:  1951-01-28   DATE OF ADMISSION:  07/17/2008  DATE OF DISCHARGE:                              HISTORY & PHYSICAL   CHIEF COMPLAINT:  Feeling weak and bad.   HISTORY OF PRESENT ILLNESS:  This is a 60 year old African American  female with end-stage renal disease, on hemodialysis, Tuesday, Thursday,  and Saturdays at [facility] that presented to the  [facility] ED after missing her hemodialysis today because of weakness  and not feeling well.  She denies any fevers or chills.  She has  presented to [facility] ED every month since been discharged to nursing  home in February.  She has a poor home environment and many  comorbidities.  She is likely volume overloaded.   PAST MEDICAL HISTORY:  1. End-stage renal disease, on hemodialysis, Tuesday, Thursday, and      Saturday.  2. Gastroparesis.  3. Diabetes.  4. Coronary artery disease.  5. Cardiomyopathy.  6. History of diskitis.  7. GERD.  8. Depression.  9. General anxiety disorder.  10.Secondary hyperparathyroidism.  11.Hypertension.  12.History of left above-the-knee amputation.  13.COPD.  14.Obstructive sleep apnea.  15.Peripheral vascular disease.  16.History of  V tach and v fib with cardiac arrest.   MEDICATIONS:  1. Phenergan 25 mg suppository q.6 h. p.r.n. for nausea and vomiting.  2. Ambien 5 mg p.o. at bedtime.  3. Lantus 20 units at bedtime.  4. Reglan 10 mg p.o. t.i.d. with meals.  5. Colace 100 mg p.o. b.i.d.  6. Omeprazole 20 mg p.o. daily.  7. Albuterol 90 mcg MDI q.4 h. p.r.n. for wheezing.  8. Plavix 75 mg p.o. daily.  9. Renagel 800 mg 3 tablets t.i.d. with meals.  10.Combivent 1-2 puffs q.i.d. p.r.n. for wheezing.  11.MiraLax 17 g p.r.n. for  constipation.  12.Percocet 5/325 1-2 tablets p.o. q.4 h. p.r.n. for pain.  13.Bupropion HCl 150 mg p.o. daily  14.Zyprexa 2.5 mg p.o. at bedtime.  15.Metoprolol 12.5 mg p.o. at bedtime.  16.Nephro-Vite 1 tablet p.o. daily.  17.Nepro 8 ounces b.i.d.  18.Neurontin 1 mg p.o. t.i.d.  19.Zocor 40 mg p.o. at bedtime.   ALLERGIES:  ASPIRIN, causes hives.  CAPTOPRIL, causes cough.  CODEINE,  causes hives.   Hemodialysis prescription, frequency again is on Tuesday, Thursday, and  Saturday estimated dry weights.  Estimated dry weight 90 kg.  Length of  treatment 4 hours 13 minutes.  Access is a PermCath in the left IJ.  She  is on Hectorol 6 mcg IV 3 times a week with hemodialysis.   SOCIAL HISTORY:  She lives at home with her mother.  She is on  disability.  No tobacco, no alcohol, no drug use.   FAMILY HISTORY:  Mother had diabetes.  Father had diabetes.   REVIEW OF SYSTEMS:  Difficult to truly reassess the patient, unable to  answer questions coherently.  PHYSICAL EXAMINATION:  VITAL SIGNS:  Pulse 84, blood pressure 132/46.  GENERAL:  The patient currently undergoing emergent hemodialysis after  missing her hemodialysis today for being fluid overloaded.  She is an  obese female with labored breath in a flat affect.  HEENT:  Extraocular movements intact.  Pupils equal, round, reactive.  Sclerae clear.  NECK:  Supple without lymphadenopathy.  CARDIOVASCULAR:  Irregular.  LUNGS:  Decreased breath sounds, bibasilar crackles.  ABDOMEN: Soft, nontender, nondistended.  EXTREMITIES:  Left is status post AKA access.  She has got a PermCath  the left IJ.  NEURO:  She is alert and oriented x3 but difficult to answer questions  and follows commands.  Her cranial nerves II through XII grossly intact.   Chest x-ray, cardiomegaly with pulmonary vascular congestion.   LABORATORY DATA:  Hemoglobin of 14, white blood cell count 7.9,  platelets of 219.  Sodium 138, potassium 3.5, chloride 109, BUN  of 60,  creatinine 9.4, and glucose of 113.   ASSESSMENT AND PLAN:  This is a 60 year old African American female with  multiple medical problems, notably for ischemic cardiomyopathy with  ejection fraction 20%, obesity, peripheral vascular disease, diabetes,  gastroparesis, inoperable coronary artery disease, end-stage renal  disease who presents with weakness, nausea, malaise, and failure to  thrive.  1. Weakness:  Unclear etiology; although, it is thought that this may      likely be to arrhythmias.  During her hemodialysis, she did note to      have a bout of narrow complex tachycardia with a rate up to 144      that spontaneously resolved. The patient has multiple end-stage      irreversible medical problems.  Differential diagnoses for her      weakness again is possible arrhythmias, possible recurrent      diskitis, urinary tract infection, myocardial infraction,      depression, cath infection.  Our plan is to admit and workup to      start as inpatient.  She also needs case manager social work to      look into nursing home facility, as the patient has been in the      hospital every month in the last 4 months since she left the      nursing home in February.  2. Shortness of breath.  The patient has pulmonary edema and missed      dialysis today.  Last treatment was on Jul 14, 2008.  Plan for      urgent hemodialysis and ultrafiltration tonight.  We will provide      O2 via nasal cannula to keep sats above 92%.  3. Cardiomyopathy.  We will rule out myocardial infraction.  We      continue her on the Plavix and place on telemetry.  We will consult      Cardiology given her bouts of tachycardia here.  4. Diskitis.  We will check blood cultures.  5. Anemia.  Her hemoglobins has been above 13; therefore EPO has been      hold.  6. Secondary hyperparathyroidism.  Continue on Renagel as stated.      Continue on Hectorol.  Follow calcium  and phosphate.  7. The patient has an  access in the left internal jugular, a PermCath.      We will culture that site.  8. Depression.  Continue home medicines.  9. Diabetes.  Continue on Lantus.  10.Gastroparesis.  Continue on Reglan.   DISPOSITION:  We will need to get PT and OT to evaluate along with  social work.      Marisue Ivan, MD  Electronically Signed     ______________________________  Terrial Rhodes, M.D.    KL/MEDQ  D:  07/17/2008  T:  07/18/2008  Job:  [id number]

## 2010-07-08 NOTE — Discharge Summary (Signed)
[name], EADS NO.:  [id number]   MEDICAL RECORD NO.:  [id number]          PATIENT TYPE:  INP   LOCATION:  6704                         FACILITY:  [facility]   PHYSICIAN:  Hettie Holstein, D.O.    DATE OF BIRTH:  [birth year]   DATE OF ADMISSION:  01/31/2007  DATE OF DISCHARGE:                               DISCHARGE SUMMARY   ADDENDUM:  This is an addendum to a discharge summary dictated by Dr.  Marcellus Scott on February 13, 2007.  This is to summarize events since  that period of time.   The remainder of her course was that of holding and waiting until the  patient was able to demonstrate tolerance of hemodialysis in the  recliner, which she tolerated quite well today.  She has been dialyzed  in a recliner chair today, and recommendations are for her to go with  the overlay to [facility] to use with subsequent outpatient  dialysis. Otherwise, she has had some complaints of tremors, which  appears to be asterixis.  Will plan to check her hepatic function panel.  Patient should be transferred back to [facility] in the a.m.  following medication.  Aranesp should be continued through her dialysis  and [facility].  Her hemoglobin A1C this admission was  5.9.   Nephro-Vite 1 tablet daily.  She can continue Lopressor 12.5 mg daily,  Zocor 40 mg daily, Reglan 10 mg q.a.c., Prilosec 20 mg daily, Colace 100  mg daily, hold if she develops diarrhea.  Zemplar she can continue as  she was on prior to arrival at each dialysis for discretion of her  nephrologist.  Nepro 1 p.o. t.i.d.  Ensure pudding b.i.d.   At her request, we are attempting to simplify her multiple medications  in the outpatient setting.  Stop Wellbutrin at this time.  I have  informed her that if she needs to add an agent for depression, she can  consult with her primary physician or have a referral for psychiatry.  In any event, she is comfortable.  For full details of  her entire  hospitalization, review the discharge summary as dictated by Dr. Marcellus Scott, and append these summaries together to account for her  hospitalization on January 31, 2007 to the day of discharge.      Hettie Holstein, D.O.  Electronically Signed     ESS/MEDQ  D:  02/22/2007  T:  02/23/2007  Job:  [id number]   cc:   Maxwell Caul, M.D.

## 2010-07-08 NOTE — Op Note (Signed)
[name], HOAGLUND                 ACCOUNT NO.:  [id number]   MEDICAL RECORD NO.:  [id number]          PATIENT TYPE:  INP   LOCATION:  5507                         FACILITY:  [facility]   PHYSICIAN:  [name]. [name], M.D.DATE OF BIRTH:  01/16/1951   DATE OF PROCEDURE:  12/20/2006  DATE OF DISCHARGE:                               OPERATIVE REPORT   PREOPERATIVE DIAGNOSIS:  Gangrene of the left foot.   POSTOPERATIVE DIAGNOSIS:  Gangrene of the left foot.   PROCEDURE:  Left above-the-knee amputation.   SURGEON:  Dr. Waverly Ferrari.   ASSISTANT:  Gae Bon, PA.   ANESTHESIA:  General.   TECHNIQUE:  The patient was taken to the operating room and received a  general anesthetic.  The left lower extremity was prepped and draped in  the usual sterile fashion.  A fishmouth incision was marked above the  level of the patella and then the leg was exsanguinated with an Esmarch  bandage. A tourniquet had been placed on the upper thigh and the  tourniquet was inflated to 300 mmHg. Under tourniquet control, the  dissection was carried down to the skin, subcutaneous tissue, fascia,  and muscle to the femur which was dissected free circumferentially.  The  periosteum was elevated and the bone divided proximal to the level of  skin division.  After the leg was removed, the artery and vein were  suture ligated with 2-0 silk ties.  Additional hemostasis was obtained  using electrocautery and 2-0 silk sutures and the tourniquet was  released.  The wound was irrigated with copious amounts of saline.  The  edges of the bone were rasped.  The fascial layer was closed with  interrupted 2-0 Vicryls.  The skin was closed with staples.  A sterile  dressing was applied.  The patient tolerated the procedure well and was  transferred to the recovery room in satisfactory condition.  All needle  and sponge counts were correct.      [name]. [name], M.D.  Electronically Signed     CSD/MEDQ  D:  12/20/2006  T:  12/20/2006  Job:  [id number]

## 2010-07-08 NOTE — Assessment & Plan Note (Signed)
OFFICE VISIT   QUISHA, MABIE  DOB:  10/05/1950                                       06/14/2007  ZHYQM#:[id number]   I saw the patient in the office today for continued followup of her  discoloration of the right second toe.  This is a pleasant 60 year old  woman who had previous right fem-pop bypass graft which is patent.  She  had presented with some discoloration of the right second toe and  underwent an arteriogram which showed a patent graft.  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  Only patent distal vessel was a  posterior tibial artery which is markedly calcified and small.  Came in  today to have a vein mapping.  The only vein that we can really find is  the distal saphenous vein on the right which is quite small and not  usable as a bypass conduit.   The toe remains stable with no erythema or drainage.  Bypass graft is  patent palpable popliteal pulse.   I really do not see any good options for revascularization and I think  we could potentially make the situation worse by attempting to jump from  her fem-pop graft down to her posterior tibial artery as we would have  to disrupt significant collaterals and potentially injure or dissect the  posterior tibial artery, which is markedly diseased and calcified.  On  previous revascularization attempts her arteries are essentially  impossible to sew to because of severe calcific disease.  All things  considered, will continue with conservative treatment and hopefully the  toe will gradually improve.  If the toe worsens we could consider toe  amputation, although she would be at significant risk for this not  healing and she could ultimately required primary amputation on the  right.  I will see her back in 3 weeks.  She knows to call sooner if she  has problems.   [name]. [name], M.D.  Electronically Signed   CSD/MEDQ  D:  06/14/2007  T:  06/15/2007  Job:  [id number]

## 2010-07-08 NOTE — Discharge Summary (Signed)
[name], [name]                 ACCOUNT NO.:  [id number]   MEDICAL RECORD NO.:  [id number]          PATIENT TYPE:  INP   LOCATION:  5507                         FACILITY:  [facility]   PHYSICIAN:  Terrial Rhodes, M.D.DATE OF BIRTH:  1950/03/24   DATE OF ADMISSION:  12/14/2006  DATE OF DISCHARGE:  12/24/2006                               DISCHARGE SUMMARY   ADMITTING DIAGNOSIS:  1. Gangrenous left foot.  2. Dysphasia.  3. End-stage renal disease on chronic hemodialysis.  4. Insulin-dependent diabetes mellitus.  5. History of cerebrovascular accident.  6. Depression and psychosis.  7. Anemia of chronic disease.   DISCHARGE DIAGNOSIS:  1. Status post left above-the-knee amputation, Dr. Waverly Ferrari, December 20, 2006.  2. Non-ST wave change myocardial infarction.  3. End-stage renal disease on chronic hemodialysis.  4. Insulin-dependent diabetes mellitus.  5. Depression and psychosis.  6. Anemia of chronic disease and acute intraoperative blood loss      status post transfusion.  7. Negative swallowing evaluation.  8. History of cerebrovascular accidents in the past.  9. Ischemic cardiomyopathy, resume Coumadin 1 week postoperative      without loading.   BRIEF HISTORY:  A 60 year old, black female with end-stage renal disease  secondary to diabetic nephropathy on chronic dialysis Tuesday, Thursday,  Saturday at the [facility]. She presented to  dialysis with low blood pressure, complaints of dysphasia, anorexia and  weakness.  Nursing home states she has had minimal p.o. intake over the  past 3 days.  She has been followed by the vein and vascular surgeons  who have made preparations for an elective BKA versus AKA for October  27.  In dialysis her blood pressure remained low making dialysis  difficult.  She received IV antibiotics and was transferred to [facility] for  admission to rule out sepsis.   LABS ON ADMISSION:  White count 12,900,  hematocrit 42.9, dig level was  0.3, potassium 3.2, hemoglobin 17 on ISTAT.   HOSPITAL COURSE:  1. Left AKA.  [facility] and again      on admission to [facility].  Vancomycin and Zosyn were used      empirically.  All blood cultures remained negative during this      hospitalization. She remained afebrile except for the first 24-48      hours postoperatively she did spike temperatures as high as 101.7.      She received local care of the foot until October 27 when she      underwent her left AKA by Dr. Waverly Ferrari.  She did have      acute intraoperative blood loss due to very calcified vessels from      diabetes and chronic renal failure.  For this she required 2 units      packed red blood cells transfused on dialysis on the 30th of      October. Thereafter hemoglobin has stabilized at approximately 10-      10.5. She had a small hematoma develop at the left AKA  stump which      has been stable in the last 24 hours.  The surgeon evaluated the      patient on the morning of discharge and felt that the wound looked      fine. There was no more bloody drainage and we will set up a follow-      up with Dr. [name] as an outpatient in a couple of weeks.      Antibiotics will be continued for one more week per Dr. Darrick Penna      and we will dose vancomycin at the [facility] and give her oral      ciprofloxacin 250 mg daily through November 6.  2. Coronary artery disease/NST MI.  The patient became hypotensive      postoperatively.  Workup included an EKG which showed no acute      changes.  Serial cardiac markers were elevated, however with a      troponin as high as 1.1.  Dr. Algie Coffer was asked to consult who felt      that the patient was not a candidate for cardiac intervention due      to her multiple medical conditions and recent surgery.  He      recommended continuing with current management which was beta      blocker.  She remained  stable, her hypotension resolved which was      felt to be related to medications and fever and surgery.  Dr.      Algie Coffer recommends a 77-month follow-up in his office after      discharge.  We will have the [facility] arrange this.  3. Insulin-dependent diabetes mellitus.  70/30 insulin b.i.d. was      dosed prior to admission. At the time of discharge her dose is      being decreased due to decreased oral intake.  70/30 insulin will      be dosed at 30 units in the morning and 20 units q.h.s.  We will      have the nursing home clinicians increase as appropriate. She      should have Accu-Cheks done a.c. and h.s. with sliding scale      NovoLog insulin given as 2 units for a blood sugar of 150-200, 4      units for blood sugar 201-250, 6 units for blood sugar 251-300, 8      units for blood sugar 301-350, 10 units for blood sugar 351-400, 15      units for blood sugar over 400 with repeat Accu-Chek in 1 hour.  4. End-stage renal disease.  The patient dialyzed every Tuesday,      Thursday, Saturday at the inpatient unit.  Her new dry weight will      be estimated at 99.0 kg.  She is being discharged to continue      dialysis at the [facility] every Tuesday,      Thursday, and Saturday for 4-hour treatments.  5. Dysphasia.  A swallow evaluation was done. The speech therapist did      not see any signs or symptoms of aspiration.  They observed odd      behavior and severe anxiety.  They felt that the patient may have      had an episode of choking during a recent episode of vomiting.      They felt that she may be now frightened and hypotensive but did  well during their evaluation.  They recommended pureed diet with      medications crushed in applesauce.  The nutritionist followed up      and felt the patient was eating much better with a pureed diet and      recommended continuing it.  She was also receiving and Ensure      pudding b.i.d. and Nepro nutritional  supplements three times a day.      If she should have any recurrence of complaints, an esophagogram      was recommended by speech pathology.  6. Anemia.  The patient's hemoglobin has been in the 13 range as an      outpatient and therefore did not receive any erythropoietin.      During this hospitalization though she did have acute      intraoperative blood loss along with daily venipunctures. Her      hemoglobin dropped to 8.6 for which she received 2 units of packed      red blood cells on the 30th of  October.  Her hemoglobin is 10.5 on      the day of discharge.  We will begin EPO at 10,000 units IV each      dialysis for this.   DISCHARGE MEDICATIONS:  1. EPO 10,000 units IV each dialysis.  2. Vancomycin 1 gram IV each dialysis x3 more doses and then stop.  3. Cipro 250 mg every evening through November 6 and then stop.  4. Rena-Vite vitamin one daily.  5. Reglan 10 mg a.c. and h.s.  6. Zocor 40 mg q.p.m.  7. 70/30 insulin 30 units q.a.m. and 20 units q.p.m. with sliding      scale as mentioned above.  8. Accu-Cheks a.c. and h.s.  9. Zyprexa 5 mg daily.  10.Celexa 20 mg daily.  11.Claritin 10 mg daily.  12.Protonix 40 mg q.h.s.  13.Lopressor 12.5 mg b.i.d to be held for systolic blood pressures      less than 110 or heart rate less than 60.  14.Colace 100 mg daily.  15.Nepro 1 can t.i.d.  16.Ensure pudding b.i.d.  17.Zemplar 6.0 mcg IV each dialysis.  18.Diet 100 grams protein, 2 grams potassium, 2 grams sodium,      carbohydrate modified, 1200 mL fluid restriction, pureed diet with      nutritional supplements.  19.Sorbitol 30-60 mL p.o. p.r.n. constipation.  20.Tylenol 650 mg q.4 h p.r.n.  21.Roxicodone 5 mg 5-10 mg q.4 h p.r.n. severe pain.  22.Phenergan 12.5 mg q.6 h p.r.n. nausea.  23.Dialysis every Tuesday, Thursday, Saturday at [facility].  New dry weight 99.0 kg.      Zenovia Jordan, P.A.    ______________________________   Terrial Rhodes, M.D.    RRK/MEDQ  D:  12/24/2006  T:  12/24/2006  Job:  [id number]   cc:   [name]. [name], M.D.  Ricki Rodriguez, M.D.

## 2010-07-08 NOTE — Assessment & Plan Note (Signed)
OFFICE VISIT   Julia, [name]  DOB:  12-26-1950                                       05/16/2009  ZOXWR#:[id number]   I saw patient in the office today for follow-up after a recent right  above-the-knee amputation.  This is a 58-year woman who had a previous  left above-the-knee amputation.  She presented with gangrene of her  right lower extremity.  She had severe tibial occlusive disease with no  options for revascularization.  She underwent a right above-the-knee  amputation on 04/05/2009.  She returns for her first outpatient visit to  have her staples removed.   On examination, blood pressure is 132/78.  Heart rate is 87.  Temperature is 98.6.  Her AKA on the right has healed nicely.   We removed her staples in the office today.  I will see her back p.r.n.     [name]. [name], M.D.  Electronically Signed   CSD/MEDQ  D:  05/16/2009  T:  05/17/2009  Job:  [id number]

## 2010-07-08 NOTE — Op Note (Signed)
NAMECORALEE, [name]                 ACCOUNT NO.:  [id number]   MEDICAL RECORD NO.:  [id number]          PATIENT TYPE:  AMB   LOCATION:  SDS                          FACILITY:  [facility]   PHYSICIAN:  [name]. [name], M.D.DATE OF BIRTH:  1950/04/17   DATE OF PROCEDURE:  03/04/2007  DATE OF DISCHARGE:                               OPERATIVE REPORT   PREOPERATIVE DIAGNOSIS:  Clotted left forearm AV graft.   POSTOPERATIVE DIAGNOSIS:  Clotted left forearm AV graft.   PROCEDURE:  1. Thrombectomy of left forearm AV graft.  2. Insertion of new segment of graft to the brachial vein.  3. Intraoperative fistulogram x2.   SURGEON:  Dr. [name].   ASSISTANT:  Jerold Coombe, P.A.   ANESTHESIA:  General.   TECHNIQUE:  The patient was taken to the operating room and received a  general anesthetic.  The left upper extremity was prepped and draped in  the usual sterile fashion.  The longitudinal incision was made over the  venous anastomosis and the graft extended very high up on the basilic  vein.  I elected to explore the adjacent brachial vein which was  reasonable size and did take a 5 mm dilator after it was ligated  distally.  Graft thrombectomy was then achieved using a #4 Fogarty  catheter.  This graft had been in for about 6 years and there was  clearly some degeneration throughout the graft but it was otherwise  patent.  I was unable to retrieve the arterial plug through the same  incision. I was able to expose the arterial anastomosis and a small  transverse graftotomy was made. I was then able to directly retrieve the  arterial plug.  I passed a Fogarty catheter from both directions and no  further clot was retrieved. The arteriotomy at the arterial end was  closed with running 6-0 Prolene suture.  Next the new segment of 6-mm  PTFE was brought onto the field, spatulated and sewn end-to-end to the  brachial vein using continuous 6-0 Prolene suture.  The graft was then  pulled to the appropriate length and anastomosed end-to-end to the old  graft using continuous 6-0 Prolene suture.  At the completion, there was  a good thrill in the graft.  I did shoot two intraoperative  fistulograms.  There was some diffuse degeneration throughout the graft  but really no focal problem that could be corrected.  If this graft  clots in the future, she should be evaluated for a new upper arm graft.  Hemostasis was obtained in the wound, the wound was closed with a deep  layer of 3-0 Vicryl and the skin closed with 4-0 Vicryl.  A sterile  dressing was applied.  The patient tolerated the procedure well and was  transferred to the recovery room in satisfactory condition.  All needle  and sponge counts were correct.     [name]. [name], M.D.  Electronically Signed    CSD/MEDQ  D:  03/04/2007  T:  03/04/2007  Job:  [id number]

## 2010-07-11 NOTE — Discharge Summary (Signed)
NAME:  [name], [name]                           ACCOUNT NO.:  [id number]   MEDICAL RECORD NO.:  [id number]                   PATIENT TYPE:  IPS   LOCATION:  4011                                 FACILITY:  [facility]   PHYSICIAN:  Ellwood Dense, M.D.                DATE OF BIRTH:  1950-12-23   DATE OF ADMISSION:  01/15/2003  DATE OF DISCHARGE:  01/30/2003                                 DISCHARGE SUMMARY   DISCHARGE DIAGNOSES:  1. Left toe amputation.  2. End-stage renal disease.  3. Hypertension.  4. Diabetes mellitus.  5. Anemia of chronic disease requiring transfusion.  6. History of depression with anxiety disorder.  7. Constipation.  8. Neuropathy.   HISTORY OF PRESENT ILLNESS:  [name] is a 60 year old female with a history  of end-stage renal disease, DM, and severe peripheral vascular disease with  dry gangrene of left great toe, admitted to [facility] on January 05, 2003, for a left femorotibial bypass graft and left great toe amputation  the same day.  The patient was on IV antibiotics, Ancef, initially.  She  continued with a low-grade fever and was started on IV Zosyn on January 08, 2003, with wet-to-dry dressing changes and whirlpool treatment.  As the  patient continued with poor healing, she underwent left great toe amputation  on January 11, 2003.  She continued to have problems with pain control and  remains on Dilaudid.  The patient is to continue on IV Zosyn for wound  prophylaxis.  The chart indicates that the patient might require AKA or BKA  for poor healing.  The patient has had a steady drop in her H&H from 11.9 to  8.1.  Most recently the Epogen dose was increased and the patient continues  on InFeD.  PT and OT initiated and the patient is at minimal assistance for  upper body care, minimal assistance for transfers, and minimal assistance  for ambulating 18 feet x 2 with rolling walker.  She required maximum  encouragement secondary to left foot  pain.   PAST MEDICAL HISTORY:  1. Coronary artery disease, status post MI in 1998 and pulmonary     hypertension.  2. CVA in 1998.  3. GI bleed.  4. COPD.  5. Left shoulder bursitis secondary to injury.  6. Left ankle fractures.  7. DM type 1.  8. Insomnia.  9. Hypertension.  10.      Iron deficiency anemia.  11.      Anxiety and depression.  12.      Peripheral vascular disease.  13.      Left arm AVG thrombectomy in November of 2004.  14.      Gout.   ALLERGIES:  1. ASPIRIN.  2. CODEINE.  3. CAPTOPRIL.   SOCIAL HISTORY:  The patient lives with her mother in a one-level home with  five to six steps  at entry.  She was independent prior to admission.  She  does not use any tobacco or alcohol.   HOSPITAL COURSE:  [name] was admitted to rehabilitation on January 15, 2003, for inpatient therapies to consist of PT and OT daily.  Post  admission, she continued her hemodialysis three times a week.  Mood was  relatively stable on Wellbutrin and Seroquel.  The PCA Dilaudid pump was  discontinued and pain control attempted with use of OxyContin with p.r.n.  oxycodone.  She was noted to have some increased sedation questionably  secondary to narcotics b.i.d.  She also reported chronic insomnia problems.  These were exacerbated by neuropathy symptoms, especially at night.  The  patient was started on some Neurontin q.h.s. with improvement in her  symptomatology and improved sleep.  Routine laboratories were done with  hemodialysis.  A CBC on January 16, 2003, showed the hemoglobin at 8.0 and  hematocrit at 25.4.  She was transfused with two units of packed red blood  cells with subsequent rise to 10.1 and 32.4, respectively.  The last check  of CBC on January 29, 2003, showed hemoglobin 9.4, hematocrit 29.5, white  count 7.9, and platelets 315.  Stool guaiacs x 2 were done and were  negative.  The last check of electrolytes on January 30, 2003, showed sodium  140, potassium 4.1,  chloride 104, CO2 28, BUN 32, creatinine 6.6, glucose  122, and albumin 2.4.  The patient's blood pressures were reasonably stable  at the time of discharge, ranging from 110s-130s systolic and 60s-70s  diastolic.  Blood sugars were noted to be ranging 110s-120s with an  occasional high in the 170s.  Weight at the time of discharge 127.1 kg.   The patient was maintained on IV Zosyn throughout her stay per CVTS input.  Wound care was done on a b.i.d. basis with Accuzyme and packing.  She  continued to have drainage on her dressing from the incision site.  The  sutures were discontinued on January 24, 2003, and the patient was noted to  have dehiscence of the site.  Yellow eschar was noted at the wound bed.  Dressing changes b.i.d. to continue post discharge to include wet-to-dry  dressing with 4 x 4 gauze and Kerlix.  The patient was changed over to p.o.  Keflex 250 mg t.i.d. x 10 days with followup with Dr. [name] on an  outpatient basis.  The patient was noted to have a depressed mood and Dr.  Leonides Cave, neuropsychiatry, was consulted for input.  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  He questioned the patient flat  affect and mild slowing in part secondary to the effects of pain medications  and benzodiazepine.  He has followed up with the patient during her stay and  he recommended arranging psychiatric or family medical followup to monitor  mood and psychiatric medication regimen post discharge.  During her stay in  rehabilitation, [name] progressed to being modified independent for ADLs,  modified independent for toileting, as well as hygiene, and modified  independent for advanced ADLs.  She was modified independent for transfers  and modified independent for ambulating 150 feet with standard walker with  partial weightbearing.  Further followup therapies to include home health PT by [facility] with home health nurse to do b.i.d. dressing  changes.   DISPOSITION:  On January 30, 2003, the patient was discharged to home.   DISCHARGE MEDICATIONS:  1. PhosLo  667 mg three p.o. t.i.d.  2. Albuterol one puff q.i.d.  3. Flovent 110 mcg two puffs b.i.d.  4. Serevent 50 mcg b.i.d.  5. Nephro-Vite one per day.  6. Zocor 20 mg q.h.s.  7. Toprol XL 25 mg q.h.s.  8. Wellbutrin 100 mg two p.o. per day.  9. Seroquel 25 mg q.h.s.  10.      70/30 insulin 30 units in a.m. and 15 units in p.m.  11.      OxyContin CR 10 mg one p.o. b.i.d.  12.      Ativan 1 mg half p.o. b.i.d.  13.      Neurontin 300 mg p.o. q.h.s.  14.      Epogen 10,000 units IV with hemodialysis on Tuesday, Thursday, and     Saturday.  15.      Calcijex 1.5 mcg IV on Tuesday, Thursday, and Saturday with     hemodialysis.  16.      InFeD 500 mg IV every seven days.  17.      Keflex 250 mg t.i.d.   ACTIVITY:  Touch down weightbearing on right foot with ________ shoe.   DIET:  Renal diet with diabetic restrictions.  Check blood sugars on a  b.i.d. basis.   WOUND CARE:  Wet-to-dry dressing on a b.i.d. basis with packing and cover  with 4 x 4s, Kerlix, and Ace wrap.   SPECIAL INSTRUCTIONS:  No alcohol.  No driving.   FOLLOWUP:  The patient is to follow up with Dr. [name] in one week for  recheck.  Follow up with Dr. Arrie Aran for routine check.      Greg Cutter, P.A.                    Ellwood Dense, M.D.    PP/MEDQ  D:  02/22/2003  T:  02/22/2003  Job:  [id number]   cc:   Terrial Rhodes, M.D.  [address]  [place]  [place] [address]  Fax: [phone]   [name]. [name], M.D.  [address]  [place]  [place] [address]

## 2010-07-11 NOTE — H&P (Signed)
NAME:  [name], [name] NO.:  [id number]   MEDICAL RECORD NO.:  [id number]                   PATIENT TYPE:  INP   LOCATION:                                       FACILITY:  [facility]   PHYSICIAN:  [name]. [name], M.D.        DATE OF BIRTH:  [birth year]   DATE OF ADMISSION:  03/27/2003  DATE OF DISCHARGE:                                HISTORY & PHYSICAL   DATE OF PLANNED ADMISSION:  03/27/03.   REASON FOR ADMISSION:  Dry gangrene of the right great toe with severe  infrainguinal arterial occlusive disease.   HISTORY:  This is a pleasant 60 year old woman who presents with progressive  pain of the right great toe with dry gangrene.  She has been on large doses  of narcotics to control  her pain and the pain has become disabling.  We  discussed the options of attempted revascularization on the right versus  primary amputation.  She has elected to proceed with attempted  revascularization understanding that there is only a marginal chance of  success.   This patient had originally presented with a non-healing wound of the left  great toe.  She underwent an arteriogram on 10/27/02 which showed no  significant aortoiliac occlusive disease.  She was noted to have  infrainguinal arterial occlusive disease bilaterally with run-off via the  posterior tibial arteries only on both sides.  She ultimately underwent a  left femoral to posterior tibulary bypass graft with a composite 6 mm PTFE  plus left greater saphenous vein graft on 01/05/03.  Of note, at the time of  surgery, she was noted to have a markedly calcified artery related to her  diabetes and it was a difficult case.  She ultimately had ray amputation of  the left great toe and we have been treating this open wound on an  outpatient basis with the debridements and aggressive wound care.  During  these follow up visits, the right toe had progressed.   PAST MEDICAL HISTORY:  Her past medical  history is significant for insulin  dependent diabetes which she has had for eight years.  In addition, she has  a history of hypertension.  She also has chronic renal insufficiency and  dialyzes on Tuesdays, Thursdays and Saturdays.  In addition, she had a  myocardial infarction in 1989 and also has a history of congestive heart  failure.  Her cardiologist is Dr. Algie Coffer.  She has no history of  arrhythmias that she is aware of.  She had a stroke in 1989 with no residual  deficit.   FAMILY HISTORY:  On family history, there is no history of premature  cardiovascular disease.   SOCIAL HISTORY:  She is single.  She does not have any children.  She does  not use tobacco.   ALLERGIES:  Codeine and captopril.   MEDICATIONS:  1. PhosLo gelcaps 667 mg three capsules p.o. b.i.d.  2. Zocor 20 mg p.o. q.h.s.  3. Metoclopramide 10 mg two p.o. before each meal.  4. Lorazepam 1 mg p.o. b.i.d.  5. Insulin 70/30 30 units in the morning and 20 units at night.  She takes     insulin 70/30.  6. OxyContin 10 mg one p.o. b.i.d. p.r.n.  7. Hydrocodone.  8. Acetaminophen 7.5/500 one to two p.o. q. 4 to 6 h. p.r.n.   REVIEW OF SYSTEMS:  On review of systems, she has had no recent weight loss,  weight gains or problems with her appetite.  She denies any bronchitis,  asthma or wheezing.  She does have a history of dyspnea on exertion.  She  has had no recent chest pain or chest pressure.  She has had no recent  change in her bowel habits and has no history of peptic ulcer disease.  She  denies any dysuria.  She makes very little urine.  She has no history of  bleeding problems or clotting disorders that she is aware of.  She does have  a history of depression in the past.  Review of systems is otherwise  negative.   PHYSICAL EXAMINATION:  VITAL SIGNS:  On physical examination, blood pressure  is 120/80, heart rate is 84.  HEENT:  There is no cervical lymphadenopathy.  I did not detect any carotid   bruits.  LUNGS:  Clear bilaterally to auscultation.  CARDIAC:  She has a regular rate and rhythm.  ABDOMEN:  Soft and nontender.  PULSES:  She has palpable femoral pulses.  I can not palpate her popliteal  or pedal pulses on the right side.  She has monophasic posterior tibial  signal on the right with the Doppler.  On the left side, she has a palpable  graft pulse.  She has an open wound which is granulating and has some  exudate which was debrided in the office today.  She has moderate swelling  in the left leg.  NEUROLOGICAL:  Nonfocal.   She did undergo a vein mapping in the office today which shows an adequate  size greater saphenous vein from the groin to the ankle.   This patient presents with progressive ischemia of the right foot with limb  threatening ischemia.  Her pain is unbearable and she wishes to proceed with  attempted revascularization.  I have explained given the very calcific  nature of her blood vessels, that this is high risk and that we could  potentially make things worse by attempting to bypass into this markedly  calcified artery.  We discussed the potential option of a primary below the  knee amputation.  We have also previously discussed the potential  complications of wound healing problems, bleeding, graft thrombosis and limb  loss.  All of her questions are answered and she is agreeable to proceed.  Ideally, we had hoped to get the left foot healed first, however, this is  obviously going to take some time if it does heal and she wished to proceed  with bypass in the right leg as soon as possible because of her disabling  pain.  Her surgery has been scheduled for March 27, 2003.                                                [name]. [name], M.D.    CSD/MEDQ  D:  03/14/2003  T:  03/14/2003  Job:  [id number]

## 2010-07-11 NOTE — H&P (Signed)
NAME:  ABELLA, SHUGART                           ACCOUNT NO.:  [id number]   MEDICAL RECORD NO.:  [id number]                   PATIENT TYPE:  INP   LOCATION:  1843                                 FACILITY:  [facility]   PHYSICIAN:  Alvin C. Lowell Guitar, M.D.               DATE OF BIRTH:  July 29, 1950   DATE OF ADMISSION:  10/03/2003  DATE OF DISCHARGE:                                HISTORY & PHYSICAL   REASON FOR ADMISSION:  Nausea and vomiting with epigastric pain.   HISTORY OF PRESENT ILLNESS:  Mrs. Blakeley is a 60 year old African-American  female with a past medical history of end-stage renal disease  (Tuesday/Thursday/Saturday at [facility]), insulin-dependent diabetes mellitus,  hypertension, and depression with anxiety.  She presents to the emergency  room today with a 1-day history of severe nausea and vomiting that is yellow  in color and mid epigastric pain.  The patient denies having any fever.  The  patient states that her last bowel movement was 2 days ago and was hard but  regular.  The patient was admitted last in May 2005 for gangrenous great  toes; however, also had a history of gastroparesis secondary to diabetes  mellitus with nausea and vomiting that did subside.  The patient also denies  any weight changes.  She states that she has been using her Reglan as  instructed for her gastric reflux.  She has not tried anything for the  nausea and vomiting.  Nothing makes it better or worse.   PAST MEDICAL HISTORY:  As above.  Also:  1. 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  2. 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  3. Coronary artery disease with history of MI in the past x3, followed by     Dr. Algie Coffer.  Ischemic and dilated cardiomyopathy with ejection fraction     of 25% was in October 2002.  4. Hypertension.  5. Secondary hyperthyroidism.  6. COPD with  pulmonary hypertension and asthma.  7. History of depression and anxiety.  8. Gastroesophageal reflux disease.  9. History of CVA without residual symptoms in 1998.  10.      Left rotator cuff tear, full thickness, in December 2003 and left     shoulder bursitis in January 2004.  11.      Severe peripheral vascular disease status post left tibial bypass     by Dr. [name] in November 2004.  12.      Severe bilateral infrainguinal arterial occlusive disease and     severe tibial occlusive disease requiring hospitalization in February     2005.  [facility] admission April 24 through June 20, 2003 with     complaints of nausea secondary to gastroparesis and fecal impaction.  14.  Last admission May 14 to Jul 16, 2003 for a right first toe     gangrene with amputation and positive for MRSA, seen Dr. Darrick Penna.   ALLERGIES:  CODEINE and CAPTOPRIL.   MEDICATIONS:  1. Nephro-Vite one p.o. daily.  2. Insulin 70/30 30 units in the a.m., 20 units in the p.m.  3. Wellbutrin 100 mg b.i.d.  4. Reglan 10 mg a.c. and h.s.  5. Zocor 20 mg q.h.s.  6. Seroquel 100 mg q.h.s.  7. PhosLo 667 mg three a.c. t.i.d.   FAMILY HISTORY:  Noncontributory.   SOCIAL HISTORY:  The patient lives with sister at the present time.  She  denies alcohol or tobacco use.   REVIEW OF SYSTEMS:  See above.  Also, denies fever, denies weight change,  denies headaches, denies dyspnea, denies chest pain, denies constipation,  denies diarrhea.  Positive nausea and vomiting, positive abdominal pain.  Negative dysuria, negative hematuria.   PHYSICAL EXAMINATION:  VITAL SIGNS:  Temperature 98.8, pulse 96,  respirations 26, blood pressure 186/114.  GENERAL:  A 60 year old African-American obese female in mild acute distress  secondary to emesis.  Alert and oriented x3.  The patient is very tearful  today.  HEENT:  Head is atraumatic, normocephalic.  Neck is supple with no JVD.  Eyes:  PERRLA, EOMI.  CHEST:   Clear to auscultation bilaterally.  No rales, rhonchi, or wheezes  noted on exam.  HEART:  Regular rate and rhythm without murmurs.  No clicks, gallops, or  rubs.  ABDOMEN:  Inspection reveals obese, soft abdomen with decreased bowel  sounds.  Tender to palpation mid epigastric region.  No masses,  organomegaly, or rebound tenderness.  NEUROLOGIC:  Cranial nerves II-XII grossly intact, the patient oriented x3.   LABORATORY DATA:  CMP:  Sodium 135, potassium 4.8, chloride 99, BUN 44,  glucose 185, creatinine 9.6, calcium 10.8, total protein 8.5, albumin 3.8,  AST 15, ALT 16, alk phos 58, bilirubin 1.2.  Lipase 40, amylase 306,  phosphorus 4.6.  Acute abdomen series:  Chest x-ray shows cardiomegaly, no  acute disease.  Two-view of the abdomen shows right colonic stool without  evidence of obstruction.   IMPRESSION:  1. Nausea and vomiting.  Suspect gastroparesis with fecal impaction.  Also     note that amylase is increased to 306.  However, lipase is normal at 40.     The patient is nontender in the left upper quadrant.  The patient will be     admitted to 5500.  We will start her out on a clear liquid diet to rest     both the stomach and the colon.  She will be given Phenergan for nausea     and vomiting as well as Reglan a.c. and h.s. IV.  We will also start her     out on sorbitol at 30 mL to slowly relieve constipation.  We will follow     along for observation.  2. Diabetes mellitus.  The patient will be on her regular insulin dose as     well as a sliding scale insulin dosage in the hospital.  We will do CBGs     a.c. and h.s.  3. End-stage renal disease.  The patient will be on hemodialysis.  She will     be dialyzed in the morning.  4. Secondary hyperthyroidism.  The patient will receive Calcijex with     hemodialysis as well as Os-Cal once she starts her regular diet.  Azucena Fallen, PA                        [place] C. Lowell Guitar, M.D.   MY/MEDQ  D:  10/03/2003  T:   10/03/2003  Job:  [id number]

## 2010-07-11 NOTE — Op Note (Signed)
NAME:  [name], [name]                           ACCOUNT NO.:  [id number]   MEDICAL RECORD NO.:  [id number]                   PATIENT TYPE:  INP   LOCATION:  3311                                 FACILITY:  [facility]   PHYSICIAN:  [name]. [name], M.D.        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   OPERATION PERFORMED:  Left femoral to posterior tibial artery bypass graft  with a composite 6 mm PTFE and left greater saphenous vein graft.   SURGEON:  [name]. [name], M.D.   ASSISTANT:  Rowe Clack, P.A.-C.   ANESTHESIA:  General.   INDICATIONS FOR PROCEDURE:  This is a 60 year old woman whom I had been  following with a left great toe wound.  She was not an ideal candidate for  revascularization as she had markedly calcific vessels secondary to her  diabetes and renal insufficiency.  However, the toe pain progressed and it  was felt that the only options would be attempted revascularization versus  below-knee amputation.  She understood that the surgery would be associated  with increased risks given her multiple comorbidities and obesity.  In  addition I was concerned that her distal target posterior tibial artery was  markedly calcified.   DESCRIPTION OF PROCEDURE:  The patient was taken to the operating room and  received a general anesthetic.  Her panus was taped superiorly.  The left  lower extremity and groin were prepped and draped in the usual sterile  fashion.  The posterior tibial artery was identified through a longitudinal  incision along the medial aspect of the distal left leg.  The artery was  markedly calcified but did have a Doppler signal and I thought  that it would  be reasonable to attempt bypass into this.  Therefore, we proceeded with  harvesting the vein.  An oblique incision was made in the left groin to stay  below her inguinal crease and through this incision, the common femoral, the  superficial femoral and deep femoral arteries were dissected free.  Of note,  the bifurcation was fairly high.  Through the same incision, the  saphenofemoral junction was dissected free and using five additional  incisions along the medial aspect of the left leg, the entire greater  saphenous vein was harvested from the groin to the ankle.  Branches were  divided between clips and 3-0 silk ties.  The vein was gently distended up  with heparinized saline and was a little bit small distally and slightly  sclerotic but it was felt to be adequate size.  The saphenofemoral junction  was clamped and the saphenous vein was excised  from the femoral vein.  The  femoral vein was oversewn with a 5-0 Prolene suture.  Next, the patient was  heparinized.  The common femoral, superficial and deep superficial femoral  arteries were controlled and a longitudinal arteriotomy made in the common  femoral artery.  The saphenous vein proximally, the proximal valve was  sharply excised and the vein was sewn end-to-side to the artery using  continuous 6-0 Prolene suture.  Next, using a retrograde Mills valvulotome,  the valves were sharply excised.  Distally the vein was very small and the  intima easily dissected.  I was not satisfied with the quality of the vein  distally and really the majority of the vein was not felt to be usable and  did not distend up as well as I had hoped.  I therefore, elected to leave a  proximal segment of vein which had been sewn to this femoral artery fairly  high up and then take the remaining decent segment to use for the distal  part of the composite to bypass.  The 6 mm PTFE was brought to the field,  spatulated and the vein graft was  divided, leaving approximately 4cm of vein  from the femoral artery to where I anastomosed the 6 mm PTFE graft.  This  was done with continuous 6-0 Prolene suture.  This graft was then brought  through the tunnel and distally the graft was spatulated widely and the vein  graft was used in a nonreversed fashion and spatulated and sewn end-to-end  to the PTFE graft using two continuous 6-0 Prolene sutures.  This was done  in a hand clasp fashion.  The graft was then brought down for anastomosis to  the posterior tibial artery.  A tourniquet was placed on the thigh and the  leg exsanguinated with an Esmarch bandage and the tourniquet inflated to  .  A longitudinal arteriotomy was made in this markedly calcific  artery and then with significant bleeding, we inflated the tourniquet up to  350 but still no control given her markedly calcific vessels.  I attempted  to use flow _________ but because of the disease in the artery, was unable  to pass flow________  .  Therefore, the only remaining option was to clamp  the artery proximally and distally.  This was done with fistula clamps.  The  vein graft was spatulated and then sewn end-to-side to the posterior tibial  artery using a continuous 6-0 Prolene suture.  Prior to complete anastomosis  the clamps were released, the vessel back-bled and flushed and the  anastomosis completed.  At the completion there was a good posterior tibial  artery signal with the Doppler which was graft dependent. Hemostasis was  obtained in the wounds.  I did place a cardiac marker around the proximal  anastomosis.  The incisions were closed with a deep layer of 3-0 Vicryl and  the skin closed with staples.  Once the dressing had been applied, I turned  attention to the amputation of the left great toe.  Of note, because of the  tension on the skin between the incision over the posterior tibial artery and the vein harvest site, I did make multiple stab incisions  to release  this tension.  Once the dressing had been applied, the foot had already been  prepped and draped sterilely and an incision was made encompassing the left  great toe.  The bone was then divided and rongeured back to healthy bone.  Electrocautery  was used to obtain hemostasis and then the wound was  irrigated with copious amounts of antibiotic solution.  The wound was then  closed with interrupted 3-0 nylon sutures.  A sterile dressing was applied,  the patient tolerated the procedure well and was transferred to the recovery  room in satisfactory condition.  All needle and sponge counts were correct.                                               [name]. [name], M.D.    CSD/MEDQ  D:  01/05/2003  T:  01/06/2003  Job:  [id number]

## 2010-07-11 NOTE — Discharge Summary (Signed)
NAMEAKANKSHA, BELLMORE                 ACCOUNT NO.:  [id number]   MEDICAL RECORD NO.:  [id number]          PATIENT TYPE:  INP   LOCATION:  5508                         FACILITY:  [facility]   PHYSICIAN:  Lonia Blood, M.D.      DATE OF BIRTH:  Mar 10, 1950   DATE OF ADMISSION:  04/08/2007  DATE OF DISCHARGE:  04/11/2007                               DISCHARGE SUMMARY   DISCHARGE DIAGNOSES:  1. Acute pancreatitis, resolved.  2. End-stage renal disease, hemodialysis, Tuesdays, Thursdays, and      Saturdays.  3. Type 2 diabetes.  4. Hypertension.  5. Constipation and fecal impaction.  6. Primary peripheral vascular disease.  7. Chronic obstructive pulmonary disease.  8. Coronary artery disease.  9. Obstructive sleep apnea.  10.Sacral decubitus ulcers.   DISCHARGE MEDICATIONS:  1. Lopressor 2.5 mg nightly.  2. Reglan 10 mg t.i.d.  3. Renagel 800 mg t.i.d.  4. Zocor 40 mg nightly.  5. Prilosec 20 mg in the morning.  6. Neurontin 100 mg t.i.d.  7. Wellbutrin 150 mg daily.  8. Nephro-Vite 1 tablet daily.  9. Zyprexa 2.5 mg nightly.  10.Ambien 5 mg nightly.   DISPOSITION:  The patient was discharged in fair health, to follow up  with her dialysis on Tuesdays, Thursdays, and Saturdays, also follow  with her primary care physician accordingly.  She was in fair health at  the time of discharge.   BRIEF HISTORY AND PHYSICAL:  Please refer to dictated history and  physical by Dr. Isidor Holts.  In short, however, the patient is a 60-  year-old female, who has multiple medical problems, who presented with  abdominal pain and vomiting for x1 day.  She also has pain and open  ulcer that seems to be infected at the left leg stump secondary to MRSA.  The patient complained of inability to keep anything down for 24 hours.  The pain was severe and rated as 6-7/10, and persistent in the mid  abdomen and worsen with meals.  Her lipase on admission was found to be  elevated at 111.  Her creatinine  was 4.93.  Chest x-ray showed possible  fecal impaction on the abdominal view.  Otherwise, her numbers were  unimpressive.  She was subsequently admitted with diagnoses of  pancreatitis and end-stage renal disease.   HOSPITAL COURSE:  1. Acute pancreatitis.  The cause was not clear.  This could be      related to her medications, but could also be related to gallstone,      which were never found.  The patient was subsequently treated with      bowel rest, hydration, and pain control as well as nausea and      vomiting control.  She responds to the treatment, and at the time      of discharge, she was told to resume her fluid diet.  2. Peripheral vascular disease with gangrene.  Orthopedics was      consulted for sake of the patient.  She was evaluated and she is to      have recurrent avascular.  No intervention was warranted at this      time, otherwise, wound care was only recommended and she received      that in the hospital.  3. End-stage renal disease.  The patient continued to have dialysis on      Tuesdays, Thursdays, and Saturdays in the hospital at the time of      discharge, will resume at this house center outpatient dialysis.  4. Hypertension.  This was also fully controlled during this      hospitalization.   Other medical problems were all stable during her brief stay in the  hospital and we have managed according to her home regimen and will  continue to have the management at home.      Lonia Blood, M.D.  Electronically Signed     LG/MEDQ  D:  06/16/2007  T:  06/17/2007  Job:  [id number]

## 2010-07-11 NOTE — Op Note (Signed)
[place]  Patient:    [name], [name] Visit Number: [id number] MRN: [id number]          Service Type: MED Location: [phone] Attending Physician:  Ricki Rodriguez Dictated by:   Caralee Ates, M.D. Proc. Date: 12/23/00 Admit Date:  12/17/2000                             Operative Report  PREOPERATIVE DIAGNOSIS:  End-stage renal disease.  POSTOPERATIVE DIAGNOSIS:  End-stage renal disease.  PROCEDURE:  Left forearm arteriovenous graft (6 mm polytetrafluoroethylene).  SURGEON:  Caralee Ates, M.D.  ASSISTANT:  Beather Arbour. Thomasena Edis, M.D.  ANESTHESIA:  MAC plus 1% lidocaine as local.  ESTIMATED BLOOD LOSS:  20 cc.  DRAINS:  None.  SPECIMENS:  None.  COMPLICATIONS:  None.  BRIEF HISTORY:  This patient is a 60 year old black female with end-stage renal disease who is currently dialyzed through an Ash catheter.  She is in need of long-term AV access for her dialysis needs.  She does not have adequate superficial veins for creation of a fistula; therefore, an AV graft was planned.  DESCRIPTION OF PROCEDURE:  The patient was brought to the operating room and placed on the operating table in supine position.  Following adequate IV sedation, the left arm was draped and prepped in a sterile fashion.  The area around the anterior forearm was anesthetized with 1% lidocaine and a small transverse incision was made just below the elbow at the antecubital fossa. The wound was deepened using the Bovie to control bleeding.  A large-caliber basilic vein was identified medially in the wound, and this was encircled with vessel loops proximally and distally.  Next, dissection was carried down to the level of the brachial artery, which was dissected circumferentially and encircled with a vessel loop proximally and distally.  Next, an area distally on the forearm was anesthetized with 1% lidocaine and a small transverse counter incision was made.  A 6 mm  PTFE graft was then passed in the subcutaneous plane with a Gore tunneler using the counter incision.  The patient was then systemically heparinized.  Following adequate three-minute circulation time, the vessel loops around the brachial artery were tightened to occlude flow.  A longitudinal arteriotomy was performed.  The graft was spatulated and then sewn in an end-to-side fashion with running 6-0 PTFE suture.  Following completion of the anastomosis, a clamp was placed on the graft and the vessel loops around the brachial artery were released, restoring flow.  There was some bleeding from the anastomotic suture line that was controlled with a single interrupted repair stitch.  Next, the vessel loops around the basilic vein were tightened to occlude flow.  A longitudinal venotomy was performed.  The PTFE graft was then trimmed to the appropriate length and spatulated.  An end-to-side anastomosis was created with a running 6-0 PTFE suture.  Prior to completion of the anastomosis, the vein was backbled and the graft was flushed.  The anastomosis was then completed. Vessel loops were released, as was the clamp on the graft, and flow was established in the AV graft.  There was an excellent thrill in the graft and along the venous outflow tract.  A palpable radial pulse was also noted.  The patient was then administered 20 mg of protamine and hemostasis was achieved. The wound was then irrigated copiously with warm sterile saline and then closed in  layers with 3-0 and 4-0 Vicryl suture.  Sterile dry dressings were applied.  The patient was then awakened from anesthesia and transferred to the recovery room in stable condition.  The patient tolerated the procedure well. No complications.  All needle and sponge counts were correct. Dictated by:   Caralee Ates, M.D. Attending Physician:  Ricki Rodriguez DD:  12/23/00 TD:  12/24/00 Job: [id number]

## 2010-07-11 NOTE — Discharge Summary (Signed)
NAME:  [name], [name]                           ACCOUNT NO.:  [id number]   MEDICAL RECORD NO.:  [id number]                   PATIENT TYPE:  INP   LOCATION:  5501                                 FACILITY:  [facility]   PHYSICIAN:  [name]. [name], M.D.        DATE OF BIRTH:  March 15, 1950   DATE OF ADMISSION:  01/05/2003  DATE OF DISCHARGE:  01/15/2003                                 DISCHARGE SUMMARY   ADMISSION DIAGNOSIS:  Limb-threatening ischemia of the left foot.   PAST MEDICAL HISTORY:  1. 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  2. Coronary artery disease status post myocardial infarction in 1989. Also     history of congestive heart failure.  3. Hypertension.  4. Diabetes mellitus type 1.  5. End-stage renal disease on hemodialysis, Tuesday, Thursday, Saturday     schedule at [facility] via left forearm AV graft.   ALLERGIES:  Allergy to CAPTOPRIL, unknown reaction. Also allergic to CODEINE  and COATED ASPIRIN, each causes itching.   DISCHARGE DIAGNOSES:  1. Limb-threatening ischemia of the left lower extremity status post left     femoral to posterior tibial bypass.  2. 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   BRIEF HISTORY:  [name] is a 60 year old female who was referred to Dr.  [name] from Dr. Arrie Aran for evaluation of a nonhealing left great toe  wound. She was originally evaluated with Dr. [name] in September 2004. At  that time, had evidence of SFA and tibial occlusive disease. He felt that in  the setting of her diabetes and end-stage renal disease this most likely  progress to a limb-threatening problem. He recommended proceeding with an  arteriogram which was performed on September 3. There was diffuse  superficial femoral artery and tibial occlusive disease bilaterally with  markedly calcified vessels. There is a potential target in the left  posterior tibial; however, she is not an ideal candidate for  revascularization.  It was elected to observe, and she returned to the office  on September 26. The left great toe was then with dry gangrene. There was no  sign of an infection. Dr. [name] discussed with [name] her options  regarding revascularization versus below-knee amputation versus continued  observation. She elected again observation. Two weeks later, the wounds were  unchanged. She returned to the office in early November with significant  pain in her left foot. Dr. [name] again discussed her options, and [name]  then wished to proceed with revascularization. Dr. [name] had a very frank  discussion with [name] regarding the risks and benefits of this procedure  including a likelihood of a 50/50 chance of successful limb salvage.   HOSPITAL COURSE:  On January 05, 2003, [name] was admitted to [facility] under the care of Dr. [name]. She underwent the following surgical  procedure:  1. Left femoral to posterior tibial bypass with Composite saphenous  vein     graft and 6-mm Gortex graft.  2. Primary left great toe amputation.   She tolerated the procedure well, transferring in stable condition to the  PACU.   Renal service was consulted immediately following surgery for assistance  with inpatient hemodialysis as well as general medical care during this  hospitalization. The renal service did follow [name] throughout her  hospitalization.   [name] made fair progress in recovering from her surgery. She did have  some significant pain issues at her great toe amputation site. Her leg graft  remained palpable, and her incisions healed well; however, her toe  amputation site did not heal well. This was despite frequent dressing  changes and hydrotherapy. By November 16 postoperative day #4, Dr. Adele Dan  evaluation included good graft pulse, but her toe amputation site looked  marginal. He felt that if it did not improve her options are for ray  amputation of the toe versus  BKA. Dressing changes were continued. Ms. Kitt  continued to have significant pain with this foot. She did begin working  with physical therapy. She was partial weight bearing with a Darco boot on  her left foot. She was started on a Dilaudid PCA and has helped with pain  from her foot. On November 18, postoperative day #6 morning evaluation, Ms.  [name] asked that Dr. [name] please amputate the rest of my toe today. Dr.  [name] did bring [name] back to the operating room on the afternoon of  November 18 and performed a ray amputation of her left great toe. Again, she  tolerated this procedure well and remained stable in the immediate  postoperative period.   Over the next several days, [name] continued to make progress recovering  from her surgery. Her graft remained patent. Her incisions are healing well,  and her ray amputation site was also beginning to heal well. Dr. [name]  still was concerned that she ultimately would come to a below-knee  amputation; however, he felt that it was worth giving this amputation site  every benefit of the doubt to heal.   [name] was evaluated by the physical medicine rehab service here at [facility] and was found to be an appropriate candidate for that level of  care. A bed became available for her on January 15, 2003, and as she was  medically stable, she was transferred to the rehab service.   INSTRUCTIONS ON DISCHARGE:  She was to continue with her same medications as  on acute care side. She will also continue inpatient hemodialysis while on  the rehab service. Dressing changes on her left foot were to continue, and  that should include Hydrogel to the wound followed by wet-to-dry 4 x 4  saline soaked dressing, wrapped with Kerlix and ACE dressing changes to be  performed b.i.d. She is also to continue working with physical therapy and  occupational therapy as per their recommendations. Dr. [name] planned to follow her progress  while on the rehab service. Any outpatient followup with  Dr. [name] will be arranged prior to her discharge from the rehab service.      Toribio Harbour, N.P.                  [name]. [name], M.D.    CTK/MEDQ  D:  04/17/2003  T:  04/18/2003  Job:  [id number]

## 2010-07-11 NOTE — Op Note (Signed)
NAME:  [name], [name]                           ACCOUNT NO.:  [id number]   MEDICAL RECORD NO.:  [id number]                   PATIENT TYPE:  INP   LOCATION:  5524                                 FACILITY:  [facility]   PHYSICIAN:  Janetta Hora. Fields, MD               DATE OF BIRTH:  02-02-1951   DATE OF PROCEDURE:  07/11/2003  DATE OF DISCHARGE:                                 OPERATIVE REPORT   PREOPERATIVE DIAGNOSIS:  Gangrene, first toe.   POSTOPERATIVE DIAGNOSIS:  Gangrene, first toe.   PROCEDURE:  Amputation of right first toe.   ANESTHESIA:  Local with IV sedation.   ASSISTANT:  Nurse.   FINDINGS:  Gangrene, tip of right first toe.   OPERATIVE DETAILS:  After obtaining informed consent, the patient was taken  to the operating room.  An ankle block was placed by the anesthesia service.  The patient still had some pain on testing the block, so this was  supplemented with local anesthesia and nitrous oxide during the case.  The  patient's right foot was prepped and draped in the usual sterile fashion.  Local anesthesia was infiltrated as a digital block in the first web space  on the lateral aspect of the toe.  Next a circumferential incision was made  over the distal phalanx.  This as divided with a bone cutter.  This was  debrided back to approximately one-third of the way back of the proximal  phalanx.  This was thoroughly irrigated with normal saline solution.  The  tissues were bleeding at this level.  There was no evidence of abscess.  The  skin edges were then partially reapproximated with interrupted nylon  sutures.  The patient tolerated the procedure well, and there were no  complications.  Instrument, sponge, and needle count was correct at the end  of the case.  The patient was taken to the recovery room in stable  condition.                                               Janetta Hora. Fields, MD    CEF/MEDQ  D:  07/11/2003  T:  07/11/2003  Job:  [id number]

## 2010-07-11 NOTE — Op Note (Signed)
   NAME:  [name], [name]                           ACCOUNT NO.:  [id number]   MEDICAL RECORD NO.:  [id number]                   PATIENT TYPE:  OIB   LOCATION:  2899                                 FACILITY:  [facility]   PHYSICIAN:  Quita Skye. Hart Rochester, M.D.               DATE OF BIRTH:  Feb 12, 1951   DATE OF PROCEDURE:  12/29/2001  DATE OF DISCHARGE:  12/29/2001                                 OPERATIVE REPORT   PREOPERATIVE DIAGNOSIS:  End-stage renal disease with thrombosed  arteriovenous Gore-Tex graft, left arm.   POSTOPERATIVE DIAGNOSIS:  End-stage renal disease with thrombosed  arteriovenous Gore-Tex graft, left arm.   OPERATION:  Thrombectomy of arteriovenous Gore-Tex graft, left forearm, with  insertion of new segment of graft in existing graft to basilic vein.   SURGEON:  Quita Skye. Hart Rochester, M.D.   FIRST ASSISTANT:  Nurse.   ANESTHESIA:  Local.   DESCRIPTION OF PROCEDURE:  The patient was taken to the operating room and  placed in a supine position, at which time the left upper extremity was  prepped with Betadine scrubbing solution and draped in routine sterile  manner.  After infiltration with 1% Xylocaine, a longitudinal incision was  made through the antecubital area and the Gore-Tex to the antecubital vein  dissected free.  The basilic branch had sclerosed and was essentially  totally occluded.  The graft itself was opened in a transverse graftotomy  after 3000 units of heparin were given intravenous.  The graft was easily  thrombectomized with excellent inflow being reestablished. The vein was  traced proximally about 6 cm, where it became much better, and at this  point, a deep branch converged and it was 5 mm in size.  A new piece of 6-mm  graft was then anastomosed end-to-end to the old graft and end-to-end to the  proximal vein with 6-0 Prolene.  Clamp was then released and there was  excellent pulse and thrill in the graft.  No protamine was given.  Wound was  irrigated  with saline, closed in layers with Vicryl in a subcuticular  fashion and with Steri-Strips, sterile dressing applied and patient taken to  the recovery room in satisfactory condition.                                               Quita Skye Hart Rochester, M.D.    JDL/MEDQ  D:  12/29/2001  T:  12/30/2001  Job:  [id number]

## 2010-07-11 NOTE — Discharge Summary (Signed)
[place]  Patient:    [name], [name]                        MRN: [id number] Adm. Date:  [id number] Disc. Date: [id number] Attending:  Madaline [name] Dictator:   [name], M.D. CC:         [name], M.D.   Discharge Summary  DATE OF BIRTH:  1950/08/23  DISCHARGE DIAGNOSES:  1. Congestive heart failure.  2. Diabetic cardiomyopathy.  3. Chronic obstructive pulmonary disease with asthma     exacerbation.  4. Pericardial effusion.  5. Diabetes mellitus, type 2.  6. Chronic renal insufficiency with nephrosclerosis.  7. Gastroparesis secondary to diabetes mellitus.  8. Coronary artery disease with history of myocardial infarction.  9. Hyperlipidemia.  DISCHARGE MEDICATIONS:  1. Lasix 80 mg one p.o. q.d.  2. Lotensin 10 mg three p.o. q.d.  3. Flovent 110 mcg metered dose inhaler two puffs b.i.d.  4. Serevent metered dose inhaler with spacer two puffs b.i.d.  5. Norvasc 10 mg one p.o. q.d.  6. Digoxin 0.125 mg one p.o. q.d.  7. Reglan 10 mg two p.o. q.a.c. t.i.d.  8. Aspirin one p.o. q.d.  9. Pepcid 20 mg one p.o. q.d. 10. Insulin NPH 20 units subcu in the a.m., 4 units subcu     q.h.s. 11. Nephro-Vite one pill daily. 12. Doxazosin 2 mg one p.o. q.h.s. 13. Albuterol metered dose inhaler with spacer two puffs q.6h.     p.r.n. wheezing.  DISCHARGE INSTRUCTIONS:  The patient should follow a sodium restricted diet with fluid restriction to one liter per day.  She is instructed to use a spacer with all of her metered dose inhalers.  She is to check her peak flow meters twice daily in the morning and evening and record on the chart given to her.  She should bring the chart with her to follow-up appointment with Dr. Caffie [name].  FOLLOW-UP APPOINTMENTS: 1. Two weeks with [name], M.D.  Clinic phone number [phone]. 2. She has an appointment with Dr. Caffie [name], internal medicine, for    June 09, 2000, at which time her weight and  basic metabolic panel    should be checked.  She needs a thorough lung exam at that time to    assess the adequacy of her Lasix regimen and pulmonary edema.  PROCEDURES: 1. May 28, 2000, Adenosine Cardiolite results showing no evidence of    pharmacologic or resting ischemia.    Ejection fraction is 20-30% with increased pulmonary arterial pressure. 2. May 27, 2000, transthoracic echocardiogram with the following summary:    Moderately evaluated left ventricular.  Overall left ventricular systolic    function markedly decreased with an ejection systolic fraction of 20-30%.    Diffuse left ventricular hypokinesis.  Mild perivascular motion of the    intraventricular septum consistent with conduction abnormality with paced    rhythm, moderately dilated left atrium, moderate pulmonary hypertension,    moderate tricuspid valvular regurgitation and small free flowing    echo free pericardial effusion circumferential to the heart.  CONSULTANTS:  [name], M.D., cardiology.  HISTORY OF PRESENT ILLNESS:  [name] is a 60 year old African-American female who presented to the ED with increasing shortness of breath, pedal edema and nonproductive cough for the last two months since her last discharge from the hospital.  She states she has had trouble getting an appointment [facility]  for the last six weeks and was unable to get one prior to admission.  She reports complaints with her medications and is taking 40 mg of Lasix q.d.  She reports continued paroxysmal nocturnal dyspnea, orthopnea and increased shortness of breath.  She states she can only walk 1/2 block compared to a full block two months ago secondary to shortness of breath and fatigue.  Her cough is nonproductive.  She denies chest pain or palpitations and has had no nasal congestion, rhinorrhea or headaches.  Review of systems is otherwise negative.  MEDICATIONS:  (Prior to admission).  1.  Digoxin 0.125 mg p.o. q.d.  2. Pepcid 20 mg p.o. q.d.  3. Lotensin 20 mg p.o. q.d.  4. Metoclopramide 20 mg p.o. q.d.  5. Norvasc 10 mg p.o. q.d.  6. Nephro-Vite one tablet p.o. q.d.  7. Doxazosin 2 mg q.d.  8. NPH Insulin 20 units in the morning, 4 units in the     evening.  9. Flovent metered dose inhalers. 10. Serevent metered dose inhaler. 11. Enteric-coated aspirin one daily. 12. Lasix 40 mg p.o. q.d.  ALLERGIES:  ASPIRIN, CAPTOPRIL, CODEINE.  PHYSICAL EXAMINATION:  VITAL SIGNS:  Temperature 98.1, pulse 95, blood pressure 216/96, respiratory rate 14, saturation 85% on room air.  GENERAL:  This patient is an overweight African-American female in mild to moderate respiratory distress with difficulty speaking complete sentences.  LUNG:  Her lung exam showed poor lung exchange with no wheezing or crackles.  CARDIOVASCULAR:  Exam showed regular rate and rhythm with no murmurs, rubs or gallops.  EXTREMITIES:  The extremities show 2+ pitting edema bilaterally.  HOSPITAL COURSE: #1 - DYSPNEA:  Given her history of congestive heart failure and obvious pedal edema, it was presumed that she was on suboptimal therapy for diuresis and was congestive heart failure.  She was provided with a chest x-ray to rule out an infiltrate and started on oxygen and IV Lasix for diuresis.  She was ruled out for an acute MI with serial enzymes and EKGs.  As she had not had a Cardiolite in several years, she underwent evaluation by cardiology for evaluation of her systolic function.  Following several days of IV Lasix diuresis, the patient dropped 3-5 liters per day with a total weight loss of 26 pounds and roughly 14 liters.  Because her chest x-rays persistently showed infiltrate versus edema, she had a chest CT to rule out other causes of the pattern seen on chest x-ray.  A chest CT did not show evidence of mediastinal adenopathy or infiltrate that was consistent with interstitial pulmonary  edema.  She was  diuresed an additional day and discharged on 80 mg of Lasix q.d.  Upon follow-up she will need a good lung exam as well as a recheck of her weight.  #2 - CORONARY ARTERY DISEASE STATUS POST MYOCARDIAL INFARCTION:  The patient underwent cardiac evaluation with an Adenosine Cardiolite which ruled out evidence of pharmacologic or resting ischemia.   She was continued on her digoxin and her Lotensin dose was increased to 30 mg a day from 20 mg a day. She will need her potassium rechecked upon follow-up given her chronic renal insufficiency.  She was started initially on carvedilol but that was held for her cardiac tests.  She should be restarted on low dose carvedilol to maximize her cardiac performance.  If she develops bronchospasm in the outpatient setting secondary to the beta blocker then it will be discontinued.  #3 - CHRONIC RENAL INSUFFICIENCY:  The patients BUN and creatinine were 46 and 3.0 on admission.  It was slightly elevated from her baseline of 2.2 to 2.7.  She was aggressively diuresed without any Lasix, and her creatinine peaked at 1.3 to 1.6.  Her IV Lasix was held overnight, and creatinine improved to 3.2.  She was discharged on 80 mg of Lasix q.d., and will need her creatinine checked on follow-up.  Her potassium was normal and stable during her hospitalization.  #4 - ASTHMA:  The patient was continued on albuterol nebulizers until her dyspnea improved.  Then she was switched to metered dose inhalers and instructed how to use them with spaces and how to monitor her peak flows daily.  She should bring her charts with peak flows with her when she follows up with Dr. Caffie [name] in the clinic.  #5 - HEMATURIA:  The patient had transient hematuria while in the hospital, most likely secondary to Lovenox.  Her hematuria resolved when Lovenox was discontinued.  However, she should have a urinalysis checked in the clinic setting to look for any recurrence of  hematuria.  #6 - DIABETES MELLITUS:  The patients blood sugars were recently well-controlled on her home regimen of her NPH insulin.  No changes were made to her medications.  #7 - HYPERLIPIDEMIA:  The patients fasting lipid panel was checked in the hospital and noted to be elevated with a total cholesterol of 273, LDL of 193 and HDL of 71.  She should be started on Zocor as an outpatient given her cardiac disease.  #8 - PERICARDIAL EFFUSION:  Small pericardial effusion was noted on the patients 2-D echo and was confirmed on the high resolution CT scan of her chest.  There is no evidence of tamponade, so no action was taken at this time.  DISCHARGE LABORATORY DATA:  The patients pertinent labs at discharge are as follows.  Her _____  was 243.6 which was down from admission lab of 270.4. Sodium 140, potassium 4.2, chloride 107, bicarbonate 27, BUN 40, creatinine 3.3.  Pre and post peak flows were 370 and 400.  The patient was saturating 93% on room air pre and post ambulation with no changes. DD:  06/06/00 TD:  06/06/00 Job: [id number]

## 2010-07-11 NOTE — Discharge Summary (Signed)
NAME:  TAMIAH, DYSART                           ACCOUNT NO.:  [id number]   MEDICAL RECORD NO.:  [id number]                   PATIENT TYPE:  IPS   LOCATION:  4153                                 FACILITY:  [facility]   PHYSICIAN:  Erick Colace, M.D.           DATE OF BIRTH:  [birth year]   DATE OF ADMISSION:  04/05/2003  DATE OF DISCHARGE:  04/13/2003                                 DISCHARGE SUMMARY   DISCHARGE DIAGNOSES:  1. Peripheral vascular disease.  2. Status post right femoral-popliteal bypass graft.  3. Left toe Klebsiella infection with colonization.  4. Diabetes mellitus.  5. End-stage renal disease.  6. Necrotic changes, right great toe.   HISTORY OF PRESENT ILLNESS:  Ms. Doffing is a 60 year old female with history  of diabetes mellitus, end-stage renal disease, peripheral vascular disease,  left femoral-popliteal bypass graft, right lower extremity ischemia and  pain.  She elected to undergo right femoral-popliteal bypass graft February  1 by Dr.  [name] on IV heparin initially.  Past surgery postop ABI  difficult to ascertain secondary to calcified right blood vessels.  She has  had __________for decrease in hemoglobin and hematocrit.  Open wound left  great toe site continues with dressing changes.  As patient with fever,  wound was cultured and was noted to be positive for Enterococcus and  Klebsiella.  She was on Zosyn initially, changed to Unasyn on February 6  with recommendation to change to Cipro which was done on February 7.  Therapy initiated and patient currently minimum assistance bed mobility,  total assistance to 80% to transfer, total assistance 90% ambulate 75 feet x  2 with increased dyspnea on activity.   PAST MEDICAL HISTORY:  See Discharge Diagnoses plus history of anemia of  chronic disease, prior CVA, left A-V graft with thrombectomy, ___________  left shoulder bursitis, history of MI, CHF, pulmonary hypertension, COPD,  depression, GERD.   ALLERGIES:  CAPOTEN and CODEINE.   SOCIAL HISTORY:  The patient lives with mother in one-level home. She was  independent prior to admission.  She did not use any tobacco or alcohol.   HOSPITAL COURSE:  Ms. Shetara Launer was admitted to rehab on April 05, 2003, for inpatient therapy to consist of PT, OT daily.  Past admission, the  patient was maintained on Cipro throughout her stay.  Hemodialysis was  continued on Tuesday, Thursday, and Saturday schedule.  Pain control was  reasonable with p.r.n. medications.  Check on b.i.d. basis ranged from 120s  to 140s systolic, 50 to 70 diastolic.  Blood sugars checked on a.c. and h.s.  basis.  Insulin has slowly been adjusted for tighter control.  At time of  discharge, blood sugars are running 90s to 120s.  P.O. intake has been good.  She has had complaints of increasing neuropathy,and Neurontin was increased  200 mg b.i.d.  The patient's right lower extremity has healed well.  No  signs or symptoms of infection or drainage.  She does continue to 1 to 2 +  edema.  Staples were discontinued on April 12, 2003, without difficulty.  The patient's graft shows a good pulse at time of discharge.  She was noted  to have necrotic right great toe that shows some dry gangrene.  Darco boot  was ordered to keep pressure relief off that toe.   At time if discharge, the patient had completed 11 total days of Cipro.  She  is to follow up with Dr. [name] in a week for recheck of wound and also for  decision regarding total duration of antibiotics.  The patient is to  continue on Cipro until her followup in one week.  During her stay in rehab,  Ms. Foxworthy progressed to being modified independent for transfers, modified  independent for ambulating 150 feet, and partial weightbearing on left lower  extremity.  She was modified independent for navigating stairs.  The patient  was modified independent bathing at the sink, modified independent for  dressing,  modified independent for toileting.  She will continue with home  health OT and PT by [facility] with home health RN to follow on  wound monitoring.  On April 13, 2003, the patient is discharged to home.   DISCHARGE MEDICATIONS:  1. Tylenol 325 mg 2 p.o. four times a day  2. Cipro 500 mg q.h.s.  3. Flovent 110 mg 2 puffs b.i.d.  4. Nephro-Vite 1 per day.  5. Ativan 1mg  b.i.d.  6. Serevent 50 mcg 1 puff b.i.d.  7. Seroquel 100 mg q.h.s.  8. Colace 325 mg daily.  9. Wellbutrin 100 mg b.i.d.  10.      Zocor 20 mg q.h.s.  11.      Reglan 10 mg t.i.d.  12.      Calcijex 0.75 mcg IV on Tuesday, Thursday, and Saturday.  13.      Epogen 4500 units IV on Tuesday, Thursday, and Saturday.  14.      PhosLo 667 mg p.o. t.i.d.  15.      Neurontin 300 mg q.h.s.  16.      Insulin 30 units q.a.m., 22 units q.p.m.  17.      Oxycodone (IR) 5 to 10 mg q.4-6h. p.r.n. pain.  18.      InFeD 100 mg IV each Sunday with hemodialysis.   ACTIVITY:  Use walker.  Darco boots on bilateral feet.   DIET:  Renal diet with ADA restrictions, 1200 ml fluid restriction.   WOUND CARE:  Damp-to-dry dressing left foot b.i.d.   SPECIAL INSTRUCTIONS:  No alcohol, no smoking, no driving.   FOLLOW UP:  The patient is to follow up with Dr. [name] for postop check of  February 23 at 2:30 p.m.  Follow up with Dr. Wynn Banker as needed.      Greg Cutter, P.A.                    Erick Colace, M.D.    PP/MEDQ  D:  04/26/2003  T:  04/27/2003  Job:  [id number]   cc:   [name]. [name], M.D.  [address]  [place]  [place] [address]   Ricki Rodriguez, M.D.  [address]  [place] [address]   [facility]

## 2010-07-11 NOTE — H&P (Signed)
NAME:  [name], [name]                           ACCOUNT NO.:  [id number]   MEDICAL RECORD NO.:  [id number]                   PATIENT TYPE:  INP   LOCATION:  5524                                 FACILITY:  [facility]   PHYSICIAN:  Maree Krabbe, M.D.             DATE OF BIRTH:  [birth year]   DATE OF ADMISSION:  07/07/2003  DATE OF DISCHARGE:                                HISTORY & PHYSICAL   REASON FOR ADMISSION:  Nausea and vomiting.   HISTORY OF PRESENT ILLNESS:  A 60 year old black female with end-stage renal  disease secondary to diabetic nephropathy on chronic dialysis on Tuesday,  Thursday, and Saturday at the [facility] presents to the  emergency room with a 24-hour history of nausea, vomiting, and epigastric  abdominal pain. She denies hematemesis, coffee-ground emesis, diarrhea,  constipation, fever, chills, shortness of breath, cough, URI, or dysuria.  She has a history of gastroesophageal reflux disease and probable  gastroparesis. She had a recent admission three weeks ago with similar  symptoms and was treated with increased Reglan and was found to have fecal  impaction and that was also treated.   The patient has peripheral vascular disease and is status post left great  toe amputation. She has a necrotic right great toe and has home health  nurses coming in doing daily dressing changes to bilateral feet. In the  emergency room the wounds are malodorous without even being addressed. She  complains of right great toe tenderness. She is being admitted now for GI  rest, IV Reglan, and rule out osteo of the necrotic toe.   ALLERGIES:  CODEINE, CAPTOPRIL, AND ASPIRIN.   CURRENT MEDICATIONS:  1. Phos-Lo 667 mg three with each meals t.i.d.  2. Seroquel 100 mg q.h.s.  3. Zocor 20 mg q.h.s.  4. Reglan 10 mg a.c. and h.s.  5. Wellbutrin 100 mg b.i.d.  6. Insulin 70/30, 30 units in the morning and 20 units in the evening.  7. INFeD 100 mg IV every Thursday.   PAST MEDICAL HISTORY:  1. 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  2. Insulin-dependent diabetes mellitus with neuropathy, gastropathy, and     retinopathy.  3. Coronary artery disease with history of MI in the past, followed by Dr.     Algie Coffer.  4. Cardiac ischemia and dilated cardiomyopathy with an ejection fraction of     25% with hypokinesis of the posterior-inferior left ventricular walls on     echo.  5. Hypertension.  6. Iron-deficiency anemia.  7. Secondary hyperparathyroidism.  8. Chronic obstructive pulmonary disease.  9. Pulmonary hypertension.  10.      Asthma.  11.      Depression and anxiety.  12.      Gastroesophageal reflux disease.  13.      History of cerebrovascular accident without residual symptoms in  1998.  14.      Left ankle fracture in July 2003, Dr. Thomasena Edis.  15.      Left rotator cuff tear, full thickness, December 2003, with left     shoulder bursitis in January 2004, requiring steroid injections, Dr.     Thomasena Edis.  16.      Severe peripheral vascular disease, status post left tibial bypass,     Dr. Durwin Nora in November 2004.  17.      Status post left great toe amputation, Dr. Durwin Nora, in November 2004.  18.      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  19.      Nausea, vomiting, gastroparesis, and fecal impaction requiring     hospitalization in April 2005.   FAMILY HISTORY:  Positive for diabetes, heart disease, and strokes.   SOCIAL HISTORY:  She lives in [place] with her mother. She denies  tobacco, alcohol, or drugs. She is on disability.   PHYSICAL EXAMINATION:  VITAL SIGNS: On admission, blood pressure 206/106,  pulse 82 and regular, respirations 20, oxygen saturation 93% on room air.  Temperature 97.8.  GENERAL: A well-developed, obese black female who is anxious and  tearful,  oriented times three.  HEENT: Normocephalic and atraumatic. Pupils equal, round, and reactive to  light. No facial edema.  NECK: Supple. No JVD.  LUNGS: Grossly clear.  HEART: Regular rate and rhythm with a grade 1/6 systolic murmur in the left  sternal border.  ABDOMEN: Obese, positive bowel sounds, soft. Moderate epigastric tenderness.  No masses or rebound.  RECTAL EXAM: Brown stool, heme negative.  EXTREMITIES: Left great toe amputation site with a drop of pus at the  proximal wound. The right great toe is very malodorous, black, and necrotic  with fluctuance of the dorsal surface and exquisitely tender at the point.  There is no pus expressible. The left arm AV Gore-Tex graft has a bruit. No  lower extremity edema.  NEUROLOGIC: Grossly intact.   LABORATORY DATA:  White count 10,800, hemoglobin 14.7, platelet count  355,000. Sodium 137, potassium 6.2, chloride 89, CO2 33, BUN 42, creatinine  8.9, glucose 154, calcium 10.0.  Chest x-ray and KUB reveal no active  disease.   ASSESSMENT/PLAN:  1. Nausea and vomiting secondary to gastroparesis versus gastroesophageal     reflux disease versus viral. Will admit, provide clear liquids, and treat     with IV Reglan.  2. Necrotic right great toe, which is very malodorous. Will culture, check x-     rays to osteo, and have Dr. Durwin Nora see her.  3. Insulin-dependent diabetes mellitus.  Decrease 70/30 insulin for now.  4. Peripheral vascular disease, status post left great toe amputation. With     purulent drainage, have culture.  5. Anxiety and depression. Will use Wellbutrin.  6. Anemia. EPO on hold for elevated hemoglobin.  7. End-stage renal disease with hyperkalemia. Will do dialysis now.      Zenovia Jordan, P.A.                      Maree Krabbe, M.D.    RRK/MEDQ  D:  07/07/2003  T:  07/07/2003  Job:  [id number]   cc:   [facility]

## 2010-07-11 NOTE — H&P (Signed)
NAME:  [name], [name]                           ACCOUNT NO.:  [id number]   MEDICAL RECORD NO.:  [id number]                   PATIENT TYPE:  INP   LOCATION:  NA                                   FACILITY:  [facility]   PHYSICIAN:  [name]. [name], M.D.        DATE OF BIRTH:  1950/07/28   DATE OF ADMISSION:  DATE OF DISCHARGE:                                HISTORY & PHYSICAL   She receives her primary care through [facility]. Nephrologist  is Dr. Terrial Rhodes. Her cardiologist is Dr. Algie Coffer.   HISTORY OF PRESENT ILLNESS:  This is a 60 year old African-American female  referred from Dr. Arrie Aran for evaluation of nonhealing left great toe  wound. She developed a wound in her left great toenail bed approximately in  August 2004. There is no history of injury, and there has been no  significant healing since that time. She has no prior history of nonhealing  wounds. She has no complaints of claudication. She was evaluated by Dr.  [name] at [facility] on September 1. 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 These findings along with her diabetes  and end-stage renal disease he felt were most likely progress this wound to  limb-threatening problem. He recommended proceeding with arteriogram. This  was performed on November 3 and revealed diffuse SFA and tibial occlusive  disease bilaterally with markedly calcified vessels. The only potential  target for bypass was a left posterior tibial artery. Dr. [name] felt she  was not an ideal candidate for revascularization. [name] and Dr. [name]  elected to observe her foot, and she returned to the office on September 22.  At that time, her left great toe was with evidence of dry gangrene. There  was no signs of infection. Dr. [name] discussed with [name] options  including revascularization versus primary below-knee amputation. [name]  was not prepared to proceed with either of  these procedures at that time,  and they elected to again observe for two more weeks. She will return to the  office on October 13. Her left great toe wound was essentially unchanged;  however, she was now with significant pain in her left foot and leg. Dr.  [name] and [name] again discussed the available options, and [name]  wishes to proceed at this time with attempted revascularization. Risks and  benefits of the procedure were discussed including a 50/50 chance of  successful limb salvage.   PAST MEDICAL HISTORY:  1. CVA in 1989. No residual deficits.  2. Coronary artery disease with MI 1989. Also has a history of CHF.  3. Hypertension.  4. Diabetes mellitus type 1.  5. End-stage renal disease. She receives hemodialysis on Tuesday, Thursday,     Saturday schedule via left forearm AV graft. This was done at the     [facility] in [place].   ALLERGIES:  CAPTOPRIL. She is unsure of the reaction. Also CODEINE and  COATED ASPIRIN. This both cause itching.   MEDICATIONS PRIOR TO ADMISSION:  1. Insulin 70/30 30 units in the a.m., 20 units in the p.m.  2. Bupropion 200 mg q.d.  3. Zocor 20 mg q.d.  4. PhosLo gelcaps 667 mg #3 b.i.d.  5. Metoclopramide 20 mg before meals.  6. Lorazepam 1 mg b.i.d.  7. Cephalexin 200 mg q.d.   SOCIAL HISTORY:  [name] is divorced. She does not have any children. She  is currently unemployed. She does not use tobacco or alcohol at this time.  She lives with her mother. Her father was recently deceased in 15-Dec-2002.  They live in a single level dwelling. [name] tells me she will not have  assistance available after discharge from the hospital.   FAMILY HISTORY:  Significant for father with cancer, coronary disease,  diabetes, arthritis.   REVIEW OF SYSTEMS:  In general, [name] says she feels okay except for the  pain in my foot. HEENT:  She wears eye glasses. She does have dentures,  upper and lower. She does not have  any pulmonary, cardiac, or GI complaints.  She is anuric. Reports some dizziness. Only her blood pressure is low. This  often happens with dialysis. She ambulates independently. Does not use any  assistive devices. No complaints of claudication. No skin rashes or  breakdown other than her left great toe. No recent fevers. No history of  blood clots. She does report some symptoms of depression. Her medication  does help. She reports that she is sleeping okay.   PHYSICAL EXAMINATION:  VITAL SIGNS:  Blood pressure unable to record today,  heart rate approximately 64 beats per minute.  GENERAL:  This is a 60 year old African-American female in no acute  distress.  HEENT:  Eyes:  PERRLA, EOMI. Oral mucosa is pink and moist.  NECK:  With a full range of motion. No carotid bruits, thyromegaly, or  lymphadenopathy.  CHEST:  Her breathing is unlabored. Her breath sounds are clear bilaterally.  HEART:  Her heart is an irregular rate and rhythm.  ABDOMEN:  Her abdomen is obese. It is soft and nontender.  EXTREMITIES:  She has palpable femoral pulses bilaterally. No palpable  popliteal or pedal pulses bilaterally. Her left foot is slightly malodorous  with 2+ edema from the mid forefoot through the toes. There is no drainage.  The great toe is with evidence of dry gangrene.  NEUROLOGICAL:  She is alert and oriented. Cranial nerves II-XII are grossly  intact. Her gait is steady. She does have a fine hand tremor.   LABORATORY DATA:  Pending. They will include CBC, BMET, PT, and INR.   IMPRESSION:  This is a 60 year old female with diabetes mellitus type 1, end-  stage renal disease on hemodialysis, and limb threatening ischemia of the  left foot.   PLAN:  Plan is for admission to [facility] under the care of Dr.  [name] on January 05, 2003 for left femoral to tibial bypass and left great toe amputation. The renal service will be consulted on admission for  inpatient hemodialysis. Case  management will also be consulted regarding any  discharge needs.      Toribio Harbour, N.P.                  [name]. [name], M.D.    CTK/MEDQ  D:  01/03/2003  T:  01/03/2003  Job:  [id number]  

## 2010-07-11 NOTE — H&P (Signed)
NAME:  APHRODITE, HARPENAU                           ACCOUNT NO.:  [id number]   MEDICAL RECORD NO.:  [id number]                   PATIENT TYPE:  INP   LOCATION:  2887                                 FACILITY:  [facility]   PHYSICIAN:  Aram Beecham B. Eliott Nine, M.D.             DATE OF BIRTH:  [birth year]   DATE OF ADMISSION:  03/27/2003  DATE OF DISCHARGE:                                HISTORY & PHYSICAL   Ms. Hasten is a 60 year old black female with past medical history significant  for end-stage renal disease secondary to diabetes on chronic dialysis on  Tuesday, Thursday, and Saturday at [facility].  Transferred to [facility] for a right femoral-popliteal bypass  graft for right infrainguinal arterial occlusive disease with rest pain.  She also has a history of left great toe amputation with left femoral-  popliteal bypass graft.  She is postoperative at this point when I see her  and cannot give me a good history nor alert enough to answer questions.   ALLERGIES:  CODEINE, CAPTOPRIL, and ASPIRIN.   PAST MEDICAL HISTORY:  1. End-stage renal disease with dialysis Tuesday, Thursday, and Saturday at     [facility], 4 hours, 450/800, 2K 2.5 calcium 118 kg,     10K heparin, 10K EPO, InFeD 100 per dialysis.  2. Insulin-dependent diabetes mellitus, type 2, for eight years.  3. Hypertension.  4. Congestive heart failure, EF 25%.  5. Stroke in 1989 with no residual deficit.  6. MI in 1989.  7. Anemia of chronic disease, iron-deficiency.  8. Depression and anxiety.  9. COPD.  10.      Pulmonary hypertension.  11.      GERD.  12.      Left ankle fracture July 2003.  13.      Left shoulder bursitis January 2004.   MEDICATIONS:  1. PhosLo gelcaps 667 mg p.o. b.i.d.  2. Zocor 20 mg 1 p.o. q.h.s.  3. Reglan 10 mg 2 p.o. t.i.d.  4. Seroquel 25 mg 1 p.o. q.h.s.  5. Wellbutrin 100 mg 1 p.o. b.i.d.  6. Toprol XL 25 mg 1 p.o. q.h.s.  7. Nephro-Vite 1 p.o.  daily.  8. Lorazepam 1 mg 1 p.o. b.i.d.  9. Insulin 70/30, 30 units q.a.m., 50 units q.p.m.  10.      OxyContin 10 mg 1 p.o. b.i.d. p.r.n.  11.      Hydrocodone p.r.n.  12.      Serevent 1 puff b.i.d.  13.      Flovent 2 puffs b.i.d.   FAMILY HISTORY:  Unable to obtain, but no history of any coronary artery  disease.   SOCIAL HISTORY:  She is single.  She lives with her parents.  No children,  no tobacco, no alcohol use.   REVIEW OF SYSTEMS:  Unable to obtain as patient is sleepy postop.   PHYSICAL EXAMINATION:  VITAL  SIGNS:  Pulse 93, blood pressure 135/76,  temperature 100.4, respiratory rate 18 per minutes, O2 saturation 100% on 4  liters.  CARDIOVASCULAR:  Regular rate and rhythm, no murmurs, rubs, or gallops.  ABDOMEN:  Soft, nontender, nondistended, obese.  RESPIRATORY:  Clear to auscultation bilaterally, no wheeze or rhonchi on  auscultation.  EXTREMITIES:  Left arm graft is patent with good bruit heard.  No edema,  cyanosis, or clubbing in  lower extremities.  Left foot:  Big toe has been  amputated, large area of open red granulation tissue is seen.  Small areas  of  pus also present.  Right foot:  Dark gangrenous area around the big toe.   LABORATORY DATA:  White count 15.9, hemoglobin 12, hematocrit 38.2,  platelets 271.  Sodium 136, potassium 3.3, chloride 98, CO2 28, glucose 122,  BUN 52, creatinine 0.1, and calcium 9.2, phosphorus 5.4, albumin 3.   ASSESSMENT AND PLAN:  1. Status post right femoral-popliteal bypass graft, patient now 30 minutes     postop.  Will follow with Dr. [name].  2. End-stage renal disease.  The patient's last dialysis was Saturday,     January 29, was due for dialysis today.  Her labs came back with     __________ for dialysis today.  We will continue this with her as an     outpatient.  3. Diabetes.  She was started on 70/30 insulin but is not eating yet so will     be restarted when she starts taking p.o.  4. Left toe wound.  This  area was pretty raw and required more attention     than at home. [facility] has had wound wrappings on it and we will ask for     wound consult to look at the wound.  We have taken swabs of her foot for     AFB as well as aerobic and anaerobic cultures which are pending at this     time.  We will switch her over from Ancef to Zosyn and follow regarding     foot debridement.  5. Hypertension.  Blood pressure under reasonable control at this point, but     she is postop. Will need to put her back on Toprol XL 25 mg 1 p.o. q.h.s.  6. Congestive heart failure for hemodialysis today.  Will monitor for now.  7. Chronic obstructive pulmonary disease.  Put back on her home medications,     Serevent and Flovent, Albuterol p.r.n.  8. For depression, I put her back on Wellbutrin, __________ and  lorazepam.  9. Gastroesophageal reflux disease.  Put on Protonix.  10.      Anemia.  Will check CBC and follow up.      Adonis Housekeeper, M.D.                         Duke Salvia Eliott Nine, M.D.    TS/MEDQ  D:  03/27/2003  T:  03/27/2003  Job:  [id number]

## 2010-07-11 NOTE — Consult Note (Signed)
[facility]  Patient:    [name], [name]                        MRN: [id number] Proc. Date: 12/13/99 Adm. Date:  [id number] Attending:  Anastasio Auerbach CC:         Anastasio Auerbach, M.D.  [facility]   Consultation Report  HISTORY OF PRESENT ILLNESS:  Thank you very much for asking me to see [name] in consultation.  She is a 60 year old black female with multiple and complex medical problems in large part related to morbid obesity.  She presents with increasing dyspnea, anasarca, and congestive heart failure with marked accumulation of edema.  The patient is noncompliant and it is really difficult to ascertain the wheres and wherefores of worsening of her condition.  She stopped her medicines about three months ago due to nausea and vomiting.  She has had no recurrent episodes of chest pain.  Her  PAST MEDICAL HISTORY:  Her past medical history is probably a large part hampered because of her obesity and difficulty with imaging.   1. She is reported to have a dilated cardiomyopathy with an ejection fraction     of 20% per echo in November 1997.  Echocardiogram today is technically so     limited that it really does not confirm or deny that report.  2. She has a history of repeated exacerbations of congestive heart failure.  3. Morbid obesity.  4. Medical compliance.  5. Insulin-dependent diabetes mellitus.  6. History of renal insufficiency.  7. History of reactive airway disease.  8. Gastroesophageal reflux disease.  9. Hypertension diagnosed in 1992. 10. Gastroparesis. 11. Retinopathy. 12. Neuropathy. 13. History of myocardial infarction December 1998 while in     [place]. 14. History of cerebrovascular accident in December 1998 with     left sided weakness two weeks after myocardial infarction.  ALLERGIES:  Codeine and coated aspirin.  MEDICATIONS:  Current medications while in the hospital included: 1. Humulin insulin sliding  scale. 2. IV heparin. 3. Lanoxin 0.125 mg q.d. 4. IV nitroglycerin. 5. IV Protonix. 6. Reglan 10 mg four times a day. 7. Lasix 80 mg IV q.12h. 8. Lotensin 10 mg q.d. 9. Baby aspirin.  FAMILY HISTORY:  Father is living at age 1 with a history of bypass surgery in 1998.  Mother is alive at age 22 with osteoarthritis.  SOCIAL HISTORY:  She lives with her parents.  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 She has been disabled since 1.  No smoking, drugs or alcohol.  REVIEW OF SYSTEMS:  No real chest pain.  No recurrent nausea or vomiting. Basically she is homebound.  She is morbidly obese and she reports a 100 pound weight gain over the past few months.  PHYSICAL EXAMINATION:  GENERAL:  She is a morbidly obese dyspneic black female.  VITAL SIGNS:  Blood pressure 167/102, heart rate 80, respiratory rate 22.  SKIN:  Warm and dry.  Color is normal.  NECK:  Full.  LUNGS:  Decreased secondary to body habitus.  HEART:  Regular rate and rhythm.  There is no gallop.  She is not tachycardic. S1 and S2 are really of normal intensity.  ABDOMEN:  Morbidly and grossly obese.  BREASTS:  Enlarged.  EXTREMITIES:  Generalized edema and anasarca.  NEUROLOGICAL: Intact.  O2 saturations are 96% on two liters.  LABORATORY DATA:  Troponin is 0.93, CK is 104 with MB of 8.2.  White count 7000, hematocrit 43.  BUN 35, creatinine 2.6, glucose 230.  Potassium is 3.7. CO2 is 22.  The EKG showed normal sinus with no acute changes.  She was has poor R-wave progression in V1-V3.  OVERALL IMPRESSION: 1. Morbid obesity, ? hypoventilation. 2. History of a dilated cardiomyopathy with poorly reduced ejection fraction. 3. Morbid obesity. 4. History of congestive heart failure and anasarca, questionably related to    #1. 5. Medical noncompliance. 6. Insulin-dependent diabetes mellitus but end-organ damage. 7. Reported myocardial infarction and cerebrovascular accident. 8. Renal insufficiency.  PLAN:   I think we need to proceed on with gentle hydration.  I will check blood gases and make sure she is not hypoventilating.  I think that you have to be careful about rapid ventilation.  I think she is really going to be slow to progress.  The 2-D echocardiogram was performed which really was not very helpful.  She does not have a large pericardial effusion.  I certainly agree with the plan that you are undertaking and have very little else to add. Thanks for allowing me to participate in her care.   Please call if you have other questions or concerns. DD:  12/13/99 TD:  12/14/99 Job: [id number]

## 2010-07-11 NOTE — Discharge Summary (Signed)
[name], [name]                 ACCOUNT NO.:  [id number]   MEDICAL RECORD NO.:  [id number]          PATIENT TYPE:  INP   LOCATION:  6706                         FACILITY:  [facility]   PHYSICIAN:  Ricki Rodriguez, M.D.  DATE OF BIRTH:  [birth year]   DATE OF ADMISSION:  12/24/2007  DATE OF DISCHARGE:  01/04/2008                               DISCHARGE SUMMARY   HOSPITAL LOCATION:  6706, bed 1.   FINAL DIAGNOSES:  1. Paroxysmal ventricular tachycardia.  2. Cardiac arrest.  3. Hypertensive renal disease with renal failure, end-stage renal      disease, respiratory failure, secondary hyperparathyroidism.  4. Acidosis.  5. Long-term use of insulin.  6. Morbid obesity.  7. Coronary atherosclerosis of native coronary vessel.  8. Above-knee amputation status.   DISCHARGE MEDICATIONS:  1. Hectorol 2 mcg IV Tuesday, Thursday, Saturday by dialysis nurse.  2. Plavix 75 mg one daily.  3. Neurontin 100 mg 3 times daily.  4. Zyprexa 2.5 mg at bedtime.  5. Albuterol 90 mcg inhaler 2 puffs 4 times daily as needed.  6. Omeprazole 20 mg one twice daily.  7. Vancomycin 1000 mg on Tuesday, Thursday, Saturday x1 week.  8. Nepro with a carb vanilla liquid 8 ounces daily.  9. Bupropion 150 mg one daily.  10.Metoprolol 12.5 mg daily.  11.Simvastatin 40 mg at bedtime.  12.Lantus Insulin 5 units at bedtime if blood sugar over 150 or 7      units if over 250 mg and 10 units if over 300 mg.  13.Reglan 10 mg twice daily.  14.Nephro-Vite one daily.  15.PhosLo one with all meals.   DISCHARGE DIET:  Low-sodium heart-healthy diet with 1000 mL fluid  restriction per day.   DISCHARGE ACTIVITY:  The patient is to increase activity slowly.   SPECIAL INSTRUCTION:  The patient is to stop any activity that causes  chest pain, shortness of breath, dizziness, sweating, or excessive  weakness.   Followup by Dr. Orpah Cobb in 2-4 weeks and by Dr. Mattingly/[place]  [facility] in 2-4 weeks.   HISTORY:  This  60 year old black female had a cardiac arrest with CPR  done.  The patient was intubated and she was admitted to critical care  specialist in coronary care unit.  The patient had known history of  ischemic cardiomyopathy and had an AICD placed.  She has severe  peripheral vascular disease.   PHYSICAL EXAMINATION:  HEENT:  The patient was unresponsive, intubated.  LUNGS:  Grossly clear anteriorly.  HEART:  Tachycardia with distant sounds.  ABDOMEN:  Obese, positive bowel sounds.  EXTREMITIES:  Left above-the-knee amputation, right lower extremity with  edema and second toe amputation and right great toe amputation.   LABORATORY DATA:  WBC count elevated at 16,000, hemoglobin 13.3,  hematocrit 39, platelet count 390,000, subsequent WBC count came down to  8500 on January 02, 2008.  Her INR was 1.1-1.3 range.  Her electrolytes  were near normal except for CO2 of 17.  Subsequent CO2 was up to 26, BUN  was around 30, creatinine was 4.0-6.4 range.  Her AST was  elevated at  42, ALT 68.  Subsequent AST was 18, CK-MB was high at 24.5 with a  troponin-I of 2.68.  Next, CK-MB was 22.8 and troponin-I of 2.3, and a  final CK-MB was 1.7 with troponin-I of 0.42 on December 29, 2007.  Vancomycin trough level was 14.6.  The patient had O+ blood group and  type.  Blood cultures were negative, nonpathogenic oropharyngeal flora.  The patient receive 2 units of blood.   Chest x-ray revealed a right upper lobe airspace opacity with possible  infection, atypical edema or aspiration.  Head CT without contrast, no  acute intracranial abnormality.   EKG revealed sinus rhythm with nonspecific intraventricular conduction  block and lateral ischemia.   Cardiac catheterization showed total chronic occlusion of right coronary  artery and left circumflex coronary artery, dilated LV with severe LV  systolic dysfunction, ejection fraction of 15-20%.  LAD had proximal 30%  narrowing, otherwise unremarkable.    HOSPITAL COURSE:  The patient was admitted to coronary care unit.  She  was resuscitated and intubated.  Hypothermia was induced after  aggressive care by critical care management.  The patient's body was  gradually rewarmed post 48 hours with abnormal cardiac enzymes and  respiratory and cardiac failure.  The patient underwent cardiac  catheterization.   The coronary showed chronic total occlusion of the left circumflex and  right coronary arteries with ejection fraction of 15-20%.  The patient  was not a candidate for revascularization.  EP consult was obtained.  However, due to the patient's infection in the lung and MRSA status, the  ICD placement was postponed.  The patient had hemodialysis for her renal  failure condition.  She got extubated on December 30, 2007.  Her vital  signs remained stable.  She was transferred to 6700 renal unit and  initially, the patient was scheduled to be transferred to area nursing  home; however, the patient and family chose to continue care at home.  This was discussed with family members especially  the brother and in spite of renal doctor's recommendation, the patient  chose to go home and continue hemodialysis on outpatient basis and the  patient was discharged in satisfactory condition on January 04, 2008  with followup by me and renal physicians as arranged.      Ricki Rodriguez, M.D.  Electronically Signed     ASK/MEDQ  D:  02/01/2008  T:  02/02/2008  Job:  [id number]

## 2010-07-11 NOTE — Op Note (Signed)
NAME:  [name], [name]                           ACCOUNT NO.:  [id number]   MEDICAL RECORD NO.:  [id number]                   PATIENT TYPE:  INP   LOCATION:  5501                                 FACILITY:  [facility]   PHYSICIAN:  [name]. [name], M.D.        DATE OF BIRTH:  [birth year]   DATE OF PROCEDURE:  01/11/2003  DATE OF DISCHARGE:                                 OPERATIVE REPORT   PREOPERATIVE DIAGNOSIS:  Nonhealing left great toe amputation site.   POSTOPERATIVE DIAGNOSIS:  Nonhealing left great toe amputation site.   PROCEDURE:  Ray amputation of the left great toe.   SURGEON:  [name]. [name], M.D.   ANESTHESIA:  General.   SURGICAL TECHNIQUE:  The patient was taken to the operating room, received a  general anesthetic.  The foot was prepped and draped in the usual sterile  fashion.  A tennis racquet incision was made encompassing what was remaining  of the left great toe and dissection was carried down to the metatarsal bone  which was divided using a Gigli saw.  The bone was then rasped and all  devitalized tissue debrided.  The wound was copiously irrigated with saline.  Hemostasis was obtained using electrocautery.  The wound was closed, about  75% of it, with interrupted 3-0 nylon suture.  A sterile dressing was  applied.  The wound was packed.  The patient tolerated the procedure well  and was transferred to the recovery room in satisfactory condition.                                               [name]. [name], M.D.    CSD/MEDQ  D:  01/11/2003  T:  01/12/2003  Job:  [id number]

## 2010-07-11 NOTE — H&P (Signed)
[place]  Patient:    [name], [name]                        MRN: [id number] Adm. Date:  [id number] Attending:  Anastasio Auerbach CC:         Anastasio Auerbach, M.D.   History and Physical  CHIEF COMPLAINT:  Shortness of breath, congestive heart failure, and anasarca.  HISTORY OF PRESENT ILLNESS:  [name] is an unfortunate morbidly obese 60 year old female with the following history:  1. Morbid obesity.  2. Dilated cardiomyopathy, with an ejection fraction of 20% noted on a     2-D echocardiogram in 1997.  3. Congestive heart failure.  4. Hypertension.  5. Aortic sclerosis.  6. Type 2 diabetes mellitus for approximately seven years.  7. Diabetic gastroparesis since 1995, with an abnormal gastric emptying     study at that time.  8. Chronic medical noncompliance.  9. Reported history of a myocardial infarction in 1998, in [place], [place]. 10. 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 11. Question of reactive airway disease/asthma.  The patient has been followed at [facility] for the last several years, and was last seen about three months ago.  There was an attempt to transfer her outpatient treatment to the medical clinic at [facility], but the appointment was never kept.  She apparently stopped all medications approximately three to four weeks ago, because of nausea and vomiting, which she assumed was secondary to her gastroparesis.  She now presents with severe shortness of breath, but no chest pain.  She has obvious pulmonary edema on chest x-ray, and she has anasarca on physical examination.  Her cardiac enzymes are mildly positive for a myocardial injury.  After treatment with intravenous Lasix, morphine, and nitroglycerin in the [facility], as well as nasal BiPAP, her oxygenation improved greatly, and she has now been  changed to nasal cannula O2.  CURRENT MEDICATIONS: 1. Lotensin, question dose. 2. Reglan 10 mg with meals. 3. Insulin 70/30, 30 units subcutaneously q.a.m. and 24 units subcu    q.p.m. 4. Lanoxin 0.125 mg q.d. 5. Norvasc, question 5 mg q.d. 6. Cardura, question h.s. dose. 7. Pepcid, question dose. 8. Lasix 60 mg q.d.  ALLERGIES:  The patient states that she has had hives when given ENTERIC-COATED ASPIRIN.  Regular aspirin is apparently okay.  CODEINE has caused a rash.  CAPOTEN has caused a cough.  PAST SURGICAL HISTORY:  Noncontributory.  FAMILY HISTORY:  Positive for diabetes mellitus and hypertension.  SOCIAL HISTORY:  The patient is divorced and lives with her parents.  She has no children.  She has been disabled for six years, secondary to her cardiac condition.  No smoking, no alcohol, no drugs.  REVIEW OF SYSTEMS:  There is a question of a history of chronic pancreatitis. She does have nausea and vomiting intermittently, worse as of late.  No known history of gallstones.  PHYSICAL EXAMINATION:  GENERAL:  A morbidly obese female, sitting at 90 degrees with nasal BiPAP mask which was removed to nasal cannula O2.  She is alert and oriented.  VITAL SIGNS:  Blood pressure 162/94, pulse 100 and regular, respirations 16 and easy, O2 saturation 100% on 6 L, nasal cannula.  She is afebrile. Temperature 98.2 degrees.  HEENT:  Normocephalic, atraumatic.  Oropharynx clear.  NECK:  Obese,  supple.  CHEST:  Diffuse quiet rales, more in the bases.  Scattered expiratory wheezes present but faint.  CARDIAC:  Distant heart sounds, but no rub heard.  BREASTS:  Skin over the breasts is actually edematous.  No erythema.  ABDOMEN:  Diffuse abdominal wall edema as well.  It is tense.  Bowel sounds positive.  No rebound.  EXTREMITIES:  With 4+ edema throughout.  No cyanosis.  NEUROLOGIC:  Oriented x 3.  Question faint weakness of the left lower extremity, relative to the right.   Otherwise nonfocal.  The patient reports having had dysarthria with her "stroke" in 1998, and with left lower extremity weakness.  Cranial nerves are currently intact.  SKIN:  Edema present from her breasts down to her toes diffusely.  A chest x-ray reveals cardiomegaly, bilateral pleural effusions, congestive heart failure, and question of pericardial effusion was mentioned.  Electrocardiogram:  Sinus rhythm at 86, with nonspecific ST-T wave changes. She has poor R-wave progression in her voltage, compared with electrocardiogram from August 04, 1997.  It is markedly reduced throughout.  White count 7400, hemoglobin 14.4, platelets 296,000.  Sodium 138, potassium 3.7, chloride 112, bicarbonate 22, BUN 35, creatinine 2.6, blood sugar 230. Calcium 8.1, albumin quite low at 1.7, total protein 5.2.  Liver function tests normal.  Urinalysis reveals a specific gravity of 1.016, pH of 6, glucose 500, moderate hemoglobin, leukocyte esterase negative, nitrite negative, protein greater than 300 mg per dl.  CPK 304, CPK-MB 8.2, relative index 2.7, troponin I mildly elevated at 0.93. Normal is up to 0.04.  ASSESSMENT:  A 60 year old unfortunate female with a history of gastroparesis secondary to diabetes mellitus, with nausea and vomiting for several weeks, and no medication use for at least three to four weeks.  Last seen at [facility] approximately three months ago by history.  She denies chest pain. The last ischemic workup was in September 1996, where a Cardiolite testing revealed no reversible defects.  She now presents with anasarca, renal insufficiency, proteinuria, congestive heart failure, borderline positive cardiac enzymes.  She has diuresed approximately 1500 cc with 80 mg of Lasix x 2 IV, and with morphine sulfate and nitroglycerin.  PLAN: 1. Congestive heart failure:  Continue IV diuresis with 80 mg of Lasix    q.12h.  Follow electrolytes.  Check a 2-D echocardiogram to rule  out     pericardial effusion.  No hypotension present.  Continue IV nitroglycerin    and p.r.n. morphine sulfate.  Resume Lanoxin therapy and Lotensin    therapy.  Will need to follow the renal function closely. 2. Positive cardiac enzymes:  Check two more serial cardiac enzymes.  Doubt    significant myocardial injury.  Continue IV heparin and IV nitroglycerin. 3. Anasarca/proteinuria/reduced albumin/increased creatinine:  She has likely    developed nephrotic syndrome.  May be part and parcel of diabetic    nephropathy and hypertensive nephropathy.  Will check 24-hour for total    protein, check S-PEP and urine immunoelectrophoresis.  Will put her at    bedrest and elevate legs, and continue diuresis. 4. Hypertension:  Resume Lotensin 10 mg q.d.  Follow up BMET.  Need to be    careful about inducing prerenal azotemia, secondary to the ACE inhibitors.    She may need her beta blocker readded carefully.  Will not add Norvasc    yet. 5. Diabetes mellitus:  Give Humulin 70/30 at half dose, 16 units q.a.m.,    12 units q.p.m., and use sliding scale Regular  insulin.  Will provide    an 1800 calorie low-salt diet. 6. Nausea and vomiting:  Resume Reglan therapy 10 units IV or p.o. with    meals and q.h.s.  Consider an ultrasound to rule out gallstones. DD:  12/13/99 TD:  12/13/99 Job: [id number]

## 2010-07-11 NOTE — Consult Note (Signed)
[place]  Patient:    [name], [name]                        MRN: [id number] Adm. Date:  [id number] Attending:  Anastasio Auerbach CC:         Anastasio Auerbach, M.D.   Consultation Report  REASON FOR CONSULTATION:  I was asked by Dr. Sheppard Penton to see this 60 year old female, admitted on October 20 with volume overload and congestive heart failure.  She has known diabetes mellitus complicated by gastroparesis, retinopathy status post laser therapy, and a history of dilated cardiomyopathy with diastolic dysfunction and an ejection fraction of 20%.  She was admitted two days ago with increasing edema over several months, as well as increased shortness of breath.  Labs on admission revealed a BUN of 35, creatinine 2.6, albumin 1.5, and urinalysis revealed greater than 300 mg of protein, 6-10 red blood cells, and 24 hour urine for protein returned at greater than 12,000 mg of protein.  Therefore, renal consultation was requested.  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  CURRENT MEDICATIONS: 1. Lotensin, increased today from 10 mg to 20 mg per day. 2. Aspirin 81 mg per day. 3. Reglan 10 mg q.i.d. 4. Digoxin 0.125 mg q.d. 5. Tessalon 100 mg t.i.d. 6. Protonix 40 mg q.d. 7. Nitroglycerin patch 0.2 mg/hour. 8. Insulin 70/30. 9. Nephro-Vite.  PRIOR RENAL STUDIES:  Have included a urinalysis on June 06, 1996 which revealed specific gravity 1.006, pH 6.0, 100 mg of protein, 500 mg/dcl of glucose, 3+ SSA.  Serial creatinines have included the following:  On June 10, 1996, serum creatinine 1.0; on August 04, 1997, serum creatinine 1.3; on December 13, 1999, serum creatinine 2.7.  SOCIAL HISTORY:  She lives with her parents.  She is disabled.  She does not smoke  cigarettes or consume alcoholic beverages.  She has had a problem with nonadherence to therapy in the past.  FAMILY HISTORY:  Notable in that she has a maternal grandmother with end-stage renal disease who has received hemodialysis and a maternal aunt who, likewise, received hemodialysis in the past, and who is deceased.  PHYSICAL EXAMINATION:  VITAL SIGNS:  Blood pressure 175/105, 1900 cc of urine output over the past eight hours, intake 1250, output 5925 cc over the past 24 hours.  Her weight is reported as being increased over the prior 24 hours, but I think this is an error.  GENERAL:  She is an obese African-American female with evidence of anasarca.  HEENT:  Neck veins are distended.  LUNGS:  Decreased breath sounds at bases.  HEART:  Regular rhythm and rate.  ABDOMEN:  Obese.  There is some right upper quadrant tenderness.  EXTREMITIES:  With 3+ edema bilaterally in the lower extremities.  LABORATORY DATA:  Sodium 140, potassium 3.7, chloride 107, CO2 29, BUN 34, creatinine 2.7.  ASSESSMENT: 1. 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 2. Nephrotic syndrome, secondary to #1. 3. Volume overload, secondary to #1, #2, and #4. 4. History of dilated cardiomyopathy/congestive heart failure. 5. Obesity. 6. History of insulin-dependent diabetes mellitus with complications,    gastroparesis, retinopathy, neuropathy. 7. Hypertension. 8. History of nonadherence to medical therapy.  PLAN: 1. Complete the renal workup, to include a renal ultrasound, SPEP, UPEP, ANA,    C3 and C4. 2. Dialysis education. 3.  Save nondominant arm for dialysis access. 4. Continue ACE inhibitor therapy, carefully now with the increased dose, and    continue aggressive diuretic therapy. 5. Repeat urinalysis to see if microhematuria has persisted or whether it    needs further evaluation.  Thank you for letting us see this patient.  We will follow patient  closely with you during this hospitalization. DD:  12/15/99 TD:  12/15/99 Job: [id number]

## 2010-07-11 NOTE — Discharge Summary (Signed)
NAME:  [name], [name]                           ACCOUNT NO.:  [id number]   MEDICAL RECORD NO.:  [id number]                   PATIENT TYPE:  INP   LOCATION:  5524                                 FACILITY:  [facility]   PHYSICIAN:  [name]. [name], M.D.        DATE OF BIRTH:  03/21/1950   DATE OF ADMISSION:  03/27/2003  DATE OF DISCHARGE:  04/05/2003                                 DISCHARGE SUMMARY   TENTATIVE DATE OF DISCHARGE:  April 05, 2003.   HISTORY OF PRESENT ILLNESS:  This is a 60 year old woman who presents with  progressive pain of the right great toe with dry gangrene.  She has been on  large doses of narcotics to control her pain and the pain is quite  disabling.  She was seen in vascular surgery consultation with [name]. [name], M.D., who discussed the options of attempted revascularization  versus primary amputation.  She had elected to proceed with attempted  revascularization, understanding there was felt to be only a marginal chance  of success.  The patient originally presented with a nonhealing wound of her  left great toe.  She underwent an arteriogram on October 27, 2002, which  showed no significant aortoiliac disease.  She was noted to have  infrainguinal arterial occlusive disease with runoff via the posterior  tibial arteries only on both sides.  She underwent a left femoral to  posterior tibial bypass with composite 6 mm PTFE plus left greater saphenous  vein on January 05, 2003.  Of note, at the time of surgery, she was found  to have markedly calcified arteries related to her diabetes and it was felt  to be a difficult case.  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  During those visits, the disease has progressed in  the right toe.  She was admitted this hospitalization for revascularization.   PAST MEDICAL HISTORY:  Significant for:  1. Insulin-dependent diabetes.  2. History of  hypertension.  3. Chronic renal insufficiency on dialysis on Tuesday, Thursday, and Sunday.  4. Myocardial infarction in 1989.  5. History of congestive heart failure.  Her cardiologist is Dr. Algie Coffer.  6. Stroke in 1989 with no residual deficit.   FAMILY HISTORY:  Please see the history and physical done at the time of  admission.   SOCIAL HISTORY:  Please see the history and physical done at the time of  admission.   REVIEW OF SYSTEMS:  Please see the history and physical done at the time of  admission.   PHYSICAL EXAMINATION:  Please see the history and physical done at the time  of admission.   MEDICATIONS ON ADMISSION:  1. PhosLo gelcaps 667 mg three capsules p.o. b.i.d.  2. Zocor 20 mg p.o. q.h.s.  3. Metoclopramide 10 mg two p.o. before each meal.  4. Lorazepam  1 mg p.o. b.i.d.  5. Insulin 70/30 30 units in the morning and 20 units at night.  6. OxyContin 10 mg p.o. b.i.d. p.r.n.  7. Hydrocodone/acetaminophen 7.5/500 mg one or two every four to six hours     as needed for pain.   HOSPITAL COURSE:  The patient was admitted electively on March 27, 2003,  and taken to the operating room where she underwent the following procedure:  Right femoral to below knee popliteal bypass with nonreversed translocated  greater saphenous vein.  Findings again proved to show markedly calcified  popliteal and tibial vessels.  It is Dr. Adele Dan opinion that if this  graft becomes occluded, it is not a revisable situation and he recommends no  further attempts at revision.  The patient tolerated the procedure well and  was taken to the postanesthesia care unit in stable condition.   POSTOPERATIVE HOSPITAL COURSE:  The patient has done fairly well.  Her pain  has been adequately controlled.  She has maintained stable hemodynamics.  The nephrology service has followed her throughout and has managed her renal  disease, as well as other  medical issues, including diabetes, hypertension,  congestive heart failure, COPD, depression, anxiety, gastrointestinal  reflux, and anemia.  The patient did have some cultures obtained from her  left toe amputation site and these subsequently grew Enterococcus and  Klebsiella.  She was treated with Unasyn after initial postoperative  treatment with intravenous Zosyn.  Subsequently she was changed to an oral  regimen of Cipro 500 mg b.i.d.  In regards to her healing, aggressive wound  care has show gradual improvement on the left side.  All incisions are felt  to be healing adequately, although there is some serious drainage from the  right thigh, but this is felt to be minimal.  The patient's grafts are  patent in both lower extremities.  She has tolerated a gradual increase in  activity with physical therapy assistance.  She is felt to be a candidate  for inpatient rehabilitation.  When this is finalized, she will be  transferred to same.   MEDICATIONS ON TRANSFER:  1. Ativan 1 mg b.i.d.  2. Seroquel 100 mg q.h.s.  3. Toprol XL 25 mg q.h.s.  4. Wellbutrin 100 mg b.i.d.  5. Insulin 70/30 30 units daily a.c.  6. Insulin 70/30 20 units a.c. q.p.m.  7. PhosLo 667 mg t.i.d. with meals.  8. Zocor 20 mg q.h.s.  9. Reglan 20 mg t.i.d. a.c.  10.      Renal formula vitamin one tablet daily.  11.      Serevent one puff b.i.d.  12.      Calcitriol 0.75 mcg IV after hemodialysis.  13.      Procrit 10,000 units on dialysis days.  14.      Sliding scale insulin.  15.      Novolog subcutaneous.  For CBG 60-100 give 0 units, CBG 101-150     give 2 units, CBG 151-200 give 3 units, CBG 201-250 give 6 units, CBG 251-     300 give 9 units, and CBG 301-350 give 12 units.  16.      Protonix 40 mg daily.  17.      Lovenox 30 mg daily.  18.      Cipro 500 mg q.24h.  19.      Iron dextran 100 mg every seven days.  20.      Iron dextran 100 mg on dialysis days. 21.  Nepro Vanilla nutritional  supplement b.i.d.  22.      Albuterol 2.5 mg hand-held nebulizers q.6h. p.r.n.  23.      Clonidine 0.1 mg p.r.n. for systolic blood pressure greater than     180 or diastolic blood pressure greater than 100.  24.      Tylenol p.r.n.  25.      Dulcolax p.r.n.  26.      Phenergan p.r.n.  27.      Labetalol p.r.n.  28.      Guaifenesin/dextromethorphan p.r.n.  29.      Chloraseptic p.r.n.  30.      Oxycodone 5-10 mg q.4h. p.r.n.   FINAL DIAGNOSIS:  Severe bilateral infrainguinal arterial occlusive disease.   OTHER DIAGNOSES:  1. End-stage renal disease.  2. Insulin-dependent diabetes mellitus.  3. Hypertension.  4. Congestive heart failure.  5. Ejection fraction 25%.  6. Stroke in 1989 with no residual deficit.  7. Myocardial infarction in 1989.  8. Anemia of chronic disease/iron deficiency.  9. Depression and anxiety.  10.      Chronic obstructive pulmonary disease.  11.      Pulmonary hypertension.  12.      Gastrointestinal reflux.  13.      Previous left ankle fracture.  14.      History of left shoulder bursitis.  15.      Peripheral vascular surgery as described above.   FOLLOWUP:  Followup should include Dr. [name] two weeks post discharge.  Will also consider to observe the patient intermittently while in  rehabilitation.   ACTIVITY:  As dictated per the rehabilitation physician group in conjunction  with physical therapy.   WOUND CARE:  The patient should continue current wound care with dressing  changes to the left great toe twice daily, wet to dry normal saline with 4 x  4s, Kerlix, and 4-inch Ace wrap.   CONDITION ON DISCHARGE:  Stable and improving.      Rowe Clack, P.A.-C.                    [name]. [name], M.D.    Sherryll Burger  D:  04/04/2003  T:  04/04/2003  Job:  [id number]   cc:   Aram Beecham B. Eliott Nine, M.D.  [address]  [place]  [place] [address]  Fax: [phone]   Ricki Rodriguez, M.D.  [address] [place]  [place] [address]

## 2010-07-11 NOTE — Discharge Summary (Signed)
[name], [name]                 ACCOUNT NO.:  [id number]   MEDICAL RECORD NO.:  [id number]          PATIENT TYPE:  INP   LOCATION:  5527                         FACILITY:  [facility]   PHYSICIAN:  Terrial Rhodes, M.D.DATE OF BIRTH:  [birth year]   DATE OF ADMISSION:  04/05/2006  DATE OF DISCHARGE:  04/09/2006                               DISCHARGE SUMMARY   DISCHARGE DIAGNOSES:  1. Pulmonary edema.  2. Pneumonia.  3. Diabetes mellitus.  4. End-stage renal disease.  5. Anemia.  6. Secondary hyperparathyroidism.  7. Diabetic gastroparesis.  8. Gastroesophageal reflux disease.  9. Morbid obesity.   DISCHARGE MEDICATIONS:  1. Hydroxyzine 25 mg tablet by mouth as needed.  2. PhosLo 667 mg thee tablets p.o. t.i.d. a.c.  3. Norvasc 10 mg p.o. q.h.s.  4. Reglan 5 mg p.o. t.i.d. with meals and before bed.  5. Protonix 40 mg by mouth once daily.  6. Albuterol inhaler one to two puffs as needed.  7. Insulin 70/30 30 units in the morning, 20 units in the evening.  8. Avelox 400 mg by mouth once daily through February 21.  9. Zemplar 5 mcg three times a week with dialysis.  10.Epogen 8000 units IV three times a week with dialysis.  11.Home oxygen 2 L as needed.   DIALYSIS ORDERS AT THE TIME OF DISCHARGE:  Time:  4 hours.  2.0 K plus  bath.  Standard heparin.  BFR 400.  DFR 800.  Estimated dry weight is  119.5.   RADIOLOGIC STUDIES:  Chest x-ray on April 05, 2006, showed  cardiomegaly and pulmonary edema with focal air space disease on the  left.  Chest x-ray on April 07, 2006, showed worsening left upper  lobe pneumonia with developing bilateral lobe atelectasis, pulmonary  venous hypertension and edema improved.   LABORATORY DATA:  Hemoglobin at the time of discharge 12.4.  Renal  function panel at discharge:  Sodium 135, potassium 4.7, chloride 98,  bicarb 25, BUN 53, creatinine 8.55, albumin 2.4, calcium 9.4, phosphorus  5.2.  Blood cultures were negative x2.  Troponin  during hospital course  0.14 to 0.12 to 0.14.  CK and CK-MBs were normal.  TSH 0.616.  BNP 1407  on admission.   BRIEF HISTORY OF PRESENT ILLNESS:  The patient is a 60 year old female  with history significant for diabetes, hypertension, and end-stage renal  disease, who presented to the emergency department with a 1-day history  of shortness of breath as well as dry cough.  She also was having  intermittent fevers throughout the day.  In the emergency department she  had a chest x-ray which showed pulmonary edema as well as left upper  lobe infiltrate.  She did have an oxygen requirement of 2-3 L of oxygen.   HOSPITAL COURSE:  Problem 1.  PULMONARY EDEMA:  The patient underwent dialysis two times  within the first 24 hours.  A total of 9 L of fluid was removed.  Her  shortness of breath greatly improved after this.  It is felt that her  estimated dry weight was 124 on admission.  Her new estimated dry weight  is 119.5 kg.   Problem 2.  PNEUMONIA:  The patient was febrile with left upper lobe  infiltrate.  She received azithromycin and ceftriaxone x1 in the  emergency department and then was started on Avelox.  She will complete  a 10-day course of Avelox 400 mg p.o. once daily on April 15, 2006.  Sputum cultures were negative.  Blood cultures x2 were negative.  The  patient did not have any fevers after admission.  She did have an  intermittent oxygen requirement throughout her hospital stay.  Home O2  evaluation was performed and the patient did drop her oxygen saturation  into the 70s with ambulation.  The patient will be discharged home on 2  L of oxygen to use as needed.  The patient was instructed on avoiding  smoking or being near people who smoke while using oxygen.  It is also  felt that the patient's respiratory status would benefit greatly by  losing weight.  We also feel that the patient would benefit from a sleep  study for possible sleep apnea.   Problem 3.   END-STAGE RENAL DISEASE:  The patient received hemodialysis  two times on Tuesday and then resumed her normal Tuesday, Thursday, and  Saturday dialysis schedule.  Her next dialysis is due on Saturday,  April 10, 2006.  The patient's new estimated dry weight is 119.5 kg.  The patient's access was left arm AV graft.   Problem 4.  ANEMIA:  The patient was started on Aranesp 60 mcg while in  the hospital.  She will continue Epogen 8000 units with hemodialysis.  Her hemoglobin at discharge was 12.4.   Problem 5.  SECONDARY HYPERPARATHYROIDISM:  The patient was continued on  PhosLo 667 mg three tablets t.i.d. with meals as well as Hectorol 5 mcg  IV with hemodialysis.   Problem 6.  INSULIN-DEPENDENT DIABETES:  The patient was maintained on  sliding scale insulin as well as 70/30 insulin 30 units in the morning  and 20 units in the evening.   Problem 7.  DIABETIC GASTROPARESIS:  The patient continued on Reglan 5  mg t.i.d. a.c. and q.h.s. without problems.   Problem 8.  CHRONIC COUGH:  The patient has had a chronic cough for the  past several months.  She has seen Dr. Sherene Sires as an outpatient.  Recommended that the patient continue aggressive GERD treatment with  Protonix as well as Reglan.  The patient does need a chest CT as well as  CT of her sinuses to further evaluate this per pulmonology; however,  this was not done in the setting of acute pulmonary edema and pneumonia.  Recommend obtaining these tests at a later date as an outpatient.   DISCHARGE FOLLOW-UP:  [facility]  on April 10, 2006.   Follow-up issues, include monitoring oxygen saturations on home O2.  She  was also considering sleep study for obstructive sleep apnea.      Benn Moulder, M.D.    ______________________________  Terrial Rhodes, M.D.    MR/MEDQ  D:  04/09/2006  T:  04/09/2006  Job:  [id number]

## 2010-07-11 NOTE — Discharge Summary (Signed)
[place]  Patient:    [name], [name]                        MRN: [id number] Adm. Date:  [id number] Disc. Date: [id number] Attending:  Edwyna Perfect Dictator:   Melissa L. Susann Givens, M.D.                           Discharge Summary  RESIDENT PHYSICIAN:  Jannette Fogo, M.D.  DATE OF BIRTH:  12/12/1950.  DISCHARGE MEDICATIONS:  1. Norvasc 10 mg p.o. q.d.  2. Nephro-Vite one p.o. q.d.  3. Humulin 70/30 at 32 units in the morning and 24 units in the evening.  4. Lotensin 20 mg p.o. b.i.d.  5. Serevent.  6. Doxazosin 4 mg q.h.s.  7. Digoxin 0.125 mg q.d.  8. Reglan 10 mg p.o. q.a.c. and q.h.s.  9. Pepcid 20 mg q.d. 10. Flovent 110 mcg two puffs b.i.d. 11. Lopressor 25 mg p.o. q.d. 12. Lasix 40 mg p.o. q.d.  DISCHARGE INSTRUCTIONS:  Patient is to continue to take medicines as prescribed and reminded that it is very important to take the Reglan 10 mg before each meal and at bed.  DIET:  She is to follow a low salt, low fat diet.  FOLLOW-UP:  She is to keep her scheduled appointment in the outpatient clinic on February 04, 2000.  ADMITTING DIAGNOSES:  1. Nausea and vomiting and abdominal pain.  2. Rule out myocardial infarction.  3. Congestive heart failure.  4. Hypertension.  5. Insulin-dependent diabetes uncontrolled with gastroparesis.  6. Chronic renal insufficiency.  7. Asthma.  8. Gastroesophageal reflux disease.  9. Obesity. 10. Medical noncompliance.  HISTORY OF PRESENT ILLNESS:  This is a 60 year old African-American lady with CHF, an ejection fraction of 20%, an echo in 1997 secondary to dilated cardiomyopathy and hypertension, she also has poorly controlled diabetes, and she presented with a 12-hour history of nausea, vomiting, abdominal cramping. The patient took four times her usual Lasix dose on the day of admission because she was feeling swollen and subsequently developed nausea and vomiting, multiple episodes,  with abdominal pain.  She denies chest pain or shortness of breath and currently denies any nausea or abdominal pain.  She is very somnolent secondary to being awake all night and throwing up.  She does have a history of MI and diabetes and tobacco use.  HOSPITAL COURSE:  #1 - NAUSEA, VOMITING, ABDOMINAL PAIN:  Patient was admitted.  Serial enzymes were done.  Patient has chronically elevated troponins and CK-MBs and these were not significantly different during this admission so patient was ruled out for MI.  Fasting lipid panel was obtained.  Patients cholesterol was 341, triglycerides 37, HDL 88, and LDL 246.  She had an acute abdominal series which was negative for obstruction.  She had a urine drug screen which was negative, a UA which was negative, amylase which was 433, lipase which was 59 on admission and 44 on discharge, a digoxin level which was 0.8, transaminases were normal, albumin was 1.9, white count was 10.9, and hemoglobin was 14.2, and potassium was 3.7, magnesium was 2.1.  Patients weight on admission was 246 pounds, weight at discharge was 244 pounds.  She saturated greater than or equal to 94% on room air.  Patient was initially made n.p.o. and given IV antiemetics.  All of her medications were changed to IV form.  Patient was given gentle hydration until her nausea subsided.  Patients nausea subsided and eating the second hospital day and patient ate a hamburger and french fries for dinner and felt much better.  #2 - HYPERTENSION:  Patients blood pressures were elevated during this admission and needed better control as patient was on doxazosin and clonidine two sympatholytics and also that clonidine is a medication which causes ______ hypertension when discontinued.  Her clonidine was stopped and her doxazosin was increased to 4 mg p.o. q.h.s.  Also, as patient has history of MI and CHF but also asthma, we started her slowly on a beta blocker and discharged her on  Lopressor 25 mg p.o. q.d.  We also increased her ACE inhibitor to 20 mg p.o. b.i.d.  Her blood pressures remained stable during this admission and her creatinine and her asthma tolerated the changes in medication.  #3 - CONGESTIVE HEART FAILURE:  Patient did not have any evidence of congestive heart failure on this admission.  She remained well saturated on room air.  She did not have any crackles, no orthopnea.  She was always every time I saw her she would be laying flat on the bed without any pillows sleeping.  She had very little trace calf edema.  Chest x-ray showed no effusion, no infiltrate, her digoxin level was therapeutic and potassium was normal so she was maintained on her CHF regimen.  Patient was discharged home on hospital day #2 with directions as above.  She was stable at discharge with a white count of 7.7 and hemoglobin of 13.4, potassium of 3.6, greater than or equal to 94% on room air, weight of 244 and blood pressures remained stable. DD:  01/17/00 TD:  01/17/00 Job: [id number]

## 2010-07-11 NOTE — Discharge Summary (Signed)
NAME:  [name], [name]                           ACCOUNT NO.:  [id number]   MEDICAL RECORD NO.:  [id number]                   PATIENT TYPE:  INP   LOCATION:  5508                                 FACILITY:  [facility]   PHYSICIAN:  Aram Beecham B. Eliott Nine, M.D.             DATE OF BIRTH:  1951/01/18   DATE OF ADMISSION:  06/17/2003  DATE OF DISCHARGE:  06/20/2003                                 DISCHARGE SUMMARY   DISCHARGE DIAGNOSES:  1. Nausea and vomiting secondary to gastroparesis.  2. End stage renal disease requiring hemodialysis Tuesday, Thursday and     Saturday at [facility].  3. Diabetes.  4. Peripheral vascular atherosclerotic disease.  5. Congestive heart failure.  6. Hypertension.  7. Reflux disease.  8. Anemia.  9. Secondary hyperparathyroidism.   DISCHARGE MEDICATIONS:  1. PhosLo 30 p.o. q.a.c.  2. Seroquel 10 mg p.o. q.h.s.  3. Zocor 10 mg p.o. q.h.s.  4. Reglan 10 mg to be taken q.a.c. and q.h.s.  5. Wellbutrin or bupropion 100 mg p.o. b.i.d.  6. Insulin 70/30, 30 units q.a.m. __________ .   DISPOSITION:  Improved.   Estimated dry weight during hospitalization was 115 kg __________ dialysis.   PROCEDURES PERFORMED:  Acute abdominal series __________.   CONSULTATIONS:  None.   __________.   HOSPITAL COURSE:  1. Nausea and vomiting:  Differential diagnosis at the time of admission     included bowel ischemia secondary to peripheral vascular atherosclerotic     disease plus hemodialysis with some hypotension noted also gastroparesis     in this patient with known gastroparesis versus viral gastritis.  Patient     was admitted __________ treatment was changed.  She was initially taking     Reglan 20 mg q.a.c. and we changed it to 10 mg q.a.c., q.h.s.  By day     three the patient had good results and was tolerating solid p.o. without     any nausea and vomiting and therefore the cause of her nausea and     vomiting was thought to be secondary to  her gastroparesis and was told     that it was expected that her nausea and vomiting should continue to     improve with the medication change in the Reglan as previously mentioned.  2. End stage renal disease:  The patient was admitted, she was dialyzed once     during the hospitalization as consistent with her outpatient regimen.     She tolerated her hemodialysis well without any complication.  3. Insulin-dependent diabetes:  During the hospitalization, the patient's     regular regimen was withheld and the patient was given sliding scale     insulin given these changes in her p.o. intake.  She was instructed to     resume her regular insulin regimen once discharged from the hospital and     regularly taking p.o.  4. Constipation:  The patient was admitted and abdominal x-ray was noted to     have fecal impaction.  The patient was disimpacted and treated with Steri-     Vac, which resulted in large quantity of brown stool and thus resulted in     resolution of her constipation.  If this were to happen again, it may be     beneficial for the patient to take a regularly scheduled stool softener     to prevent further fecal impaction.  5. Hyperkalemia:  On admission it was noted that the patient had some     hyperkalemia and during the hospital course, the patient had potassium     checked intermittently that showed some hyperkalemia, but also showed     some hemolysis, and therefore it was not treated and so the patient was     noted to have a potassium of 6 on BMET that showed no hemolysis and     therefore, the patient was treated with Kayexalate 15 grams x 2 with good     results, resulted in a normal potassium.  Etiology of her hyperkalemia is     unknown and is even abnormal in the face of nausea, however, the patient     was discharged with an adequate potassium currently and this can continue     to be corrected during hemodialysis in the future.  6. Hypoxia:  On admission, it was  noted that the patient had a room air     saturation of 78%.  Etiology of this was unknown, therefore ABG was     checked that showed a pH of 7.4, pCO2 of 55, O2 of 85, bicarb of 32,     sating 93% on room air.  Given the lack of respiratory distress or any     other symptoms, no further work up was required.  It was suspected that     her oxygenation would improve with dialysis.   DISCHARGE LABORATORIES:  Sodium of 140, potassium 3.7, chloride of 94, CO2  of 29, glucose of 232, BUN of 78, creatinine of 7.8, calcium is 9.5.  Blood  cultures negative to date.   FOLLOW UP:  The patient is to follow up.  She is to return to the  hemodialysis as previously scheduled.  The patient is also to see Dr.  [name] as regularly scheduled due for her bilateral foot ulcers and recent  right great toe amputation.      [name], MD                         Duke Salvia Eliott Nine, M.D.    JH/MEDQ  D:  06/21/2003  T:  06/21/2003  Job:  [id number]   cc:   [facility]

## 2010-07-11 NOTE — H&P (Signed)
[name], [name]                 ACCOUNT NO.:  [id number]   MEDICAL RECORD NO.:  [id number]          PATIENT TYPE:  INP   LOCATION:  2027                         FACILITY:  [facility]   PHYSICIAN:  Benn Moulder, M.D.      DATE OF BIRTH:  11-Aug-1950   DATE OF ADMISSION:  04/05/2006  DATE OF DISCHARGE:                              HISTORY & PHYSICAL   CHIEF COMPLAINT:  Shortness of breath.   HISTORY OF PRESENT ILLNESS:  The patient is a 60 year old African  American female with past medical history significant for diabetes,  hypertension, and end-stage renal disease who presented to the emergency  department this evening with complaints of shortness of breath as well  as dry cough all day.  She also has had intermittent fevers throughout  the day as well but has been without rigors.  She receives hemodialysis  on Tuesday, Thursday, and Saturday, with her last hemodialysis this past  Saturday.  She denies any chest pain, nausea, vomiting, or orthopnea.  She is a nonsmoker.  She has not had any sick contacts.  She does have a  history of chronic cough being evaluated by Dr. Sherene Sires.  She was seen on  March 26, 2006, and chest CT was recommended but this has not been  done yet.  In the emergency department, a chest x-ray was done tonight  which showed pulmonary edema with focal airspace disease on the left.   REVIEW OF SYSTEMS:  No lightheadedness, dizziness, or hemoptysis.   PAST MEDICAL HISTORY:  1. End-stage renal disease with hemodialysis on Tuesday, Thursday,      Saturday by left arm AV graft.  2. Hypertension.  3. Diabetes.  4. GERD.  5. 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  6. Coronary artery disease.  7. Secondary hyperparathyroidism.  8. Anemia.  9. Anxiety.  10.Diabetic gastroparesis.   MEDICATIONS:  1. Hydroxyzine 25 mg one tablet p.o. p.r.n.  2. PhosLo 667 three tablets t.i.d. q.a.c.  3. Norvasc 10 mg p.o. q.h.s.  4.  Reglan 10 mg t.i.d. q.a.c. as well as q.h.s.  5. Protonix 40 mg daily.  6. Albuterol MDI p.r.n.  7. NPH 70/30, 30 units in the a.m. and 20 units in the evening.   ALLERGIES:  ENTERIC-COATED ASPIRIN.   FAMILY HISTORY:  Dad died with diabetes.  Mom, brother, and sister all  alive with hypertension.   SOCIAL HISTORY:  She lives with her mom.  No tobacco, alcohol, or other  drug use.  She is currently on disability.   PHYSICAL EXAMINATION:  VITAL SIGNS:  Temperature 101.0, pulse 98, blood  pressure 149/84, respiratory rate 26, oxygen saturation 95% on 3 liters.  GENERAL:  Alert, speaking in very short sentences.  HEENT:  Mucous membranes are moist.  Sclerae are anicteric.  NECK:  Supple, no JVD.  CARDIOVASCULAR:  Heart sounds distant.  Regular rate and rhythm.  No  murmurs appreciated.  LUNGS:  Decreased air movement at bases bilaterally, left greater than  right.  Somewhat tachypneic.  ABDOMEN:  Obese.  Positive bowel sounds.  Soft, nontender, nondistended.  EXTREMITIES:  Status post amputation of her first toes bilaterally.  Trace lower extremity edema.  AV graft in left forearm with positive  bruit.  NEUROLOGIC:  Alert and oriented x3.   RADIOLOGIC DATA:  Chest x-ray:  Cardiomegaly with pulmonary edema, focal  air space disease on the left with likely focal edema, although  infection cannot be excluded.   LABORATORY DATA:  Sodium 133, potassium 3.9, chloride 94, bicarb 24, BUN  46, creatinine 8.9, glucose 242.  White blood cell count 13.1,  hemoglobin 11.4, hematocrit 34.6, platelet count 361.   EKG showed normal sinus rhythm at 95 beats per minute with no ST segment  changes, borderline prolonged QRS.   ASSESSMENT/PLAN:  A 60 year old African American female with end-stage  renal disease now with shortness of breath, fever and cough.  1. Dyspnea due to pulmonary edema and likely pneumonia.  We will do      hemodialysis tonight.  For pulmonary edema, we will cycle cardiac       enzymes and check a brain natriuretic peptide and thyroid      stimulating hormone.  Consider a 2-D echocardiogram in the morning.      We will also consider obtaining chest CT while the patient is in      the hospital.  This is needed for her chronic cough and has not      been done yet.  The patient did receive azithromycin and      ceftriaxone in the emergency department.  Will start the patient on      by mouth Avelox tomorrow.  Will continue albuterol nebulizers as      needed.  2. End-stage renal disease.  Access is via a left forearm      arteriovenous graft.  Will do hemodialysis tonight, as above.      Renal as needed orders as well as Nephro-Vite.  Will obtain the      patient's hemodialysis records from [facility] in the      morning.  3. Diabetes.  Will cover with sliding scale insulin initially and then      restart the patient on her home NPH dosing.  4. Secondary hyperparathyroidism.  Will continue PhosLo with meals.      Will check hemodialysis records.  5. Anemia, stable.  Will start Aranesp once we receive the patient's      records from the dialysis center.  6. Chronic cough unrelated to current dyspnea.  As above, will      consider chest CT while the patient is here in the hospital.  7. Hypertension.  Continue Norvasc.  8. Gastroesophageal reflux disease.  Continue Protonix.  9. Diabetic gastroparesis.  Will continue Reglan.      Benn Moulder, M.D.     MR/MEDQ  D:  04/06/2006  T:  04/06/2006  Job:  [id number]

## 2010-07-11 NOTE — Assessment & Plan Note (Signed)
[facility]                             PULMONARY OFFICE NOTE   NANEA, JARED                        MRN:          [id number]  DATE:03/26/2006                            DOB:          1950-08-31    REASON FOR CONSULTATION:  Coughing fits.   HISTORY:  This is a 60 year old black female who states she is having  cough that comes and goes during the day more than the night for the  last year.  She says she has not been totally free of cough for a year.  However, she also has dyspnea just walking from one room into the other  at home.  She has already been tried on multiple inhalers and omeprazole  as well as metoclopramide, but says nothing works.  She denies any  excessive sputum production, fever, chills, sweats or any pattern to the  dyspnea with environmental weather change, and also no change in dyspnea  with or without dialysis.   PAST MEDICAL HISTORY:  Significant for the absence of atopy or asthma.  She does have a history of hypertension, CHF and diabetes, as well as  stroke per her report.  She has been on renal dialysis for 5 years.   ALLERGIES:  ASPIRIN CAUSES ITCHING.   MEDICATIONS:  Include both metoclopramide and omeprazole, but she is not  taking these consistently.   SOCIAL HISTORY:  She has never smoked.  She is unemployed and lives with  her mother who does all of her cooking.   FAMILY HISTORY:  Negative for asthma, atopy as recorded on her  worksheet.   REVIEW OF SYSTEMS:  Also reviewed from the worksheet with the patient.  Significant for depression in addition to the above complaints.   PHYSICAL EXAMINATION:  This is a morbidly obese black female whose  weight is now at 226 one day after dialysis (she says her baseline is  less than 200), and she has gained about another 25 pounds since the  onset of her present illness.  She has a hopeless, helpless affect and  attitude, can barely climb up on the exam table without  assistance.  HEENT:  Unremarkable. Oropharynx is clear.  NECK:  Supple without cervical adenopathy, tenderness.  Trachea is  midline.  No thyromegaly.  Lung fields are clear bilaterally to auscultation and percussion,  however, breath sounds are extremely distant.  Regular rate and rhythm.  Distant S1, S2.  ABDOMEN:  Massively obese but otherwise benign.  EXTREMITIES:  Warm without calf tenderness, cyanosis, clubbing, or  edema.   Chest x-ray showed massive cardiomegaly with deceased lung volumes and  crowding in the bases.   We walked the patient around the office, she only made it about 90 feet  before she desaturated and said she could not go any further.   IMPRESSION:  Morbid obesity is this patient's primary pulmonary  problem; however, most patients who are morbidly obese improve their gas  exchange with exercise.  She could have an occult interstitial process  or thromboembolic problem.  She tells me she is no better after  dialysis  in terms of her dyspnea, and has no peripheral edema, so I do not  believe cardiogenic pulmonary edema really fits the pattern.   The most important aspect of her care at this point is to be able to  reconcile medications which I was really uncomfortable with today.  I  would also strongly recommend that her medications be adjusted not to  include Norvasc which can promote reflux, and also reverse pulmonary  hypoxic vasoconstriction.   I would strongly recommend a CT scan of the chest be obtained, along  with a sinus CT scan at Dr. Hadley Pen discretion, if it is okay to  use contrast.  An uncontrasted study probably will not help.   In terms of immediate recommendations, I recommend that she take the  medicines she is already supposed to be on, but taken consistently.  I  reviewed this is writing with her along with a gastroesophageal reflux  disease diet (she had mint gum in her mouth so clearly is not following  a gastroesophageal reflux  disease program).  Specifically, I recommend  that she continue to take metoclopramide before meals and at bedtime, to  take omeprazole before both her first and her last meal, and then take a  6 day course of prednisone for any inflammatory components that may be  present.  If she is coughing, the best drug would be tramadol 50 mg 1  every 4 hours.  If she is wheezing and short of breath, she can use  albuterol 2 puffs every 4 hours.  I will arrange to see her back after  her scans are done, but defer the actual scheduling of the scans to Dr.  Arrie Aran to make sure she can, indeed, take the contrast, renal  perspective.     Charlaine Dalton. Sherene Sires, MD, [facility]  Electronically Signed    MBW/MedQ  DD: 03/26/2006  DT: 03/26/2006  Job #: [id number]   cc:   Terrial Rhodes, M.D.

## 2010-07-11 NOTE — Op Note (Signed)
NAME:  CHAPEL, SILVERTHORN                           ACCOUNT NO.:  [id number]   MEDICAL RECORD NO.:  [id number]                   PATIENT TYPE:  OIB   LOCATION:  2852                                 FACILITY:  [facility]   PHYSICIAN:  [name]. [name], M.D.        DATE OF BIRTH:  02/19/1951   DATE OF PROCEDURE:  DATE OF DISCHARGE:                                 OPERATIVE REPORT   PREOPERATIVE DIAGNOSIS:  Nonhealing wound of the left foot.   POSTOPERATIVE DIAGNOSIS:  Nonhealing wound of the left foot.   PROCEDURE:  1. Aortogram.  2. Left lower extremity run off.  3. Right lower extremity run off.   SURGEON:  [name]. [name], M.D.   ANESTHESIA:  Local with sedation technique.   The patient was taken to the [facility] lab at [facility] and sedated with a  milligram of Versed and 50 mcg of Fentanyl.  Both groins were prepped and  draped in the usual sterile fashion. After the skin was anesthetized with 1%  Lidocaine, the right common femoral artery was cannulated and the guidewire  introduced into the infrarenal aorta under fluoroscopic control.   A 5 French sheath was passed over the wire and the dilator was removed.  A  pigtail catheter was positioned over the L1 vertebral body and flush  aortogram obtained.  This catheter was then exchanged for an IMA catheter  which was positioned into the left common iliac artery.  Then an angle  Glidewire was then directed into the external iliac artery and then the IMA  catheter was exchanged for an end hole catheter which was positioned into  the left external iliac artery.  Left lower extremity run-off films were  obtained through this catheter.  Additional spot projections were obtained  in the leg and the lateral projection of the foot obtained.   The catheter was then removed and the right lower extremity run-off films  obtained to the right femoral sheath.   FINDINGS:  There were single renal arteries bilaterally with no significant  renal artery stenosis identified.  There is no significant disease of the  infrarenal, aorta, common iliac, or external iliac arteries.   On the left side the common femoral and deep femoral arteries were widely  patent.  There was mild diffuse disease of the superficial femoral artery  with moderate diffuse disease at the distal superficial femoral artery and  popliteal artery.  The popliteal artery is then occluded distally with  reconstitution of a short-diseased anterior tibial segment which is then  completely occluded.  The peroneal is occluded.  There is reconstitution of  the posterior tibial artery which is a very calcific vessel with diffuse  disease.  It is patent; however, to the ankle. There is significant disease  within the plantar vessels.   On the right side there is, again, a patent common femoral and deep femoral  artery with mild diffuse disease of  the superficial femoral artery and more  moderate diffuse disease of the distal superficial femoral artery and  popliteal artery.  The popliteal artery is then occluded distally with  single vessel run off via the posterior tibial artery.   CONCLUSIONS:  1. No significant aortoiliac occlusion.  2. 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                                               [name]. [name], M.D.    CSD/MEDQ  D:  10/27/2002  T:  10/27/2002  Job:  [id number]   cc:   Terrial Rhodes, M.D.  [address]  [place]  [place] [address]  Fax: [phone]   [facility] Lab at [facility]

## 2010-07-11 NOTE — Discharge Summary (Signed)
[facility]  Patient:    [name], [name] Visit Number: [id number] MRN: [id number]          Service Type: MED Location: [phone] Attending Physician:  Phifer, Trinna Post Dictated by:   Hillery Aldo, M.D. Admit Date:  12/17/2000 Discharge Date: 12/24/2000   CC:         Britt Bottom C. Lowell Guitar, M.D., renal service             Bonnell Public, M.D., [facility]                           Discharge Summary  DATE OF BIRTH:  Aug 02, 1950.  DISCHARGE DIAGNOSES:  1. Chronic renal insufficiency secondary to diabetic neuropathy status post     arteriovenous graft placement.  2. Hypertension.  3. Anemia.  4. Congestive heart failure.  5. Diabetes mellitus type 2.  6. Gastroparesis.  7. Chronic obstructive pulmonary disease/pulmonary hypertension.  8. Nausea/vomiting.  9. History of pericardial effusion. 10. History of cardiomyopathy (dilated, ischemic with an ejection fraction     of 25%). 11. History of cerebrovascular accident, 50. 12. Gastroesophageal reflux disease.  DISCHARGE MEDICATIONS:  1. Nephro-Vite one tablet p.o. q.d.  2. Lasix 80 mg one tablet p.o. q.d.  3. Albuterol, Atrovent, Serevent MDI inhalers.  4. Clonidine 0.2 mg t.i.d. p.o.  5. Pepcid 20 mg p.o. b.i.d.  6. Reglan 20 mg p.o. t.i.d. with meals.  7. PhosLo 667 mg p.o. t.i.d. with meals.  8. Phenergan 25 mg q.6h. p.r.n. nausea.  9. Carvedilol 6.25 mg p.o. b.i.d. 10. Insulin NPH 10 units q.a.m., 2 units q.p.m. 11. Digoxin 0.125 mg p.o. q.d.  FOLLOW-UP: 1. Mindi Slicker. Lowell Guitar, M.D., appointment at 10 a.m. on January 06, 2001. 2. Bonnell Public, M.D., at [facility] on    January 14, 2001, at 1:45 p.m.  PROCEDURES AND DIAGNOSTIC STUDIES: 1. Status post left forearm AV graft per Caralee Ates, M.D. 2. Chest x-ray on December 17, 2000:  Stable, marked cardiac enlargement.    No acute pulmonary process identified. 3. Transthoracic  echocardiogram on December 20, 2000, by Ricki Rodriguez, M.D.,    for follow-up of pericardial effusion.  Summary of findings:    a. Left ventricle was mildly dilated.  Overall left ventricular systolic       function was mildly decreased.  Left ventricular ejection fraction was       estimated, ranging between 40% to 50%. There was mild hypokinesis of the       basal-mid septal wall.    b. The aortic valve was mildly calcified.    c. 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 There was mild mitral       valve regurgitation.    d. The left atrium was mildly dilated.    e. There was moderate tricuspid valvular regurgitation.    f. There was a minimal pericardial effusion, without hemodynamic       compromise. 4. EKGs done on December 17, 2000, showed normal sinus rhythm, and sinus    tachycardia. There were ST and T-wave abnormalities consistent with    lateral ischemia.  No obvious infarct.  His EKG findings were consistent    with EKG findings on her previous admission, and were not acute.  CONSULTANTS: 1. Dr. Elyn Peers of cardiovascular thoracic surgery was  consulted for placement of    an AVG graft. 2. Dr. Lowell Guitar was consulted for management of her renal care.  ADMISSION HISTORY AND PHYSICAL:  Ms. Titus is a 60 year old black female who was admitted on December 18, 2000, with chief complaint of nausea and vomiting for one days duration. The patient has a history of chronic renal disease, ischemic cardiomyopathy, type 2 diabetes, dyslipidemia.  The patient reported that she could not hold liquids down for the 24 hours previous to admission. She denied fever, chills, or diarrhea.  The patient reports fairly significant wretching with abdominal pain.  The patient denied any hematemesis and melena. The patient reported that she had a history of gastroparesis.  The patients baseline creatinine was 3.8 in May 2002.  At that  time, the patient also had 12 g proteinuria.  The patients vital signs on admission were temperature 98.9, pulse 102, respiratory rate 22, blood pressure 209/103.  The patients admission lab results showed a white blood cell count of 12.5, red blood cells count of 3.7, hemoglobin of 11.0, hematocrit 33.4, MCV 89.2, MCHC 32.8, RDW 15.6, and platelets of 306.  The differential showed a neutrophilia with 91% neutrophils, 11.4% absolute neutrophils, 8% lymphocytes, 1% absolute lymphocytes, 1% monocytes, 0.1% absolute monocytes, and zero eosinophils and basophils.  The patients admission UA showed a pH  of 6, glucose 500, moderate hemoglobin, negative bilirubin, 15 ketones, greater than 300 proteins, 0.2 urobilinogen, and was negative for nitrites and leukocyte esterase.  There were 0 to 2 wbc, and 3 to 6 rbc.  There were a few bacteria seen, and some granular casts noted.  A urine culture was negative.  TSH was 1.150.  Cardiac enzymes were done and her initial CK was 425, CK-MB 5.2, relative index 1.2, and troponin 0.07.  These enzymes were repeated eight hours later at which time her CK was 457, CK-MB 6.8, relative index 1.5 and troponin 0.08.  The next set of enzymes found a CK of 446, CK-MB of 6.9, relative index 1.5, and troponin I 0.13.  A final set of enzymes done on December 18, 2000, at 1620 showed a CK of 424, CK-MB of 7.2, relative index 1.7, troponin of 0.11.  Her digoxin level was therapeutic on admission at 1.3. Her admission chemistries showed a sodium of 146, potassium 3.8, chloride 115, glucose 107, BUN 60, creatinine 7.0, calcium 7.7, total protein 7.0 and albumin of 2.8.  HOSPITAL COURSE:  #1 - NAUSEA AND VOMITING:  Resolved; the patient was admitted and received gentle hydration with D5 normal saline, and antiemetics.  Her lipase was found to be within normal limits as were her liver function studies. The patient was not considered to be uremic. The patient quickly improved  clinically with hydration, and is currently tolerating p.o.s without any difficulty.   #2 - CHRONIC RENAL INSUFFICIENCY SECONDARY TO DIABETIC NEUROPATHY:  The patient had adamantly refused hemodialysis in the past; however, the patient agreed to placement of an AV graft per Dr. Lowell Guitar on this admission, and a renal consultation was obtained.  Cardiovascular thoracic surgery was consulted for placement of the graft. The patient is currently postop day #1, with no complications.  The patient has an appointment for follow-up with Dr. Lowell Guitar as stated previously.  #3 -  HYPERTENSION:  The patient was hypertensive on admission.  Ultimately, hemodialysis will best treat her hypertension.  We cannot treat the patients hypertension with an ACE inhibitor secondary to her creatinine being greater than 4.0.  We will  discharge her on Lasix, carvedilol and clonidine.  #4 - ANEMIA:  The patients hemoglobin and hematocrit slowly trended downward during her hospitalization.  This may be secondary to her renal disease.  The patient has follow-up with Dr. Lowell Guitar and will be worked up as an outpatient for treatment of her anemia.  #5 - CONGESTIVE HEART FAILURE: The patient has systolic dysfunction with ischemia and dilated cardiomyopathy.  Echocardiogram done this admission revealed her ejection fraction to be in the 40 to 50% range, with findings as previously described.  Her TSH was within normal limits.  The patient will be discharged home on aspirin, carvedilol, and digoxin to address this issue.  #6 - ELEVATED CARDIAC ENZYMES:  This appears to be chronic in nature and is most likely secondary to renal failure. The patient has had slight ST segment depression in her lateral leads but this is not a new finding.  No further work-up is warranted at this point.  #7 - DIABETES MELLITUS TYPE 2: The patient is well controlled on her current regimen of NPH insulin.  #8 - GASTROPARESIS:  This is  secondary to her diabetes.  The patient has previously had an upper GI study done in the past and has been treated for this with Reglan with good control of her symptoms.  #9 - CHRONIC OBSTRUCTIVE PULMONARY DISEASE/PULMONARY HYPERTENSION:  This issue was stable during her admission.  DISCHARGE LABS:  CBC:  Hemoglobin 8.2, hematocrit 24.2, wbc 9.9, platelets 252.  Chemistries:  Sodium 140, potassium 3.8, chloride 110, bicarb 22, BUN 71, creatinine 6.7, glucose 162. Dictated by:   Hillery Aldo, M.D. Attending Physician:  Phifer, Trinna Post DD:  12/24/00 TD:  12/27/00 Job: [id number]

## 2010-07-11 NOTE — Op Note (Signed)
NAME:  [name], [name]                           ACCOUNT NO.:  [id number]   MEDICAL RECORD NO.:  [id number]                   PATIENT TYPE:  INP   LOCATION:  2887                                 FACILITY:  [facility]   PHYSICIAN:  [name]. [name], M.D.        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   POSTOPERATIVE DIAGNOSIS:  Right infrainguinal occlusive arterial disease  with right foot rest pain.   PROCEDURE:  Right femoral to below knee popliteal artery bypass graft with a  non-reversed translocated saphenous vein graft.   SURGEON:  [name]. [name], M.D.   ASSISTANT:  Jerold Coombe, P.A.   ANESTHESIA:  General.   INDICATIONS FOR PROCEDURE:  This is a 60 year old woman who had presented  with progressive ischemia of her right lower extremity.  Given her diabetes  and renal insufficiency, she had markedly calcified vessels and I felt that  she was high risk for revascularization and we could potentially make things  worse with revascularization.  However, her pain in her foot progressed to  the point where her options were either below the knee amputation or  attempted revascularization.  After extensive discussion with the patient,  she elected to proceed with revascularization.  We discussed the potential  complications of surgery including but not limited to bleeding, wound  healing problems, graft thrombosis, limb loss, MI or other unpredictable  medical problems.  All of her questions were answered and she was agreeable  to proceed.   SURGICAL TECHNIQUE:  The patient was taken to the operating room and  received a general anesthetic.  The right lower extremity and groin were  prepped and draped in the usual sterile fashion.  Her pannus had been taped  superiorly.  I  made a longitudinal incision over the saphenous vein below  the knee and the vein here was dissected free with branches divided between  clips and 3-0 silk ties.  It was an adequate size vein.  Through the same  incision, the posterior tibial artery was exposed.  It was markedly  calcified and rock hard and there was no area along the posterior tibial  area that appeared to be sewable.  I exposed the tibial peroneal trunk and  here, too, there was not any area that was amenable to sewing.  The only  spot that I could find that was at all reasonable was a short segment of the  below knee popliteal artery.  Therefore, the below knee popliteal artery was  exposed.  Next, an oblique incision was made in the right groin where the  common femoral artery was dissected free and it was soft and had a nice  pulse.  Through the same incision, the saphenofemoral junction was dissected  free.  Through three additional incisions along the medial aspect of the  right leg, the saphenous vein was harvested down to the mid calf.  Again,  branches were divided between clips and 3-0 silk ties.  The vein was ligated  distally and then removed through the tunnels and then the saphenofemoral  junction was clamped with a Cooley clamp.  The saphenous vein was removed  from the femoral vein and then the femoral vein was oversewn with a 5-0  Prolene suture.  The proximal valve was sharply excised.  Hemostasis was  obtained in the wound.   The patient was then heparinized after a tunnel was created from the below  knee incision to the groin incision.  The common femoral artery was then  clamped proximally and distally and a longitudinal arteriotomy was made.  The saphenous vein was sewn end-to-side in a non-reversed fashion to the  femoral artery using continuous 6-0 Prolene suture.  Prior to completing the  anastomosis, the vessels were back bled and flushed appropriately and the  anastomosis completed.  The flow  was re-established.  The vein was irrigated  with heparinized saline and then the valves were sharply excised with a  retrograde Mills valvulotome.  Excellent flow was established through the  graft.  The graft was then marked to prevent twisting.  It was then brought  through the previously created tunnel.  A tourniquet was placed on the  thigh, the leg was exsanguinated with an Esmarch bandage, and the tourniquet  inflated to 350 mmHg.  Despite the tourniquet, there was bleeding proximally  and distally and I had to clamp the artery.  The vein graft was cut to the  appropriate length, spatulated, and sewn end-to-side to this markedly  diseased popliteal artery using two continuous 6-0 Prolene sutures.  Prior  to completing the anastomosis, the vessels back bled and flushed  appropriately, and the anastomosis completed.  The tourniquet was released  and flow re-established to the right leg.  There was a posterior tibial  signal at the completion.  Hemostasis was obtained in the wounds.  A 15  Blake drain was placed in the distal incision and the proximal incision.  The incisions were then irrigated with saline and hemostasis obtained.  The  wounds were closed with a deep layer of 3-0 Vicryl and the skin was closed  with staples.  The groin incision was closed with a 4-0 subcuticular stitch.  A sterile dressing was applied.  The patient tolerated the procedure well  and was transferred to the recovery room in satisfactory condition.  All  needle and sponge counts were correct.                                               [name]. [name], M.D.    CSD/MEDQ  D:  03/27/2003  T:  03/27/2003  Job:  [id number]

## 2010-07-11 NOTE — Discharge Summary (Signed)
[place]. [facility]  Patient:    [name], [name] Visit Number: [id number] MRN: [id number]          Service Type: MED Location: [id number] Attending Physician:  Farley Ly Dictated by:   Sanda Linger, MS Admit Date:  01/04/2001 Discharge Date: 01/09/2001                             Discharge Summary  DATE OF BIRTH:  Dec 15, 2050  HOME ADDRESS AND PHONE NUMBER:  [address].  [place], [place] [address].  Phone number [phone].  PROBLEM LIST: 1. End-stage renal disease with new hemodialysis requirement. 2. Hypertension. 3. Insulin-dependent diabetes mellitus type 2. 4. Anemia. 5. Dilated cardiomyopathy. 6. Asthma.  ADMISSION DIAGNOSES: 1. Hypertensive crisis. 2. Nausea and vomiting. 3. End-stage renal disease. 4. Insulin-dependent diabetes mellitus. 5. Anemia. 6. Dilated cardiomyopathy. 7. Asthma.  PAST MEDICAL HISTORY: 1. Diabetes mellitus type 2.  The patient is insulin dependent, and has    history of poor control. 2. Dilated cardiomyopathy. 3. Pulmonary hypertension. 4. Pericardial effusion. 5. Hypertension. 6. Coronary artery disease, history of myocardial infarction x 3. 7. Cerebrovascular accident in 1998. 8. Gastroesophageal reflux disease. 9. Gastroparesis secondary to insulin-dependent diabetes.  DISCHARGE MEDICATIONS:  1. Coreg 6.25 mg p.o. b.i.d.  2. PhosLo 667 mg p.o. t.i.d.  3. Klonodine 0.2 mg p.o. t.i.d.  4. Protonix 40 mg p.o. q.d.  5. Digoxin 0.125 mg p.o. q.d.  6. Atrovent metered dose inhaler 2 puffs q.i.d.  7. Albuterol metered dose inhaler 2 puffs q.i.d.  8. Reglan 10 mg p.o. q.a.c.  9. Insulin 70/30 20 units subcutaneously q.a.m. and 10 units subcutaneously     q.p.m. 10. Nephro-Vite vitamin one p.o. q.d. 11. Methylchloride topical spray apply to graft arm prior to vein puncture.  FOLLOW-UP APPOINTMENTS: 1. The patient is to have chronic hemodialysis at [facility] at 11:30  p.m. on Tuesday, Thursday, and Saturdays. 2. Internal medicine clinic will call to schedule a follow-up appointment for    early this week.  PROCEDURES: 1. PA and lateral of chest on 01/04/01:  Showing massive globular heart with    contour suspicious for a pericardial effusion and associated mild edema. 2. Acute abdomen on 01/04/01:  Abdominal bowel gas pattern is unremarkable. 3. Abdominal ultrasound on 01/04/01:  Showing unremarkable abdominal sonogram    with poor visualization of pancreas, abdominal aorta, and no evidence of    gallstones. 4. Portable chest x-ray on 01/06/01:  Showing congestive heart failure. 5. Portable chest x-ray on 01/07/01:  Showing an unchanged chest x-ray with    marked enlargement of the pericardial silhouette. 6. A 2-D echocardiogram on 01/07/01:  Showing a technically difficult and    limited study with a small pericardial effusion that is not hemodynamically    significant.  Left ventricular dysfunction with akinesis of inferior and    posterior walls are noted, but studies not adequate to assess overall    function or estimate ejection fraction.  CONSULTATIONS: 1.  [facility] was consulted secondary to end-stage renal disease. 2. Pulmonary critical care medicine was also consulted during the medical    intensive care unit stay for the patient.  HISTORY OF PRESENT ILLNESS:  A 60 year old African-American female who presents to the emergency department complaining of abdominal pain for approximately 3 days and nausea and vomiting that began this a.m.  She states no problems since her last  discharge from the hospital on 12/24/00.  She has emesis without blood or coffee grounds, stating only a yellowish color.  She has no headaches or blurred vision.  SOCIAL HISTORY:  The patient lives in [place].  She does not smoke cigarettes, does not use alcohol, and does not use any intravenous drugs or any other illicit drugs.  FAMILY HISTORY:   Noncontributory.  ALLERGIES:  CODEINE, CAPTOPRIL.  MEDICATIONS:  1. Nephro-Vite one tab p.o. q.d.  2. Lasix 80 mg p.o. q.d.  3. Albuterol/Atrovent/Serevent metered dose inhalers.  4. Klonodine 0.2 mg p.o. t.i.d.  5. Pepcid 20 mg p.o. b.i.d.  6. Reglan 20 mg p.o. t.i.d. with meals.  7. PhosLo 667 mg p.o. t.i.d. with meals.  8. Phenergan 25 mg p.o. q.6h. p.r.n.  9. Carvedilol 6.25 mg p.o. b.i.d. 10. Digoxin 0.125 mg p.o. q.d.  REVIEW OF SYSTEMS:  Per HPI.  PHYSICAL EXAMINATION:  VITAL SIGNS:  Temperature 97.0, pulse 98, respiratory rate 22, blood pressure 240/103, oxygen saturation 99% on room air.  GENERAL:  African-American female laying in bed in moderate distress complaining of abdominal pain nausea and vomiting.  HEENT:  Pupils are equal, round and reactive to light.  Extraocular movements intact.  Oropharynx clear.  CARDIOVASCULAR:  Regular rate and rhythm, no murmurs, rubs, or gallops.  RESPIRATORY:  Clear to auscultation bilaterally.  No crackles, wheezes, or rhonchi.  ABDOMEN:  Obese, mildly tender, no guarding, no rebound, decreased bowel sounds.  EXTREMITIES:  No clubbing, cyanosis, or edema.  LABORATORY DATA:  White blood cell count 12.2, hemoglobin 10.3, platelets 354, MCV 90.  Sodium 141, potassium 4, chloride 114, bicarbonate 17, BUN 68, creatinine 6.4, glucose 188, calcium 7.9.  Alkaline phosphatase 113, total bilirubin 0.7, AST 18, ALT 20, total protein 7.0, albumin 3.0.  Amylase 283, lipase 61.  HOSPITAL COURSE:  #1 - HYPERTENSIVE CRISIS:  The patient was admitted to the intensive care unit and placed on a labetalol drip overnight with pressure controlled in the systolic range 170 to 180.  Labetalol was discontinued at approximately 11 a.m. the following day, and the patient was restarted on carvedilol 6.25 mg p.o. b.i.d. with a klonodine 0.3 mg patch applied previously on admission. Her nausea and vomiting resolved while she was on the labetalol  drip with no  complaints of abdominal pain.  The patients blood pressure continued to be well controlled, and she was transferred to a floor bed.  Throughout the remainder of the hospitalization, her blood pressure remained under fair control.  She did have two episodes of hypertension up to approximately 210 systolic after her hemodialysis.  This seemed to improve with subsequent hemodialysis.  The patient will need to watch her blood pressure immediately after hemodialysis in the future.  #2 - END-STAGE RENAL DISEASE:  After being transferred to the floor, the patient developed asterixis and new onset nausea and vomiting.  A renal consult was obtained at this time.  The renal consult recommended the initiation of hemodialysis.  She underwent hemodialysis three times while being hospitalized.  Her nausea and vomiting resolved after the first hemodialysis.  It was recommended by renal to continue chronic hemodialysis therapy as an outpatient.  #3 - INSULIN-DEPENDENT DIABETES MELLITUS TYPE 2:  The patient was initially covered on sliding scale insulin.  Once she was able to eat with well controlled blood pressure she was converted back to her home dose of 70/30 with 20 units in the morning and 10 units at night.  Her capillary blood glucoses ranged  from the low 100s to approximately 210.  The patient has a glucometer at home and understands how to use the machine.  She was encouraged to check her glucose levels as an outpatient, and keep a chart for aid in the future management of her insulin regimen.  #4 - ANEMIA OF CHRONIC DISEASE:  The patient has a ferratin of 199, iron 37, TIBC 175, 21% saturation.  She was transfused once during hospitalization one unit secondary to renal recommendations with initiation of hemodialysis.  She is receiving erythropoietin with her hemodialysis.  #5 - DILATED CARDIOMYOPATHY:  The patient has a known congestive heart failure with an ejection fraction  of 23%.  She had a 2-D echocardiogram while she was in the hospital secondary to question of a pericardial effusion.  She does have a small, but hemodynamically insignificant pericardial effusion.  The patient was noted also to have oxygen saturations ranging from 88 to 99% on room air throughout hospitalization.  These oxygen saturations were usually running in the low 90s.  On the last day of hospitalization she was given a 5 minute walk during which time her oxygen saturation dropped between 82 and 85% with a 5 minute walk.  Her pre-walk oxygen saturation was 94%.  Her oxygen saturation quickly rose back up to 94% with rest after the walk.  She did note some shortness of breath with her walk, but had no other symptoms.  #6 - ASTHMA:  The patient did not have wheezing while she was in the hospital. Her asthma/COPD was stable during hospitalization.  She will return to her home medications on discharge.  DISCHARGE VITAL SIGNS AND LABORATORY DATA:  Temperature 98.9, pulse 59, respiratory rate 18, blood pressure 140/72, oxygen saturation 94% on room air. Sodium 140, potassium 3.7, chloride 106, bicarbonate 28, BUN 45, creatinine 5.5, glucose 168, calcium 6.9 (8.5 corrected), phosphorus 4.5.  PENDING LABORATORY DATA:  None.  RESIDENT:  Bonnell Public, M.D.  ATTENDING:  Farley Ly, M.D. Dictated by:   Sanda Linger, MS Attending Physician:  Farley Ly DD:  01/09/01 TD:  01/10/01 Job: [id number]

## 2010-07-11 NOTE — Discharge Summary (Signed)
NAME:  [name], [name]                           ACCOUNT NO.:  [id number]   MEDICAL RECORD NO.:  [id number]                   PATIENT TYPE:  INP   LOCATION:  5508                                 FACILITY:  [facility]   PHYSICIAN:  Maree Krabbe, M.D.             DATE OF BIRTH:  1950-08-26   DATE OF ADMISSION:  07/07/2003  DATE OF DISCHARGE:  07/16/2003                                 DISCHARGE SUMMARY   ADMITTING DIAGNOSES:  1. Right great toe gangrene.  2. Nausea and vomiting.  3. Diabetes mellitus, type 2, requiring insulin.  4. Peripheral vascular disease, status post right femoral bypass, left     femoral bypass, and left great toe amputation.  5. Anxiety and depression.  6. Hyperkalemia.  7. Iron deficiency anemia.  8. Secondary hyperparathyroidism.   DISCHARGE DIAGNOSES:  1. 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  2. MRSA on wound culture of right great toe.  3. Diabetes mellitus, type 2, requiring.  4. Peripheral vascular disease, status post right femoral bypass, left     femoral bypass, and left great toe amputations.  5. Anxiety and depression.  6. Hyperkalemia, resolved.  7. Iron deficient.  8. Secondary hyperparathyroidism.   CONSULTATIONS:  On Jul 07, 2003, Dr. [name].  Subsequently, recommended  right great toe amputation.   PROCEDURES:  1. Hemodialysis.  2. On Jul 11, 2003, Dr. Darrick Penna amputated the right great toe.  3. On Jul 07, 2003, plain film of right foot could not exclude osteomyelitis     at the lateral aspect tuft of the distal phalanx.  4. On Jul 10, 2003, shuntogram of the left upper arm A-V graft due to     increased venous pressures revealed:  1.  A venous anastomotic stenosis     that responded well with angioplasty.  2.  Showed no central venous     occlusion or stenosis.   BRIEF HISTORY:  Ms. Heckart is a 60 year old black woman who receives  hemodialysis Tuesdays, Thursdays and Saturdays at [facility], with a known history of peripheral vascular disease who presented to  the [facility] Emergency Room with a 24 hour history of nausea, vomiting,  epigastric abdominal pain, and a malodorous right toe.  She was admitted for  GI rest, IV Reglan, and further evaluation of the right necrotic toe to rule  out osteomyelitis.   HOSPITAL COURSE:  1. Malodorous right great toe.  Dr. [name], who knows the patient well,     recommended with IV antibiotics but recommended probable need for toe     amputation.  She received only six days of Zosyn 2.25 grams IV q.8h.  Due     to she was found to be a new MRSA , she also received Bactroban to each     naris b.i.d., staring on Jul 12, 2003.  Blood cultures were obtained,  upon presentation to the emergency room.  Wound culture and Gram stain of     right great toe were obtained.  Blood cultures were negative.  However,     wound culture was positive for MRSA and vancomycin was added to the     Zosyn.  She received three doses of vancomycin, during this     hospitalization, that included vancomycin 2 grams x 1, vancomycin 750 mg     x 1, and vancomycin 500 mg x 1.  Due to the plain film appearing to be a     possible osteomyelitis, Dr. Darrick Penna performed a right great toe amputation     under an ankle block, on Jul 11, 2003.  She was originally scheduled for     Jul 09, 2003, but due to a potassium of 6.3, it was cancelled until Jul 11, 2003.  Post-op day #1, had Xeroform gauze dressing to surgical site.     Home health nursing was arranged for dressing changes at home.  She was     getting around well and, on Jul 16, 2003, Dr. [name] felt that she could     be discharged from a surgical standpoint.  2. Nausea, vomiting, abdominal pain.  The patient's symptoms began to     resolve with clear liquids, IV Reglan, and PPI and was resolved by     hospital day #3.  Her diet was advanced.  3. Hyperkalemia.  Upon admission, potassium was found to  be 6.2.  An     emergent hemodialysis with a 0K dialysate bath x 1 hour, then she was     changed to a 2K standard bath for the remainder of her total four hour     treatment.  Blood flow rates were less than optimal at 300.  Venous     pressures were elevated.  Potassium, the following day, on the 15th, was     6.1 and continued to rise, on the 16th, at 6.3.  On day after admission,     her left A-V graft had a barely audible bruit.  Shuntogram was performed,     on Jul 10, 2003, which revealed a venous anastomotic stenosis that     responded well with angioplasty.  For more details, see above procedures.     Had hemodialysis on the 17th, after the shuntogram, venous pressures,     arterial pressures, and blood flow rates were appropriate and again did     well with dialysis for the remainder of her hospital stay.  On Jul 14, 2003, potassium was 5.0.  4. Iron deficient anemia.  Hemoglobin throughout hospital stay was on     average greater than 13.3.  Epogen was not given.  Epogen is not given to     her as an outpatient.  However, we continued her outpatient dose of InFeD     100 mg IV every week.  5. Secondary hyperparathyroidism.  She does not receive, as an outpatient, a     vitamin analog due to a continued increased product; however, she did     receive Calcijex 0.75 mcg IV every treatment x 2.  Average calcium,     during this hospitalization, was a high 8's, low 9's.  Average     phosphorous high 7's.  Average albumin mid 3's.  She was given PhosLo two     with meals.  Throughout entire hospitalization,  6. Diabetes  mellitus, type 2- requiring insulin.  CBGs were very well     controlled during the hospitalization.  Average a.m. CBG was 100, average     evening CBG was 160.  She received 70/30 insulin 15 units subcu b.i.d. as     well as sliding scale sensitive Novolog insulin. 7. End-stage renal disease.  Please see hospital course, hyperkalemia.   DISPOSITION:  1. Insulin  70/30, 30 units in the morning, 20 units in the evening.  2. PhosLo 667 mg three with meals.  3. Seroquel 100 mg p.o. q.h.s.  4. Zocor 20 mg p.o. q.h.s.  5. Wellbutrin 100 mg p.o. b.i.d.  6. Reglan 10 mg prior to each meal and bedtime.  7. Vancomycin 750 mg IV every hemodialysis treatment x 6.  8. Epogen 500 units IV every treatment.  9. InFeD 100 mg IV every week.  10.      Vicodin 5/500 one to two tabs every six hours as needed,     prescription for #60 given to the point prior to discharge.   ACTIVITY:  As tolerated, per physical therapy and CVTS.   DIET:  An 80/2/2 carbohydrate modified diet.   WOUND CARE:  Per home health R.N. and CVTS.   SPECIAL INSTRUCTIONS:  For R.N. at dialysis unit:  No change in dry weight.  Check random vancomycin trough, pre-treatment Jul 19, 2003.  MRSA  precautions.   FOLLOWUP:  The patient understands to call the CVTS in the morning, on Jul 17, 2003, for office followup in ten days with Dr. [name] and Ms. Millon  understands to resume her Tuesday, Thursday and Saturday hemodialysis  schedule at [facility].      Hillery Hunter, PA                   Maree Krabbe, M.D.    MJG/MEDQ  D:  08/09/2003  T:  08/11/2003  Job:  [id number]   cc:   [name]. [name], M.D.  [address]  [place]  [place] [address]   [facility]   [facility]

## 2010-07-11 NOTE — Discharge Summary (Signed)
[facility]certain. [facility]  Patient:    [name], [name]                        MRN: [id number] Adm. Date:  [id number] Disc. Date: [id number] Attending:  Anastasio Auerbach CC:         Leory Plowman, M.D., internal medicine  Mindi Slicker. Lowell Guitar, M.D., nephrology  Colleen Can. Deborah Chalk, M.D., cardiology   Discharge Summary  DISCHARGE DIAGNOSES:  1. Congestive heart failure presenting with progressive shortness of breath,     anasarca.  2. History of dilated cardiomyopathy with an estimated ejection fraction of     20% per a study in 1997.  3. Elevated cardiac enzymes with a peak troponin I of 0.93 attributed to     congestive heart failure.  4. Chronic renal insufficiency with nephrotic syndrome, 24-hour urine     revealing 12.6 g of protein, estimated creatinine clearance of 35.  5. Poorly controlled insulin-dependent diabetes mellitus with a glycosylated     hemoglobin of 11.9 on admission.  6. Severe gastroparesis also supported by an upper GI his admission showing     moderate to severe delay in emptying.  7. Coronary artery disease, history of myocardial infarction in 1998 in     [place] with evidence of reversible ischemia in     January 1996.  8. History of cerebrovascular accident in 1998 with decreased left lower     extremity strength a couple of weeks after her heart attack.  9. Reactive airways disease. 10. Gastroesophageal reflux disease. 11. Morbid obesity. 12. Allergy to CODEINE. 13. Bronchitis treated with a course of azithromycin. 14. Medical noncompliance.  DISCHARGE MEDICATIONS:  The patient will be discharged on the following:  1. Digoxin 0.125 mg p.o. q.d.  2. Baby aspirin 81 mg two p.o. q.d.  3. Reglan 10 mg a.c. and h.s. (20 minutes prior to meals and bedtime).  4. Pepcid 20 mg p.o. b.i.d.  5. Nephro-Vite one p.o. q.d.  6. Norvasc 10 mg p.o. q.d.  7. Lasix 80 mg p.o. q.d. (advised to take an additional dose if weight  increases more than 3 pounds).  8. Clonidine 0.1 mg p.o. b.i.d.  9. Lotensin 20 mg p.o. q.d. 10. Insulin 70/30 20 units q.a.m. 4 units q.p.m. 11. Humibid L.A. 600 mg two pills q.a.m., two p.o. q.p.m. x 7 days. 12. Tessalon Perles one p.o. t.i.d. p.r.n. cough. 13. Albuterol nebulizer 2.5 mg q.6h. x 7 days, then Serevent MDI two puffs     b.i.d. 14. Flovent inhaler 110 mcg with spacer two puffs b.i.d.  DISCHARGE INSTRUCTIONS:  Diet:  Advised a 2400-calorie diabetic diet, 2-gram sodium restriction.  Advised total fluid restriction to 48 ounces a day.  The patient was advised to record daily weights and take additional Lasix if indicated as noted above to monitor her blood glucose.  FOLLOWUP:  Follow-up appointments with Dr. Viviann Spare in [facility] Friday, November 9 at 9:30 a.m.  Recommend follow-up of renal function with a basic metabolic set, magnesium, digoxin level.  REASON FOR ADMISSION:  The patient is a 60 year old African-American female with the above-mentioned medical problems, notably a history of dilated cardiomyopathy, poorly controlled diabetes, hypertension, chronic renal insufficiency, and medical noncompliance who presented with progressive shortness of breath and anasarca deemed to be in congestive heart failure exacerbation.  HOSPITAL COURSE: #1 - CONGESTIVE HEART FAILURE EXACERBATION WITH MASSIVE HYPOVOLEMIA AND ANASARCA:  The patient was admitted at a  weight of 337 pounds and discharged at a weight of 250 pounds consistent with approximately 80 pounds of diuresis. She was aggressively diuresed initially with IV diuretics and then transitioned to an oral regimen including both metolazone and Lasix and ultimately discharged on Lasix 80 mg p.o. q.d.  During brief periods of her hospitalization her diuretics were held secondary to worsening azotemia.  Her admission renal function was BUN of 35, creatinine of 2.6.  Discharge BUN of 22 and creatinine of 2.4.   With respect to her heart failure management, she was continued on digoxin and Lotensin up to 20 mg q.d.  The Lotensin was briefly held and then resumed at the end of her hospitalization secondary to worsening azotemia.  Once she is deemed to be on a stable diuretic regimen without any evidence of congestive heart failure exacerbation, would recommend trying to initiate a beta-blocker for her heart failure.  #2 - CHRONIC RENAL INSUFFICIENCY WITH EVIDENCE OF NEPHROTIC SYNDROME:  As noted above, her admission BUN and creatinine were 22/2.4.  Dr. Lowell Guitar of nephrology was invited to consult given evidence of marked proteinuria, and a 24-hour urine protein revealed 12.6 g protein with a creatinine clearance of approximately 35, negative for Bence Jones proteins.  Additional workup revealed a nonspecific SPEP consistent with inflammatory changes, negative ANA, complement of C3 and C4 were normal.  Renal ultrasound was also normal with the right kidney measuring 12.1 cm and left 11.9 cm.  The renal service did feel that she ultimately will be a dialysis candidate.  Her renal failure is probably due to a combination of both her diabetes and hypertension.  #3 - ELEVATED CARDIAC ENZYMES:  The patient had a peak troponin of 0.93. Serial enzymes were obtained with CK of 304 and CK-MB of 8.2.  There was no evidence of acute coronary syndrome per EKG, and she did not complain of any specific chest pain symptoms.  Her elevated enzymes were attributed to her CHF exacerbation.  The patient does have a history of coronary artery disease. She was continued on a baby aspirin, and again as noted above, may benefit from beta-blockade once her heart failure is well controlled.  #4 - BRONCHITIS:  The patient had a productive cough of greenish sputum in the midst of her hospitalization which responded rapidly to five-day course of azithromycin.  A current chest x-ray was only consistent with pulmonary  edema  and did not show any focal infiltrates consistent with pneumonia.  #5 - REACTIVE AIRWAYS DISEASE:  The patient was treated with albuterol nebulizers as well as Serevent.  She was started on Flovent and also given symptomatic relief with Tessalon Perles and Humibid.  She is advised to continue the Flovent inhaler and Serevent inhaler indefinitely.  #6 -  QUESTION OF SLEEP APNEA GIVEN THE PATIENTS BODY HABITUS AND INITIAL COMPLAINTS OF SHORTNESS OF BREATH:  She is possibly at risk for obstructive sleep apnea.  A formal sleep study was not performed; however, a continuous pulse oximetry the night prior to discharge revealed oxygen saturations at 92-96%.  This is somewhat reassuring; however, we will defer the decision to get a formal sleep study to the primary care Pressley Tadesse.  #7 - DIABETES MELLITUS:  Admission hemoglobin A1C is 11.9 consistent with poor control.  The patient was continued on 70/30 insulin regimen with sliding scale.  Her discharge insulin regimen is _______ units in the morning and 4 in the evening.  #8 - GASTROPARESIS SECONDARY TO HER DIABETES:  The patient did  have some nausea and vomiting on presentation as well as towards the end of her hospital stay.  An upper GI was performed which did not reveal any evidence of gastric outlet obstruction; however, did confirm moderate to severe delaying consistent with gastroparesis.  She was continued on Reglan.  #9 - HYPERTENSION, POORLY CONTROLLED:  She was continued on Norvasc at 10, clonidine, Lotensin.  This will need to be monitored in the outpatient setting as well.  CONSULTATIONS:  Dr. Lowell Guitar of nephrology.  PROCEDURES: 1. Renal ultrasound:  Results noted above, normal. 2. Upper GI:  No evidence of gastric outlet obstruction, moderate to severe    delay consistent with gastroparesis. 3. SPEP:  Nonspecific, consistent with inflammatory changes, C3 of 132, C4 of    37, 24-hour urine revealing 22.6 g  protein. 4. Chest x-ray consistent with pulmonary edema.  CONDITION ON DISCHARGE:  The patient has stable vital signs, is ambulating without difficulty, markedly improved dyspnea on exertion with impressive resolution of anasarca.  Her discharge weight is 250 pounds. DD:  12/30/99 TD:  12/30/99 Job: [id number]

## 2010-07-11 NOTE — Discharge Summary (Signed)
[name], [name]                 ACCOUNT NO.:  [id number]   MEDICAL RECORD NO.:  [id number]          PATIENT TYPE:  INP   LOCATION:  5509                         FACILITY:  [facility]   PHYSICIAN:  Cecille Aver, M.D.DATE OF BIRTH:  [birth year]   DATE OF ADMISSION:  02/13/2006  DATE OF DISCHARGE:  02/18/2006                               DISCHARGE SUMMARY   DISCHARGE DIAGNOSES:  1. Gastroparesis, resolved.  2. Diabetes mellitus.  3. Hypertension.  4. Anemia.  5. Secondary hyperparathyroidism.  6. End-stage renal disease.   HISTORY OF PRESENT ILLNESS:  This is a 60 year old African American  female with an extensive past medical history that includes end-stage  renal disease (Tuesday, Thursday and Saturdays at the [facility]), diabetic diabetes mellitus was noted diabetic  gastroparesis, hyperlipidemia, peripheral vascular disease status post  bilateral total amputations and coronary artery disease.  She presented  for the third day in a row to the emergency department with complaints  of nausea and vomiting.  She is clearly failed outpatient treatment so  she will be admitted for further management.  The patient states she has  had at least 3 days of nausea, vomiting with upper abdominal pain.  She  denies any hematemesis, melena or bright red blood per rectum.   ADMISSION LABORATORY DATA:  A pH 7.546, pCO2 29.3, bicarb 25.4, CO2 26,  acid base is 4.  WBC 8.5, hemoglobin 16.7, hematocrit 49.0 and platelets  unable to count.  Sodium 133, potassium 4.6, chloride 105, glucose 243,  BUN 43, creatinine 9.7, total bilirubin 0.8, indirect bilirubin 0.2,  direct bilirubin 0.6, indirect alkaline phos of 83, AST is 15, ALT is  13, total protein 7.5 and albumin 3.2, lipase 44.   DIAGNOSTIC RADIOLOGICAL EXAMINATIONS:  1. February 12, 2006, acute abdomen with PA of the chest:  Impression:      1) nonobstructive bowel gas pattern; 2) cardiac enlargement; 3)  under inflated lungs with bibasilar atelectasis.  2. February 14, 2006, one-view chest x-ray:  Impression:  1)      cardiomegaly with pulmonary vascular congestion.  Cannot exclude      minimal interstitial edema; 2) mild by basilar atelectasis.   HOSPITAL COURSE:  Problem 1:  GASTROPARESIS IMPROVED:  The patient was  admitted.  Films were performed with results as stated above.  She was  initially kept n.p.o. and was given IV antiemetics, proton pump  inhibitors and pain medications for symptomatic relief.  During the  hospitalization, the patient began to improve and she was gradually  started on a clear liquid diet and progressed as tolerated.  By  discharge, the patient's symptoms are well controlled and she is  tolerating a renal diabetic diet.   Problem 2:  DIABETES MELLITUS:  Accu-Cheks were performed a.c. and h.s.  with sliding scale insulin coverage.  During the hospitalization, we  maintained fair control of the patient's Glucomander and discharge the  patient's blood glucose level was 178.   Problem 3:  HYPERTENSION:  The patient's blood pressure was elevated on  admission; however, at that time she  had difficulty keeping down oral  medications secondary to #1.  The patient was also noted to have some  volume on as well which was also contributing to her hypertension.  Her  estimated dry weight was decreased with hemodialysis, and at the time of  discharge, the patient's blood pressure had greatly improved with large  ultrafiltration of hemodialysis and a decreased dose of her blood  pressure medication.   Problem 4:  ANEMIA:  The patient did not require Epogen therapy during  her hospitalization secondary to a hemoglobin at goal.  The patient did  continue iron therapy weekly with hemodialysis per her outpatient  regimen which she tolerated well and without difficulty.  The patient  remained without active signs of bleeding during her stay, and at the  time of discharge,  the patient's hemoglobin is 13.3 and hematocrit 41.2.   Problem 5:  SECONDARY HYPERPARATHYROIDISM:  Upon admission, the patient  was continued on her outpatient regimen of vitamin D therapy as her diet  was progressed.  The patient was started on her phosphate binder  therapy; however, it was a lower dose than her outpatient regimen.  At  the time of discharge, the patient's phosphorus is 8.0.  However, as  stated previously, she was not taking the phosphate binder therapy  dosage per her preadmission regimen.  This will be restarted as she is  discharged.   Problem 6:  END-STAGE RENAL DISEASE:  The patient was continued on  hemodialysis during her hospitalization via a left upper extremity AV  graft.  The average blood flow rate was 400 with average ultrafiltration  of 3 liters.  The patient seemed to tolerate hemodialysis well without  difficulty.   DISCHARGE MEDICATIONS:  1. Norvasc 5 mg 1 p.o. q.h.s.  2. Nephro-Vite 1 p.o. daily.  3. PhosLo 667 mg 3 tablets t.i.d. with meals and two with snack.  4. Protonix 40 mg n.p.o. b.i.d. x1 month and then the patient is      instructed to decrease the frequency to q.h.s.  5. Reglan 10 mg 1 p.o. q.a.c. and h.s. x1 month and then the patient      has been instructed to decrease to 5 mg q.a.c. and h.s.  6. Insulin 70/30 with 30 units subcutaneously q.a.m. and 20 units      subcutaneously q.p.m.  7. Lorazepam 1 mg one p.o. b.i.d. p.r.n. anxiety.  8. Hydroxyzine 25 mg 1 p.o. daily p.r.n. pruritus.  9. Hemodialysis medications InFeD 50 mg IV every week and Sular 5 mcg      IV every treatment.   DISCHARGE INSTRUCTIONS:  The patient is to resume a renal diabetic diet  with a 1200 mL fluid restriction.  Activity as tolerated.  She is to  return to the outpatient dialysis center per her preadmission schedule.   HEMODIALYSIS INSTRUCTIONS:  The patient's estimated dry weight has been decreased to 122 kg and she will continue a 4 hours treatment  time, on a  2 K 2.5 calcium bath with standard heparin.      Tracey P. Sherrod, NP    ______________________________  Cecille Aver, M.D.   TPS/MEDQ  D:  02/18/2006  T:  02/18/2006  Job:  [id number]   cc:   [facility]  [facility]

## 2010-07-11 NOTE — Discharge Summary (Signed)
. [facility]  Patient:    [name], [name]                        MRN: [id number] Adm. Date:  [id number] Disc. Date: [id number] Attending:  Alfonso Ramus Dictator:   Bernerd Pho, M.D. CC:         Bonnell Public, M.D., [facility] Outpatient [facility]                           Discharge Summary  DISCHARGE DIAGNOSES: 1. Nausea and vomiting, resolved; likely secondary to gastroparesis or    gastroenteritis with the possibility of post nasal drip accentuating    gag reflex. 2. Congestive heart failure, stable. 3. Chronic obstructive pulmonary disease/asthma, stable. 4. Diabetes, stable. 5. Chronic renal insufficiency, stable.  DISCHARGE MEDICATIONS:  1. Albuterol metered dose inhaler with spacer two puffs q.6h. p.r.n.     wheezing.  2. Serevent metered dose inhaler with spacer two puffs b.i.d.  3. Flovent 110 mcg metered dose inhaler two puffs b.i.d.  4. Norvasc 10 mg p.o. q.d.  5. Enteric coated aspirin 81 mg p.o. q.d.  6. Lotensin 30 mg p.o. q.d.  7. Digoxin 0.125 mg p.o. q.d.  8. Lasix 80 mg p.o. q.d.  9. Insulin NPH 20 units in the morning and 4 units at night. 10. Nephro-Vite one tablet p.o. q.d. 11. Reglan 20 mg p.o. t.i.d. before meals. 12. Pepcid 20 mg p.o. q.d. 13. Coreg 3.125 mg p.o. b.i.d. with food. 14. Phenergan 25 mg p.o. q.6h. p.r.n. nausea. 15. Robitussin CM 10 cc p.o. q.6h. p.r.n. cough or gag reflex due to     post nasal drip.  FOLLOW-UP:  The patient is to see Bernerd Pho, M.D., at the [facility] internal medicine outpatient clinic on Jul 07, 2000, at 2 p.m. in [facility] clinic. After this hospital follow-up visit, the patient will see her regular physician, Bonnell Public, M.D., in June as already scheduled.  CONSULTATIONS:  None.  PROCEDURES:  None.  CHIEF COMPLAINT:  Nausea and vomiting.  HISTORY OF PRESENT ILLNESS:  The patient is a 60 year old African-American female with history of CHF  (ejection fraction 20%), COPD, diabetes mellitus and pulmonary hypertension who presents with abdominal pain and nausea.  The patient states she was doing well until about two to three days prior to admission when she developed nausea and emesis.  On the day of admission, the patient states she vomited about 20 times.  The patient denies any hematemesis, diarrhea.  The patients last bowel movement was the day prior to admission and was normal.  The patient denies any chest pain.  She does acknowledge a slight increase in shortness of breath.  The patient denies any bright red blood per rectum, melena, fever, chills, sick contacts, or exposure to any unusual foods.  The patient states she has stopped taking her medication two to three days prior to admission. The patient was recently discharged from [facility] on June 01, 2000, for CHF exacerbation.  PAST MEDICAL HISTORY: 1. CHF with pulmonary hypertension and small pericardial effusion diagnosed    during her last admission.  The patient had adenosine Cardiolite on    May 28, 2000, which showed no ischemia and an ejection fraction of    20 to 30% with increased PA pressure.  An echocardiogram was performed    on May 27, 2000, which showed an  ejection fraction of 20 to 30%.  There    was moderately pulmonary hypertension on echo and a small free-flowing    pericardial effusion which was circumferential to the heart and a    moderately dilated left ventricle.  The patients discharge weight from    that admission was 243.6 pounds and her admission weight during that    admission was 270.4 pounds.  During that admission, her ACE inhibitor and    Lasix were increased and the plan was for carvedilol as an outpatient. 2. COPD with history of asthma.  It is unclear if the patient has ever had    any formal PFT studies but carries a label of COPD/asthma.  The patients    discharge pre- and post peak flows were 370 and 400,  respectively. 3. Diabetes mellitus type 2. 4. CAD with history of MI. 5. Chronic renal insufficiency with nephrosclerosis.  Discharge BUN 70,    creatinine 3.3 in April of 2002. 6. Gastroparesis secondary to diabetes mellitus. 7. Hyperlipidemia. 8. Hypertension.  MEDICATIONS ON ADMISSION:  1. Lasix 80 mg p.o. q.d.  2. Lotensin 30 mg p.o. q.d.  3. Flovent two puffs b.i.d.  4. Serevent two puffs b.i.d.  5. Albuterol two puffs q.6h. p.r.n.  6. Norvasc 10 mg p.o. q.d.  7. Digoxin 0.125 mg p.o. q.d.  8. Reglan 20 mg p.o. q.a.c. t.i.d.  9. Enteric coated aspirin. 10. Pepcid 20 mg p.o. q.d. 11. Insulin NPH 20 units in the morning and 4 units at night. 12. Nephro-Vite one tablet p.o. q.d. 13. Doxazosin 2 mg p.o. q.h.s.  ALLERGIES:  Intolerance to CAPTOPRIL which causes cough and CODEINE which causes a rash.  The patient states she is allergic to ASPIRIN yet she has been taking aspirin for quite some time.  FAMILY HISTORY:  Noncontributory.  SOCIAL HISTORY:  The patient is on disability secondary to medical problems for the last six years. The patient [place] She denies any alcohol consumption.  PHYSICAL EXAMINATION:  GENERAL APPEARANCE:  The patient is in no apparent distress and is sleeping.  VITAL SIGNS:  Temperature 98.0, respiratory rate 24, saturation on room air 90%, blood pressure lying was 211/102 with a rate of 94, sitting was 206/94 with a rate of 95, and standing was 184/104 with a rate of 102.  The patient was asymptomatic on orthostatic blood pressure check.  HEENT:  Pupils are equal, round and reactive to light.  Extraocular movements intact.  ENT otherwise normal oropharynx.  NECK:  No JVD, supple.  PULMONARY:  Decreased breath sounds with few crackles at the bases, no wheeze.  CARDIOVASCULAR:  Regular rate and rhythm.  ABDOMEN:  Soft, nondistended, obese, positive bowel sounds, slight tenderness in the left  upper quadrant.  EXTREMITIES:  No peripheral edema.   ADMISSION LABORATORIES:  Admission chest x-ray was read as marked cardiomegaly, question pericardial effusion.  Question mild edema.  The patients abdominal film showed gastric distention.  Otherwise there was no evidence of free intraperitoneal air.  The patients admission lipase was 26.  Her white count was 9.0, hemoglobin 13.3, with platelets of 296.  Serum ketones were negative.  Digoxin level was 0.8.  Magnesium 2.3, phosphorus 4.7.  Her sodium 145, potassium 4, chloride 110, bicarb 23, BUN 34, creatinine 3.8, glucose 236.  AST 23, ALT 19, alkaline phosphatase 97, total bilirubin 0.6, albumin 2.6.  Her EKG showed no significant change from prior EKGs done in April 2002.  HOSPITAL  COURSE:  #1 - NAUSEA AND EMESIS:  The differential diagnosis for the patients nausea and emesis included gastroparesis secondary to diabetes mellitus, gastroenteritis, hyperaccentuated gag reflex secondary to post nasal drip and upper respiratory infection.  The patient did not receive IV fluids and she has a history of CHF, but her diuretics were held initially on admission.  By Jun 29, 2000, the patient was completely normal and was at baseline. The patient tolerated p.o. intake at breakfast and lunch well with no subsequent nausea or emesis. The patients abdominal pain subsided by hospital day #2. The patients white count also trended downward and on discharge, was 8.3. The patient remained afebrile and her abdominal exam remained benign throughout admission.  The patient also received her insulin at a lower dose during this admission.  The patient is being discharged with some Phenergan in case she needs it on a p.r.n. basis. The patient is instructed to consume a mostly liquid diet for the next few days and avoid any heavy, fatty, or dairy products for the next few days.  The patient has follow-up in about a week.  #2 - CONGESTIVE HEART  FAILURE:  The patients congestive heart failure was stable during this admission.  The patients admission weight was 242 and her discharge weight from the last admission on June 01, 2000, was 243.6; therefore the patient was not volume overloaded.  The patients CHF medications were continued during this admission and Coreg was added to the patients regimen at 3.125 mg p.o. b.i.d.  The patient is instructed that she may have some weight gain over the next couple of weeks and may feel a little more short of breath.  The patient is instructed to call the clinic sooner if she develops an increase in weight greater than 4 to 5 pounds or if she feels progressively short of breath.  #3 - HYPERTENSION:  The patients medications were continued during this admission but the patients doxazosin was discontinued.  It is unclear whether the patient is on alpha blocker since alpha blockers have a tendency of worsening CHF symptoms. Therefore, the patients doxazosin was discontinued and the patient is discharged with instructions not to continue doxazosin. The patients blood pressures were elevated during this admission and should be reassessed during follow-up.  The doses of her other medications were not increased since Coreg was added to the patients regimen during this admission.  #4 - DIABETES MELLITUS:  The patient was started on half the dose of her normal insulin during this admission.  The patient is instructed to continue taking insulin even if she feels sick in the future but half her normal dose. The patient understands these "sick day rules."  The patient is also on Reglan for gastroparesis likely due to diabetes mellitus.  #5 - COPD/ASTHMA:  The patients symptoms have been stable during this admission.  It is unclear if the patient has a formal diagnosis of asthma in regard to diagnostic PFT studies.  Old records were not available for review. The patients diagnosis of COPD/asthma should  be reviewed in the future.  The patients discharge pre- and post peak flow values were 400.  The patient did not show much improvement in regard to her peak flows after her albuterol treatment, therefore, the diagnosis of asthma is somewhat suspect.  #6 - CHRONIC RENAL INSUFFICIENCY:  The patients BUN and creatinine were near her baseline.  The patients discharge BUN was 28 and her creatinine was 3.8. The patient has declined hemodialysis in  the past. The patients chronic renal insufficiency is likely secondary to diabetes.  Even though the patient has chronic renal insufficiency, she has done well on an ACE inhibitor.  During the last admission, the patients ACE inhibitor was actually increased from 20 to 30 mg p.o. q.d.  The patient has not had any hyperkalemia due to the ACE inhibitor as her potassiums have usually been between 3.5 and 4.  Therefore, even though the patient has chronic renal insufficiency, she has done rather well on an ACE inhibitor needed for her congestive heart failure.  #7 - PERICARDIAL EFFUSION:  The patients old films from her April admission were not available for comparison.  The patient remained stable during this admission in regard to hemodynamic values and exam.  DISPOSITION:  The patient is being discharged to home.  DISCHARGE LABORATORIES:  Sodium 142, potassium 3.7, chloride 107, bicarb 29, BUN 28, creatinine 3.8, glucose 89, calcium 8.5.  White count 8.3, hemoglobin 12.3, platelets 257, ANC 4.6.  The patient was sating 92% on room air and no weights unfortunately were obtained on Jun 28, 2000, and Jun 29, 2000, so there is no discharge weight but the patients weight on admission was 242 pounds.  DIET:  The patient is instructed to continue with her fluid restriction of one liter a day and low sodium diet.  The low sodium diet is of greatest importance but the patient has been on a fluid restriction in the past and will continue with the fluid  restriction.  RESIDENT PHYSICIAN:  Karlene Einstein, M.D., and Bernerd Pho, M.D.  ATTENDING:  Dineen Kid. Reche Dixon, M.D. DD:  06/29/00 TD:  06/30/00 Job: [id number]

## 2010-07-11 NOTE — H&P (Signed)
NAME:  [name], [name]                           ACCOUNT NO.:  [id number]   MEDICAL RECORD NO.:  [id number]                   PATIENT TYPE:  INP   LOCATION:  5508                                 FACILITY:  [facility]   PHYSICIAN:  Aram Beecham B. Eliott Nine, M.D.             DATE OF BIRTH:  [birth year]   DATE OF ADMISSION:  06/17/2003  DATE OF DISCHARGE:                                HISTORY & PHYSICAL   REASON FOR ADMISSION:  Nausea and vomiting x1 day.   HISTORY OF PRESENT ILLNESS:  [name] is a 60 year old African-American  female with known diabetes, end-stage renal disease, coronary artery  disease, here for 1 day of nausea and vomiting.  Says it started around 8:30  the day prior to admission.  Abrupt onset of nausea and vomiting multiple  times throughout the night.  Also, the patient had some abdominal pain that  she described as fleeting, suprapubic in location.  No chest pain, no  fevers, no new foods, no sick contacts, no new medications, no hematemesis  or hematochezia, no dysuria or hematuria.  Her last bowel movement was 2  days ago which was normal.  She still has periods but denies any recent  sexual activity.  The patient does note that during dialysis the day that  symptoms started her systolic blood pressure did decrease down to the 80s  and 90s.   PAST MEDICAL HISTORY:  Past medical history significant for:  1. Diabetes with gastroparesis.  2. End-stage renal disease secondary to #1 with hemodialysis on Tuesdays,     Thursdays, Saturdays at [facility].  3. Peripheral vascular atherosclerotic disease.  4. Coronary artery disease status post myocardial infarction in 1989.  5. Cerebrovascular accident in 1989 with no deficits.  6. Status post right fem-pop bypass.  7. Congestive heart failure with an EF of 25%.  8. Hypertension.  9. Depression/anxiety.  10.      Gastroesophageal reflux disease.  11.      Anemia.  12.      Left ankle fracture in 2003.  13.       Left shoulder bursitis.  14.      Secondary hyperparathyroidism secondary to #2.   MEDICATIONS:  1. PhosLo three pills q.a.c.  2. Seroquel 100 mg q.h.s.  3. Zocor 20 mg q.h.s.  4. Reglan 20 mg q.a.c.  5. Bupropion 100 mg p.o. b.i.d.  6. Insulin 70/30 30 units q.a.m. and 20 units q.p.m.   ALLERGIES:  CODEINE and ASPIRIN which causes a rash.   FAMILY HISTORY:  Consists of positive diabetes but no cancer and no  abdominal problems.   SOCIAL HISTORY:  No tobacco, no alcohol, no drugs.  Lives in [place] with  her mother.   PHYSICAL EXAMINATION:  VITAL SIGNS:  Temperature is 99.0.  Blood pressure  lying down is 210/110 with heart rate of 103; sitting is 100/70 with a heart  rate of 100.  Respiratory rate is 20, saturating 78% on room air, 94% on 4  L.  GENERAL:  She is in mild distress, uncomfortable appearing.  CARDIOVASCULAR:  Regular rate and rhythm.  No murmurs, rubs, or gallops.  No  JVD.  RESPIRATORY:  Clear to auscultation, decreased air movement, mild scattered  wheezes.  ABDOMEN:  No bowel sounds heard, soft, nontender, nondistended.  EXTREMITIES:  No clubbing, cyanosis, or edema.  Bilateral feet are bandaged.  HEENT:  Normocephalic, atraumatic.  No lymphadenopathy.  Hirsute appearing.   LABORATORY DATA:  Sodium 136, potassium 5.5, chloride of 99, CO2 of 37,  creatinine of 7.3, glucose of 107.  A ph was 7.4, CO2 54.7, bicarb of 33.  Hemoglobin of 18.7, platelet count of 296, white blood cell count 11.3, and  ANC of 9.  Chest x-ray shows stable cardiomegaly.  Abdominal x-ray showed  positive fecal impaction, no free air.   ASSESSMENT AND PLAN:  [name] is here with nausea and vomiting after  hemodialysis and fecal impaction.   1. Nausea and vomiting.  Multiple risks for nausea and vomiting include     intermittent bowel ischemia secondary to hypotension during hemodialysis.     Also with fecal impaction and known gastroparesis.  For her plan, will     make her  n.p.o. except for medications right now, will treat the fecal     impaction with disimpaction and Therevac until bowel movement.  Will     resume her treatment for gastroparesis with either p.o. or IV Reglan.     Will also check an amylase and lipase to rule out pancreatitis.  Will     check a urine hCG.  Will also check cardiac enzymes x1.  If normal will     not continue serial cardiac enzymes.  Will slowly advance the diet as     tolerated and treat her symptoms with Phenergan and Zofran.  2. Hypoxia.  The patient has room air saturations that rechecked were 88 to     90 on room air. Etiology of this is unknown.  Chest x-ray is normal.     This is somewhat concerning of PE.  If the patient continues to be     hypoxic will consider a VQ scan versus CT but for now will follow,     especially in the absence of any chest pain or acute onset of shortness     of breath.  3. Increased leukocytosis, unsure whether this is a result of fecal     impaction or bilateral foot ulcers.  Will continue to monitor and also at     this time will obtain blood cultures and urinalysis with urine culture as     well.      [name], MD                         [facility] Salvia Eliott Nine, M.D.    JH/MEDQ  D:  06/18/2003  T:  06/18/2003  Job:  [id number]

## 2010-07-11 NOTE — Consult Note (Signed)
[place]  Patient:    [name], [name] Visit Number: [id number] MRN: [id number]          Service Type: MED Location: [phone] Attending Physician:  Farley Ly Dictated by:   Delano Metz, M.D. Admit Date:  01/04/2001                            Consultation Report  REASON FOR CONSULTATION:  Elevated creatinine and vomiting.  HISTORY OF PRESENT ILLNESS:  The patient is a pleasant, 60 year old black female with a chronic renal failure due to diabetes admitted with uncontrolled hypertension and refractory nausea and vomiting. She was admitted two days ago on November 2. The blood pressure has been relatively easy to control. It was 240/103 on admission. Nausea and vomiting, however, have been refractory and persistent. Now responding to p.o. Reglan. The patient says that this has been an increasing problem over the past several weeks. The patients creatinine was 6.7 on admission up to 7.3 today. It was 3.8 in May of this year; and on a recent admission three weeks ago, October 25, the creatinine was 7. An ultrasound done on that admission three weeks ago showed no hydronephrosis and bilateral 10 cm kidneys. Estimated creatinine clearance currently with her current creatinine is 10 cc/min or slightly less. She has diabetic nephropathy.  PAST MEDICAL HISTORY: 1. Diabetes mellitus type 2. 2. Chronic renal failure as above. 3. Hypertension. 4. Cardiomyopathy with ejection fraction 25% in the past. However, recent    echocardiogram in October showed EF of 45%. 5. COPD and pulmonary hypertension. 6. Anemia. 7. GERD. 8. CVA in 1998. 9. CAD with history of MI.  CURRENT MEDICATIONS: 1. Coreg 6.25 b.i.d. 2. Protonix. 3. Catapres 0.2 t.i.d. 4. Lasix 80 mg q.d. 5. PhosLo one ______ with meals. 6. Lanoxin 0.125 q.d. 7. Atrovent and albuterol. 8. P.o. Reglan.  SOCIAL HISTORY:  The patient is divorced and lives with her parents  in [place]. No tobacco, alcohol, or drug use.  REVIEW OF SYSTEMS:  GENERAL:  No fever, chills, or sweats. ENT:  No hearing loss, visual changes, sore throat, or difficulty swallowing. RESPIRATORY:  No productive cough, pleuritic chest pain, or shortness of breath. CARDIAC:  No chest pain, orthopnea, PND. GI:  Diffuse upper abdominal discomfort which is vague and not severe. No diarrhea or bloody stools. GU:  No change in voiding habits or dysuria. MUSCULOSKELETAL:  No acute joint complaints. NEUROLOGICAL: History of stroke as above. No seizures.  PHYSICAL EXAMINATION:  VITAL SIGNS:  Blood pressure is 180/90, pulse 80s and regular, respirations 16 and unlabored, afebrile.  GENERAL:  A markedly obese, black female.  SKIN:  No rash.  HEENT:  PERRLA. EOMI. Throat was clear.  NECK:  Supple and obese. Unable to detect neck veins.  CHEST:  Mostly clear. Occasional rales at the bases.  CARDIAC:  Regular rate and rhythm without murmur, rub, or gallop.  ABDOMEN:  Obese. No masses or ascites.  EXTREMITIES:  Revealed 2+ pitting edema.  NEUROLOGICAL:  Grossly nonfocal exam. No asterixis.  LABORATORY DATA:  Sodium 140, potassium 3.9, BUN 70, creatinine 7.3. White blood count 10,000, hematocrit 25%, platelets normal.  Chest x-ray:  Large cardiac silhouette, ? underlying effusion, mild edema.  PROBLEMS: 1. Chronic renal failure with refractory nausea and vomiting most likely due    to uremia. 2. Volume excess with uncontrolled blood pressure and peripheral edema. 3. Diabetes mellitus. 4. Uncontrolled  hypertension. 5. Anemia. 6. History of coronary artery disease. 7. Rule out secondary hyperparathyroidism.  PLAN: 1. Proceed with initiation of hemodialysis today. Discussed at length with    patient who is agreeable. 2. Check echocardiogram for effusion. 3. IV Reglan until uremic symptoms have been treated with dialysis. 4. Check PTH level. Start EPO. Check iron stores.  Thank  you for the referral. Dictated by:   Delano Metz, M.D. Attending Physician:  Farley Ly DD:  01/06/01 TD:  01/06/01 Job: [id number]

## 2010-07-11 NOTE — H&P (Signed)
[place]  Patient:    [name], [name]                        MRN: [id number] Adm. Date:  [id number] Attending:  Anastasio Auerbach                         History and Physical  CHIEF COMPLAINT:  Short of breath, congestive heart failure and anasarca.  HISTORY OF PRESENT ILLNESS:  This is an unfortunate 60 year old female with a history of the following:  #1 - Morbid obesity.  #2 - Dilated cardiomyopathy with an ejection fraction of 20%; this was noted on 2-D echo in 1997.  She had left ventricular dilatation and aortic sclerosis at the time. #3 - Congestive heart failure.  #4 - Hypertension.  #5 - Type 2 diabetes mellitus.  #6 - Diabetic gastroparesis -- had an abnormal gastric emptying study in 1995.  #7 - Chronic medical noncompliance.  #8 - History of MI in 49 in [place], [place].  #9 - History of right CVA with left-sided deficits in 1998 -- still has left lower extremity weakness when compared to the right. #10 - Questionable history of asthma.  Patient has been followed at [facility] over the last several years and had transferred to outpatient clinics to [facility] but never kept appointments.  Last seen at [facility] approximately three months ago.  Stopped all medications approximately three weeks prior to admission because of nausea and vomiting. She presents now with severe shortness of breath but no chest pain.  She is in pulmonary edema and has anasarca with mildly positive cardiac enzymes.  After IV Lasix, IV morphine and IV nitroglycerin, her breathing is much improved. The patient had been on nasal BiPAP but is now off the nasal BiPAP and oxygenating well.  It should be mentioned that her creatinine was 1.2 in June of 1999.  USUAL MEDICATIONS 1. Lotensin -- ? dose. 2. Reglan 10 mg with meals. 3. Insulin 70/30 32 units in the morning and 24 units in the p.m. 4. Lanoxin 0.125 mg a day. 5. Norvasc 5 mg a day. 6. Cardura, ? dose at  night. 7. Pepcid daily but dose unknown. 8. Lasix 60 mg a day.  ALLERGIES:  Apparently ENTERIC-COATED ASPIRIN causes hives.  Regular aspirin is okay, apparently.  CODEINE causes a rash and CAPOTEN causes a cough.  PAST SURGICAL HISTORY:  Negative.  FAMILY HISTORY:  Positive for diabetes and hypertension.  REVIEW OF SYSTEMS:  Questionable history of chronic pancreatitis; she has nausea and vomiting intermittently.  SOCIAL HISTORY:  She is divorced.  Lives with parents.  No children.  Disabled for six years; this is secondary to her cardiac condition.  No tobacco use, alcohol use or drug use.  PHYSICAL EXAMINATION  GENERAL:  Morbidly obese female sitting at 90 degrees with nasal BiPAP mask removed to nasal cannula O2.  She is alert and oriented.  VITAL SIGNS:  Blood pressure 162/94, pulse 100 and regular, respiratory rate 16.  O2 saturation is 100% on 6 L.  She is afebrile -- temperature 98.2.  HEENT:  Atraumatic, normocephalic.  Oropharynx clear.  NECK:  Obese and supple.  CHEST:  Diffuse quiet rales, present more in bases.  Scattered expiratory wheezes are present but faint.  CARDIAC:  Distant cardiac sounds.  No rubs are heard.  ABDOMEN:  Diffuse abdominal wall edema.  Her abdomen is tender to palpation with bowel  sounds positive.  No rebound.  BREASTS:  Breasts are also edematous as well.  EXTREMITIES:  The extremities have 4+ edema throughout.  NEUROLOGIC:  Oriented x 3.  Question of faint weakness in the left lower extremity; otherwise, no focal abnormalities.  Patient reports dysarthria with stroke in 1998 and increased left lower extremity weakness.  Cranial nerves are intact currently.  SKIN:  Edema from her breasts to her toes.  LABORATORY AND X-RAY FINDINGS:  Chest x-ray:  Cardiomegaly with CHF and bilateral effusions.  Question of a pericardial effusion.  EKG:  Sinus rhythm at 86 beats per minute, nonspecific ST-T wave changes present, poor R wave  progression with low voltage consistent with EKG in August 04, 1997.  She does have low voltage throughout.  White blood cell count 7400, hemoglobin 14.4, platelets 216,000.  Sodium 138, potassium 3.7, chloride 110, bicarb 22, BUN 35, creatinine 2.6, blood sugar 230, calcium 8.1, albumin low at 1.7, total protein low at 5.2 and LFTs are normal.  Urinalysis:  Specific gravity 1.016, pH 6, glucose 500, moderate hemoglobin, leukocyte esterase negative, nitrite negative, protein greater than 300.  CPK is 304.  CK-MB is 8.2.  Relative index 2.7; normal is up to 2.5.  Troponin I is 0.93, normal up to 0.04.  ASSESSMENT:  [age]-year-old female with a history of gastroparesis secondary to diabetes mellitus, nausea and vomiting for several weeks and no medication used for at least three to four weeks.  Last seen at [facility] approximately three months ago, by history.  Denies chest pain.  Last ischemic cardiac workup in September 1996 revealed a Cardiolite testing without reversible defect, now with anasarca, renal insufficiency, proteinuria, congestive heart failure and borderline positive cardiac enzymes.  She has diuresed 1500 cc with Lasix 80 mg x 2 intravenously and intravenous morphine sulfate and intravenous nitroglycerin.  PLAN 1. Congestive heart failure:  Continue IV diuresis with 80 mg of Lasix every    12 hours.  Follow electrolytes.  Check 2-D echocardiogram to rule out    pericardial effusion.  She has not had hypotension.  Continue IV    nitroglycerin.  Use p.r.n. morphine sulfate intravenously as needed and    resume Lanoxin. 2. For positive cardiac enzymes, check cardiac enzymes x 2 more over 24 hours.    Doubt significant myocardial injury.  Continue IV heparin and IV    nitroglycerin. 3. Anasarca/proteinuria/hypoalbuminemia:  Likely has developed a nephrotic    syndrome.  Check 24-hour urine, total protein, SPEP and urine    immunoelectrophoresis.  Place at bedrest,  elevate legs and continue    diuresis. 4. Hypertension:  Resume Lotensin 10 mg a day.  Follow BMET.  May need beta     blocker -- re-add carefully.  Consider adding Norvasc. 5. Diabetes mellitus:  Give Humulin 70/30 half dose, 16 units in the morning    and 12 units in the p.m. and use sliding-scale Regular insulin; an    1800-calorie low-salt diet. 6. Nausea and vomiting:  Resume Reglan 10 mg IV or p.o. with meals and at h.s.    and check ultrasound to rule out gallstones. DD:  12/25/99 TD:  12/26/99 Job: [id number]

## 2010-07-11 NOTE — Discharge Summary (Signed)
[name], [name] NO.:  [id number]   MEDICAL RECORD NO.:  [id number]          PATIENT TYPE:  INP   LOCATION:  5508                         FACILITY:  [facility]   PHYSICIAN:  Azucena Fallen, PA     DATE OF BIRTH:  1950-07-18   DATE OF ADMISSION:  10/03/2003  DATE OF DISCHARGE:  10/08/2003                                 DISCHARGE SUMMARY   DISCHARGE DIAGNOSES:  1.  Gastroparesis, resolved.  2.  End-stage renal disease with episode of increased venous pressure.  3.  Hypertension.  4.  Insulin-dependent-diabetes mellitus.  5.  Peripheral vascular disease.   PROCEDURES:  1.  Hemodialysis.  2.  October 08, 2003, dialysis shuntogram, performed by Dr. Gevena Mart.      Impression, negative for recurrent venous anastomotic stenosis.  No      central venous stenosis or occlusion.   HISTORY OF PRESENT ILLNESS:  Ms. Trueba is a 60 year old African American  female with a past medical history of end-stage renal disease currently  dialyzed on Tuesday, Thursday and Saturday at [facility], insulin-dependent-diabetes mellitus, hypertension, and depression  with anxiety.  She presents to the emergency room today with a one day  history of severe nausea and vomiting that is yellow in color and mid  epigastric pain.  The patient denies having any fever.  The patient states  that her last bowel movement was two days ago and was hard but regular.  The  patient was admitted last, in May 2005, for gangrenous great toes; however,  also she had a history of gastroparesis secondary to diabetes mellitus with  nausea and vomiting that did subside.  The patient also denies any weight  changes.  She states that she has been using her Reglan as instructed for  her gastric reflux.  She has not tried anything for the nausea and vomiting.  She states that nothing makes the abdominal pain better or worse.   ADMITTING LABS:  CMP:  Sodium 135, potassium 4.8, chloride 99,  BUN 44,  glucose 185, creatinine 9.6, calcium 10.8, total protein 8.5, albumin 3.8,  AST 15, ALT 16, alk phos 58, bilirubin 1.2.  Lipase 40, amylase 306,  phosphorous 4.6.   ADMISSION X-RAYS:  Chest x-ray shows cardiomegaly but no acute disease.  Acute abdominal series, two-views of the abdomen show right colonic stool  without evidence of obstruction.   HOSPITAL COURSE:  1.  Gastroparesis, resolving.  The patient was admitted with a clear liquid      diet.  She was given Phenergan for nausea and vomiting as well as Reglan      a.c. and h.s. IV.  She was also started on Sorbitol to slowly relieve      her complaints of constipation which could have been contributing to her      nausea and vomiting.  Abdominal x-rays were taken and as above were      normal.  The patient did not continue to have relief of constipation and      was given Dulcolax as well as MiraLax.  She did continue to remain      afebrile.  The patient slowly had resolution of abdominal pain through      this three day hospital stay.  She was started on GoLYTELY secondary to      sorbitol not helping to relieve her constipation.  At the time of      discharge, the patient did have resolution of her abdominal complaints.  2.  End-stage renal disease with an episode of increasing pressure.  The      patient received hemodialysis, although she had increased venous      pressure that was noted in the dialysis unit.  The patient was scheduled      for a shuntogram, dialysis shuntogram with results as stated above.  It      was not evident that that was the cause for her venous pressure and we      will continue to monitor this in the outpatient clinic.  3.  Hypertension.  The patient did have a episode of hypotension that was      noted on October 06, 2003.  This was rechecked and the patient was given      a normal saline bolus which did bring her pressure up to a stable      condition.  Her medications were held during this  event.  4.  Insulin-dependent-diabetes mellitus.  The patient continued on her      current diabetes regimen.  Her CBGs did range between 150 to 170.  There      was no further intervention.  5.  Peripheral vascular disease.  The patient's bilateral great toe      amputations, by Dr. [name], remained stable during this hospital stay.   DISCHARGE MEDICATIONS:  1.  MiraLax 17 grams with 8 ounces of water daily.  2.  Nephro-Vite one a day.  3.  Zocor 20 mg one p.o. every day.  4.  Wellbutrin 100 mg two times a day.  5.  Seroquel 100 mg one p.o. q.h.s.  6.  Reglan 10 mg one before meals and at bedtime.  7.  Insulin 70/30, 30 units in the a.m., 20 units at h.s.  8.  Renagel 800 mg t.i.d. with meals.   HEMODIALYSIS ORDERS:  The patient will have hemodialysis at [facility] on her regular Tuesday, Thursday and Saturday schedule.  We  will continue with standard __________  .  Her new estimated dry weight will  be 114 kilograms.  We will continue with her regular outpatient orders.   DISCHARGE INSTRUCTIONS:  1.  Activity as tolerated.  2.  Her diet will be a renal diet with 120 grams of protein, 2 grams of      salt, 2 grams of potassium, with 1200 cc fluid restriction.  3.  She will follow up in the dialysis unit on October 09, 2003.       MY/MEDQ  D:  11/16/2003  T:  11/18/2003  Job:  [id number]   cc:   Mindi Slicker. Lowell Guitar, M.D.  [address]  [place]  [place] [address]  Fax: [phone]   [facility]

## 2010-08-01 IMAGING — CT CT ANGIO CHEST
1 of 8 series · 17 of 36 positions shown · IV contrast (APPLIED)
Comparison: 10/14/2006

CLINICAL DATA: Short of breath

CT ANGIOGRAPHY CHEST
TECHNIQUE: Multidetector CT imaging of the chest was performed
using the standard protocol during bolus administration of
intravenous contrast. Multiplanar CT image reconstructions
including MIPs were obtained to evaluate the vascular anatomy.
Contrast: 80 ml [id number]

[Series 6: pulm embolism 0.75 b25f thins · axial · 0.69mm/px · z∈[-238,-27]mm · 17 of 794 slices shown]
[im 45/794  lung]
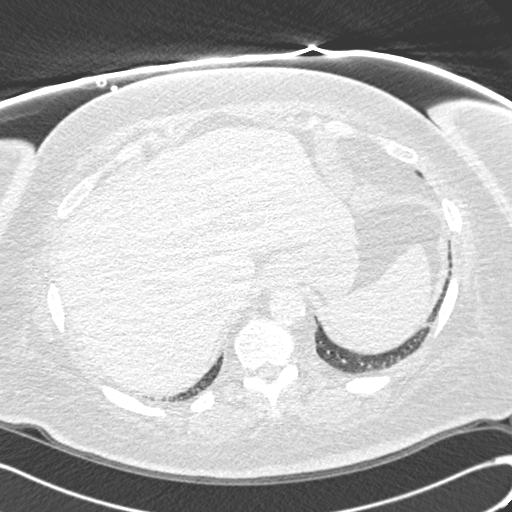
[im 89/794  mediastinal]
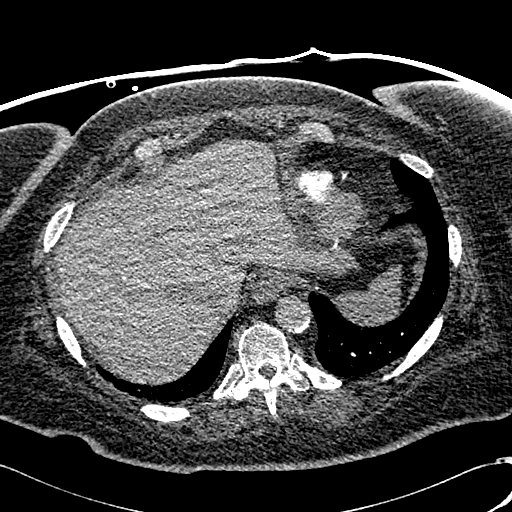
[im 133/794  lung]
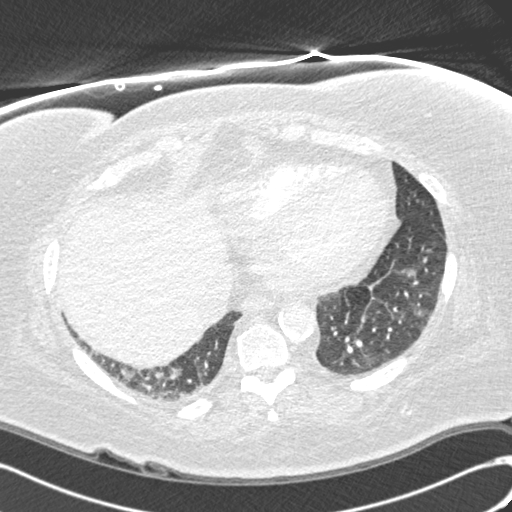
[im 177/794  mediastinal]
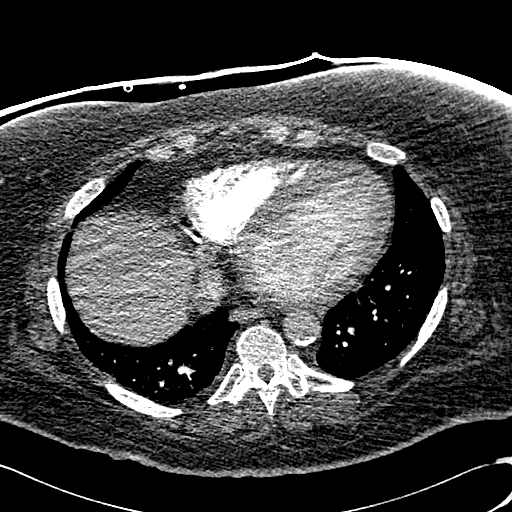
[im 221/794  lung]
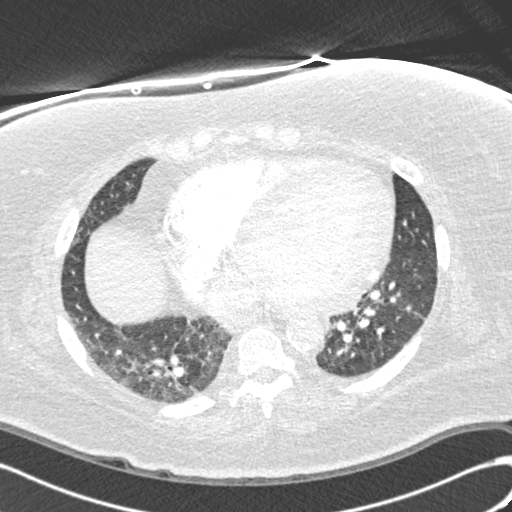
[im 265/794  mediastinal]
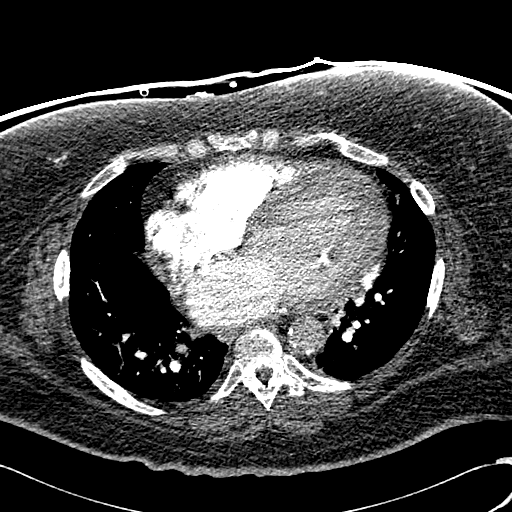
[im 309/794  lung]
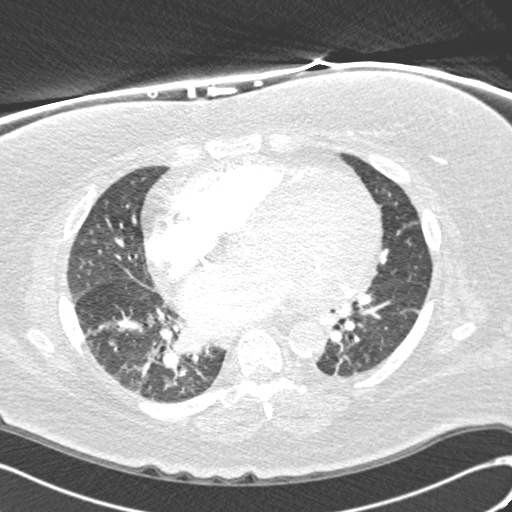
[im 353/794  mediastinal]
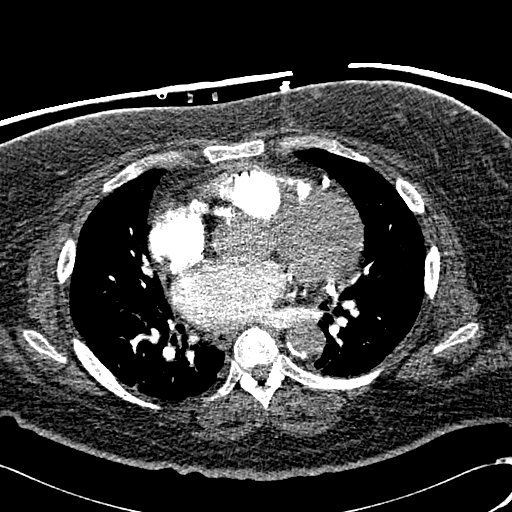
[im 397/794  lung]
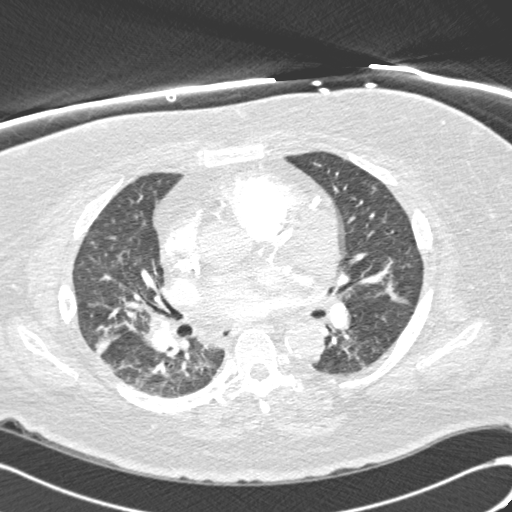
[im 441/794  mediastinal]
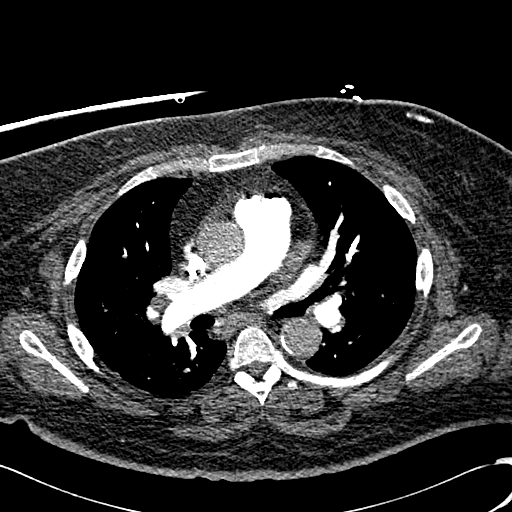
[im 485/794  lung]
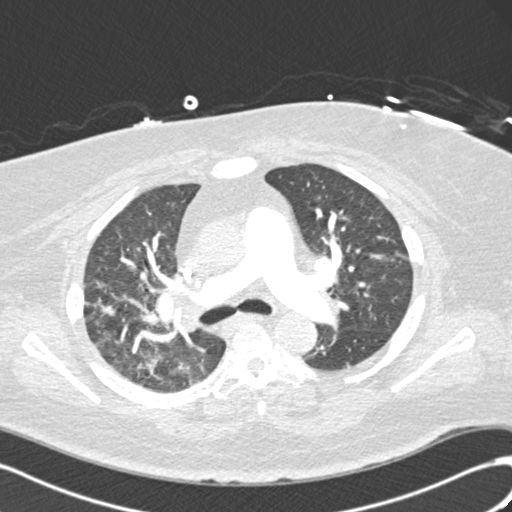
[im 529/794  mediastinal]
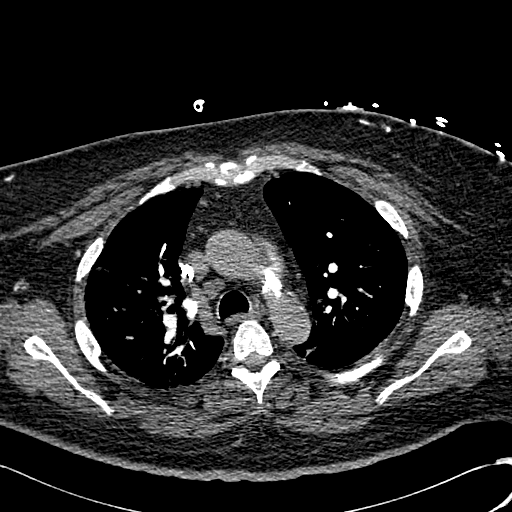
[im 573/794  lung]
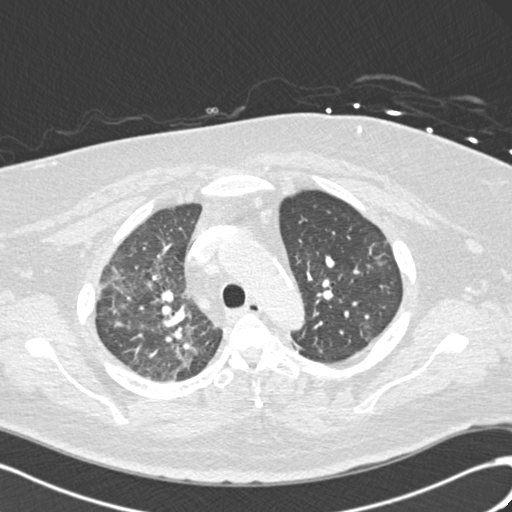
[im 617/794  mediastinal]
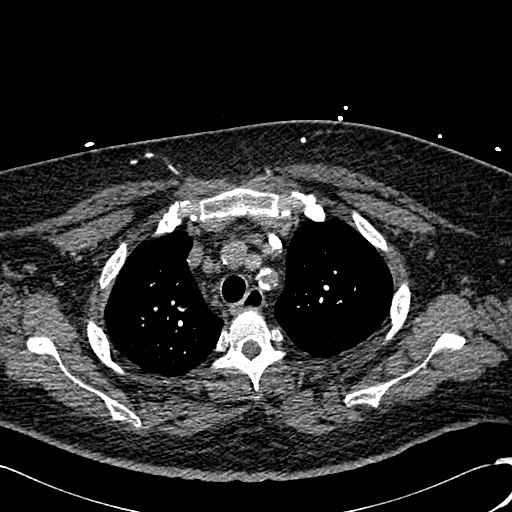
[im 661/794  lung]
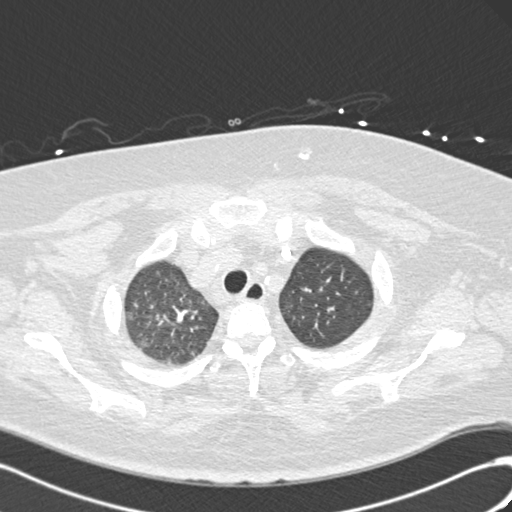
[im 705/794  mediastinal]
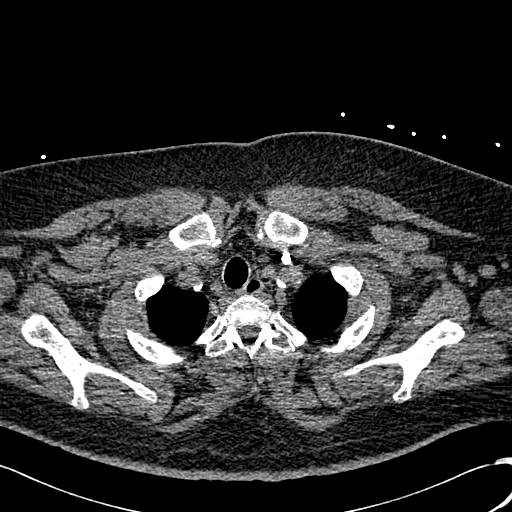
[im 749/794  lung]
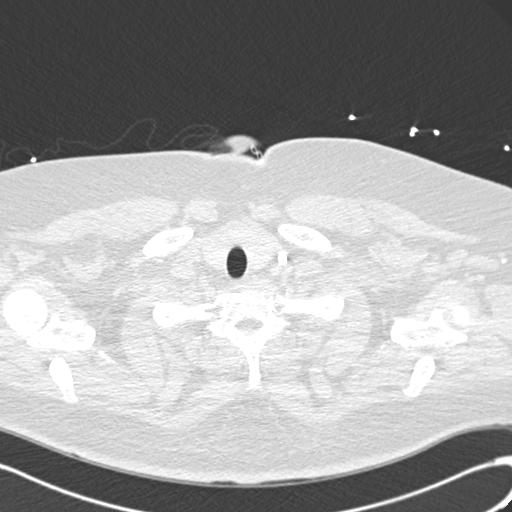

[17 of 36 positions shown; findings below may reference images not displayed]

FINDINGS: There are no filling defects in the pulmonary arterial
tree to suggest acute pulmonary thromboembolism.

Bilateral hilar adenopathy right greater than left is stable.
Small mediastinal nodes are stable.  Coronary artery calcifications
are noted.  Negative pericardial effusion.  No aortic aneurysm.  No
obvious aortic dissection.

The heart is moderately enlarged.  Patchy airspace opacities are
seen bilaterally right greater than left with an upper lung
distribution.  There is some disease in the right lower lobe.

No pneumothorax or effusion.

 Review of the MIP images confirms the above findings.
IMPRESSION: No acute pulmonary thromboembolism.  Patchy bilateral airspace
opacities.

## 2010-08-01 IMAGING — CR DG CHEST 1V PORT
1 series · 1 of 1 positions shown · non-contrast
Comparison: 05/04/2008

CLINICAL DATA: Shortness of breath.  Status post attempted central
line placement.

PORTABLE CHEST - 1 VIEW

[view not recorded]
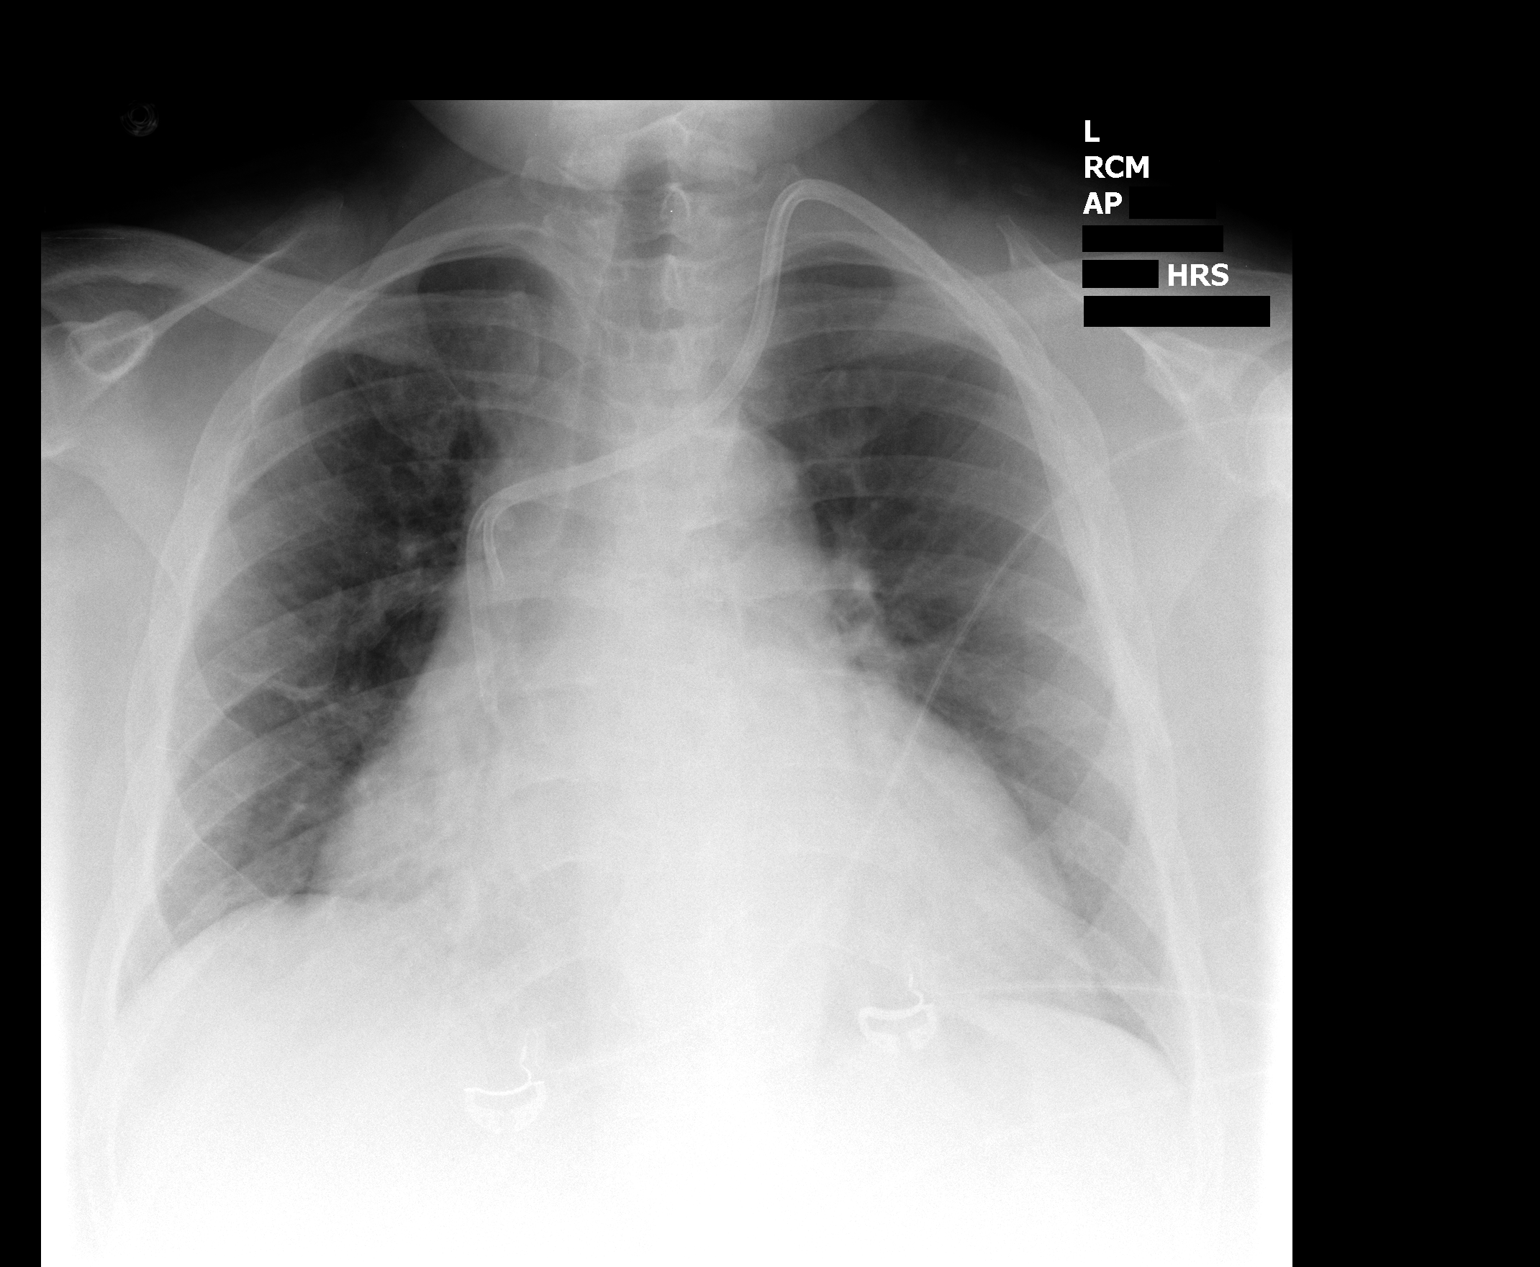

[1 of 1 positions shown; findings below may reference images not displayed]

FINDINGS: Left jugular dual lumen central venous catheter remains
in stable position.  There is no evidence of pneumothorax.  Both
lungs remain clear.  Cardiomegaly stable.
IMPRESSION: 1.  No evidence of pneumothorax or other acute findings.
2.  Stable cardiomegaly.

## 2010-11-12 LAB — POCT I-STAT 4, (NA,K, GLUC, HGB,HCT)
Glucose, Bld: 102 — ABNORMAL HIGH
HCT: 40
Hemoglobin: 13.6
Potassium: 4.3

## 2010-11-14 LAB — COMPREHENSIVE METABOLIC PANEL
Alkaline Phosphatase: 110
BUN: 21
Calcium: 9.5
Glucose, Bld: 222 — ABNORMAL HIGH
Total Protein: 8.5 — ABNORMAL HIGH

## 2010-11-14 LAB — HEMOGLOBIN A1C
Hgb A1c MFr Bld: 5.7
Mean Plasma Glucose: 126

## 2010-11-14 LAB — URINALYSIS, ROUTINE W REFLEX MICROSCOPIC
Bilirubin Urine: NEGATIVE
Nitrite: NEGATIVE
Protein, ur: 100 — AB
Specific Gravity, Urine: 1.014
Urobilinogen, UA: 0.2

## 2010-11-14 LAB — DIFFERENTIAL
Basophils Relative: 0
Lymphs Abs: 1.5
Monocytes Relative: 9
Neutro Abs: 7.2
Neutrophils Relative %: 75

## 2010-11-14 LAB — CBC
HCT: 46.4 — ABNORMAL HIGH
Hemoglobin: 14.7
MCHC: 30.9
MCHC: 31.7
MCV: 92.3
Platelets: 198
RDW: 21.1 — ABNORMAL HIGH
RDW: 21.5 — ABNORMAL HIGH

## 2010-11-14 LAB — BASIC METABOLIC PANEL
BUN: 30 — ABNORMAL HIGH
CO2: 21
Calcium: 9.7
Calcium: 9.2
Creatinine, Ser: 8.45 — ABNORMAL HIGH
Creatinine, Ser: 6.89 — ABNORMAL HIGH
GFR calc Af Amer: 6 — ABNORMAL LOW
GFR calc non Af Amer: 5 — ABNORMAL LOW
GFR calc non Af Amer: 6 — ABNORMAL LOW
Glucose, Bld: 100 — ABNORMAL HIGH
Potassium: 4.1
Sodium: 141

## 2010-11-14 LAB — CULTURE, BLOOD (ROUTINE X 2)

## 2010-11-14 LAB — LIPASE, BLOOD
Lipase: 111 — ABNORMAL HIGH
Lipase: 49

## 2010-11-14 LAB — SEDIMENTATION RATE: Sed Rate: 12

## 2010-11-14 LAB — URINE CULTURE
Colony Count: NO GROWTH
Culture: NO GROWTH

## 2010-11-14 LAB — URINE MICROSCOPIC-ADD ON

## 2010-11-14 LAB — AMYLASE: Amylase: 134 — ABNORMAL HIGH

## 2010-11-17 LAB — DIFFERENTIAL
Basophils Relative: 0
Eosinophils Absolute: 0.1
Eosinophils Absolute: 0.2
Eosinophils Relative: 2
Eosinophils Relative: 2
Lymphocytes Relative: 20
Lymphs Abs: 1.5
Lymphs Abs: 1.6
Monocytes Relative: 8
Neutrophils Relative %: 73

## 2010-11-17 LAB — POCT I-STAT CREATININE
Creatinine, Ser: 9.5 — ABNORMAL HIGH
Operator id: 234501

## 2010-11-17 LAB — CBC
HCT: 42.3
HCT: 44.2
HCT: 45.1
HCT: 42.6
Hemoglobin: 13.6
Hemoglobin: 13.5
MCHC: 31.5
MCHC: 31.7
MCHC: 31.8
MCHC: 31.7
MCV: 91.8
MCV: 91.9
MCV: 92.1
MCV: 92.7
MCV: 91.7
Platelets: 186
Platelets: 208
Platelets: 239
Platelets: 188
RBC: 4.7
RBC: 4.87
RBC: 4.64
RDW: 19.5 — ABNORMAL HIGH
RDW: 19.9 — ABNORMAL HIGH
RDW: 20 — ABNORMAL HIGH
RDW: 20 — ABNORMAL HIGH
WBC: 7.7
WBC: 8.8
WBC: 6.1

## 2010-11-17 LAB — I-STAT 8, (EC8 V) (CONVERTED LAB)
BUN: 91 — ABNORMAL HIGH
Bicarbonate: 20.6
Chloride: 110
Glucose, Bld: 112 — ABNORMAL HIGH
HCT: 51 — ABNORMAL HIGH
Hemoglobin: 17.3 — ABNORMAL HIGH
Operator id: 234501
Sodium: 136

## 2010-11-17 LAB — BASIC METABOLIC PANEL
BUN: 82 — ABNORMAL HIGH
Calcium: 9
Creatinine, Ser: 9.09 — ABNORMAL HIGH
GFR calc non Af Amer: 5 — ABNORMAL LOW
Glucose, Bld: 102 — ABNORMAL HIGH
Potassium: 7.1

## 2010-11-17 LAB — PROTIME-INR
INR: 1
Prothrombin Time: 13.8

## 2010-11-17 LAB — COMPREHENSIVE METABOLIC PANEL
ALT: 18
AST: 14
Albumin: 2.9 — ABNORMAL LOW
Calcium: 9.6
Creatinine, Ser: 7.43 — ABNORMAL HIGH
GFR calc Af Amer: 7 — ABNORMAL LOW
Sodium: 140
Total Protein: 6.8

## 2010-11-17 LAB — POCT I-STAT 4, (NA,K, GLUC, HGB,HCT)
Glucose, Bld: 234 — ABNORMAL HIGH
Operator id: 167851
Potassium: 4.2
Sodium: 139

## 2010-11-17 LAB — RENAL FUNCTION PANEL
Albumin: 2.7 — ABNORMAL LOW
BUN: 66 — ABNORMAL HIGH
CO2: 22
CO2: 23
Calcium: 8.4
Calcium: 8.9
Calcium: 9.1
Creatinine, Ser: 5.98 — ABNORMAL HIGH
Creatinine, Ser: 8.45 — ABNORMAL HIGH
Creatinine, Ser: 8.72 — ABNORMAL HIGH
GFR calc Af Amer: 6 — ABNORMAL LOW
GFR calc Af Amer: 9 — ABNORMAL LOW
GFR calc non Af Amer: 7 — ABNORMAL LOW
Glucose, Bld: 90
Phosphorus: 7.1 — ABNORMAL HIGH
Phosphorus: 9.6 — ABNORMAL HIGH
Potassium: 4.7

## 2010-11-17 LAB — CULTURE, BLOOD (ROUTINE X 2): Culture: NO GROWTH

## 2010-11-17 LAB — APTT: aPTT: 26

## 2010-11-19 LAB — RENAL FUNCTION PANEL
CO2: 23
Calcium: 8.4
GFR calc Af Amer: 8 — ABNORMAL LOW
GFR calc non Af Amer: 6 — ABNORMAL LOW
Phosphorus: 7.3 — ABNORMAL HIGH
Sodium: 137

## 2010-11-19 LAB — CBC
MCHC: 31.6
RBC: 4.29
WBC: 6.3

## 2010-11-19 LAB — POCT I-STAT 4, (NA,K, GLUC, HGB,HCT)
Glucose, Bld: 111 — ABNORMAL HIGH
HCT: 49 — ABNORMAL HIGH
Operator id: 181601
Sodium: 137

## 2010-11-19 LAB — HEMOGLOBIN A1C: Mean Plasma Glucose: 161

## 2010-11-24 LAB — CULTURE, BLOOD (ROUTINE X 2)
Culture: NO GROWTH
Culture: NO GROWTH
Culture: NO GROWTH
Culture: NO GROWTH

## 2010-11-24 LAB — URINALYSIS, ROUTINE W REFLEX MICROSCOPIC
Glucose, UA: 250 — AB
Protein, ur: 100 — AB
Specific Gravity, Urine: 1.017
Urobilinogen, UA: 0.2

## 2010-11-24 LAB — BASIC METABOLIC PANEL
BUN: 30 — ABNORMAL HIGH
BUN: 43 — ABNORMAL HIGH
BUN: 46 — ABNORMAL HIGH
CO2: 22
CO2: 23
CO2: 27
Calcium: 8.4
Calcium: 8.6
Calcium: 8.9
Chloride: 102
Chloride: 98
Creatinine, Ser: 6.07 — ABNORMAL HIGH
Creatinine, Ser: 9.29 — ABNORMAL HIGH
GFR calc Af Amer: 10 — ABNORMAL LOW
GFR calc Af Amer: 10 — ABNORMAL LOW
GFR calc Af Amer: 5 — ABNORMAL LOW
GFR calc Af Amer: 5 — ABNORMAL LOW
GFR calc non Af Amer: 4 — ABNORMAL LOW
GFR calc non Af Amer: 6 — ABNORMAL LOW
GFR calc non Af Amer: 7 — ABNORMAL LOW
GFR calc non Af Amer: 8 — ABNORMAL LOW
GFR calc non Af Amer: 8 — ABNORMAL LOW
Glucose, Bld: 192 — ABNORMAL HIGH
Glucose, Bld: 249 — ABNORMAL HIGH
Glucose, Bld: 269 — ABNORMAL HIGH
Glucose, Bld: 333 — ABNORMAL HIGH
Glucose, Bld: 348 — ABNORMAL HIGH
Glucose, Bld: 356 — ABNORMAL HIGH
Potassium: 3.1 — ABNORMAL LOW
Potassium: 3.5
Potassium: 5.2 — ABNORMAL HIGH
Potassium: 5.4 — ABNORMAL HIGH
Potassium: 7.3
Sodium: 135
Sodium: 135
Sodium: 135
Sodium: 137
Sodium: 138
Sodium: 138

## 2010-11-24 LAB — CBC
HCT: 31.5 — ABNORMAL LOW
HCT: 33 — ABNORMAL LOW
HCT: 34.7 — ABNORMAL LOW
HCT: 35.1 — ABNORMAL LOW
HCT: 36.4
HCT: 38
HCT: 41.1
HCT: 41.1
Hemoglobin: 10.1 — ABNORMAL LOW
Hemoglobin: 10.2 — ABNORMAL LOW
Hemoglobin: 11.1 — ABNORMAL LOW
Hemoglobin: 11.3 — ABNORMAL LOW
Hemoglobin: 11.6 — ABNORMAL LOW
Hemoglobin: 11.9 — ABNORMAL LOW
Hemoglobin: 13.6
MCHC: 31.2
MCHC: 31.4
MCHC: 32
MCHC: 32
MCHC: 32.1
MCHC: 32.2
MCHC: 33
MCV: 100.7 — ABNORMAL HIGH
MCV: 98.3
MCV: 99.1
MCV: 99.4
Platelets: 175
Platelets: 239
Platelets: 336
Platelets: 341
Platelets: 355
Platelets: 411 — ABNORMAL HIGH
Platelets: ADEQUATE
RBC: 3.23 — ABNORMAL LOW
RBC: 3.25 — ABNORMAL LOW
RBC: 3.53 — ABNORMAL LOW
RBC: 3.55 — ABNORMAL LOW
RBC: 3.58 — ABNORMAL LOW
RBC: 3.66 — ABNORMAL LOW
RBC: 3.73 — ABNORMAL LOW
RBC: 3.83 — ABNORMAL LOW
RDW: 16.3 — ABNORMAL HIGH
RDW: 16.5 — ABNORMAL HIGH
RDW: 16.6 — ABNORMAL HIGH
RDW: 16.9 — ABNORMAL HIGH
RDW: 17 — ABNORMAL HIGH
RDW: 17.1 — ABNORMAL HIGH
RDW: 17.3 — ABNORMAL HIGH
RDW: 17.5 — ABNORMAL HIGH
RDW: 17.5 — ABNORMAL HIGH
WBC: 10.1
WBC: 10.1
WBC: 11.4 — ABNORMAL HIGH
WBC: 11.7 — ABNORMAL HIGH
WBC: 12.3 — ABNORMAL HIGH
WBC: 9.4
WBC: 9.6

## 2010-11-24 LAB — DIFFERENTIAL
Basophils Absolute: 0
Basophils Relative: 0
Basophils Relative: 0
Eosinophils Absolute: 0.1
Eosinophils Absolute: 0.2
Eosinophils Relative: 0
Lymphocytes Relative: 19
Lymphs Abs: 1.4
Lymphs Abs: 1.8
Monocytes Absolute: 1.3 — ABNORMAL HIGH
Monocytes Absolute: 1.7 — ABNORMAL HIGH
Monocytes Absolute: 1.9 — ABNORMAL HIGH
Monocytes Relative: 14 — ABNORMAL HIGH
Monocytes Relative: 16 — ABNORMAL HIGH
Monocytes Relative: 8
Neutro Abs: 6.1
Neutrophils Relative %: 57
Neutrophils Relative %: 65
Neutrophils Relative %: 71

## 2010-11-24 LAB — POCT I-STAT 3, ART BLOOD GAS (G3+)
Bicarbonate: 19.2 — ABNORMAL LOW
pCO2 arterial: 44.8
pH, Arterial: 7.24 — ABNORMAL LOW
pO2, Arterial: 102 — ABNORMAL HIGH

## 2010-11-24 LAB — GLUCOSE, CAPILLARY
Glucose-Capillary: 134 — ABNORMAL HIGH
Glucose-Capillary: 139 — ABNORMAL HIGH
Glucose-Capillary: 139 — ABNORMAL HIGH
Glucose-Capillary: 146 — ABNORMAL HIGH
Glucose-Capillary: 149 — ABNORMAL HIGH
Glucose-Capillary: 149 — ABNORMAL HIGH
Glucose-Capillary: 164 — ABNORMAL HIGH
Glucose-Capillary: 172 — ABNORMAL HIGH
Glucose-Capillary: 175 — ABNORMAL HIGH
Glucose-Capillary: 176 — ABNORMAL HIGH
Glucose-Capillary: 177 — ABNORMAL HIGH
Glucose-Capillary: 188 — ABNORMAL HIGH
Glucose-Capillary: 194 — ABNORMAL HIGH
Glucose-Capillary: 208 — ABNORMAL HIGH
Glucose-Capillary: 214 — ABNORMAL HIGH
Glucose-Capillary: 216 — ABNORMAL HIGH
Glucose-Capillary: 221 — ABNORMAL HIGH
Glucose-Capillary: 221 — ABNORMAL HIGH
Glucose-Capillary: 233 — ABNORMAL HIGH
Glucose-Capillary: 233 — ABNORMAL HIGH
Glucose-Capillary: 233 — ABNORMAL HIGH
Glucose-Capillary: 240 — ABNORMAL HIGH
Glucose-Capillary: 242 — ABNORMAL HIGH
Glucose-Capillary: 243 — ABNORMAL HIGH
Glucose-Capillary: 243 — ABNORMAL HIGH
Glucose-Capillary: 243 — ABNORMAL HIGH
Glucose-Capillary: 250 — ABNORMAL HIGH
Glucose-Capillary: 254 — ABNORMAL HIGH
Glucose-Capillary: 272 — ABNORMAL HIGH
Glucose-Capillary: 283 — ABNORMAL HIGH
Glucose-Capillary: 346 — ABNORMAL HIGH

## 2010-11-24 LAB — RENAL FUNCTION PANEL
Albumin: 2.3 — ABNORMAL LOW
Albumin: 2.4 — ABNORMAL LOW
Albumin: 2.4 — ABNORMAL LOW
Albumin: 2.5 — ABNORMAL LOW
BUN: 32 — ABNORMAL HIGH
BUN: 73 — ABNORMAL HIGH
CO2: 22
CO2: 23
CO2: 25
Calcium: 8.7
Calcium: 8.8
Calcium: 9.1
Chloride: 101
Chloride: 101
Chloride: 99
Creatinine, Ser: 8.26 — ABNORMAL HIGH
Creatinine, Ser: 8.79 — ABNORMAL HIGH
GFR calc Af Amer: 4 — ABNORMAL LOW
GFR calc Af Amer: 6 — ABNORMAL LOW
GFR calc Af Amer: 6 — ABNORMAL LOW
GFR calc Af Amer: 6 — ABNORMAL LOW
GFR calc Af Amer: 8 — ABNORMAL LOW
GFR calc non Af Amer: 3 — ABNORMAL LOW
GFR calc non Af Amer: 5 — ABNORMAL LOW
GFR calc non Af Amer: 5 — ABNORMAL LOW
GFR calc non Af Amer: 5 — ABNORMAL LOW
GFR calc non Af Amer: 7 — ABNORMAL LOW
Glucose, Bld: 220 — ABNORMAL HIGH
Phosphorus: 5.8 — ABNORMAL HIGH
Phosphorus: 5.9 — ABNORMAL HIGH
Phosphorus: 6.2 — ABNORMAL HIGH
Phosphorus: 8.6 — ABNORMAL HIGH
Potassium: 3.9
Potassium: 4.7
Potassium: 4.9
Potassium: 6.4
Sodium: 135
Sodium: 136
Sodium: 138
Sodium: 141

## 2010-11-24 LAB — POCT CARDIAC MARKERS: CKMB, poc: 2.1

## 2010-11-24 LAB — URINE CULTURE
Colony Count: >=100000
Special Requests: NEGATIVE

## 2010-11-24 LAB — CULTURE, RESPIRATORY W GRAM STAIN

## 2010-11-24 LAB — CATH TIP CULTURE

## 2010-11-24 LAB — LIPID PANEL
Cholesterol: 210 — ABNORMAL HIGH
HDL: 33 — ABNORMAL LOW
Triglycerides: 104

## 2010-11-24 LAB — PROTIME-INR: INR: 1.3

## 2010-11-24 LAB — URINE MICROSCOPIC-ADD ON

## 2010-11-24 LAB — POCT I-STAT, CHEM 8
BUN: 35 — ABNORMAL HIGH
Creatinine, Ser: 5.3 — ABNORMAL HIGH
Glucose, Bld: 351 — ABNORMAL HIGH
Sodium: 136
TCO2: 21

## 2010-11-24 LAB — APTT: aPTT: >200

## 2010-11-24 LAB — PREGNANCY, URINE: Preg Test, Ur: NEGATIVE

## 2010-11-24 LAB — SEDIMENTATION RATE
Sed Rate: 10
Sed Rate: 23 — ABNORMAL HIGH

## 2010-11-25 LAB — GLUCOSE, CAPILLARY
Glucose-Capillary: 102 — ABNORMAL HIGH
Glucose-Capillary: 104 — ABNORMAL HIGH
Glucose-Capillary: 107 — ABNORMAL HIGH
Glucose-Capillary: 111 — ABNORMAL HIGH
Glucose-Capillary: 113 — ABNORMAL HIGH
Glucose-Capillary: 114 — ABNORMAL HIGH
Glucose-Capillary: 114 — ABNORMAL HIGH
Glucose-Capillary: 118 — ABNORMAL HIGH
Glucose-Capillary: 122 — ABNORMAL HIGH
Glucose-Capillary: 123 — ABNORMAL HIGH
Glucose-Capillary: 124 — ABNORMAL HIGH
Glucose-Capillary: 125 — ABNORMAL HIGH
Glucose-Capillary: 125 — ABNORMAL HIGH
Glucose-Capillary: 130 — ABNORMAL HIGH
Glucose-Capillary: 136 — ABNORMAL HIGH
Glucose-Capillary: 137 — ABNORMAL HIGH
Glucose-Capillary: 141 — ABNORMAL HIGH
Glucose-Capillary: 142 — ABNORMAL HIGH
Glucose-Capillary: 143 — ABNORMAL HIGH
Glucose-Capillary: 145 — ABNORMAL HIGH
Glucose-Capillary: 146 — ABNORMAL HIGH
Glucose-Capillary: 147 — ABNORMAL HIGH
Glucose-Capillary: 147 — ABNORMAL HIGH
Glucose-Capillary: 150 — ABNORMAL HIGH
Glucose-Capillary: 153 — ABNORMAL HIGH
Glucose-Capillary: 156 — ABNORMAL HIGH
Glucose-Capillary: 162 — ABNORMAL HIGH
Glucose-Capillary: 165 — ABNORMAL HIGH
Glucose-Capillary: 177 — ABNORMAL HIGH
Glucose-Capillary: 179 — ABNORMAL HIGH
Glucose-Capillary: 179 — ABNORMAL HIGH
Glucose-Capillary: 181 — ABNORMAL HIGH
Glucose-Capillary: 183 — ABNORMAL HIGH
Glucose-Capillary: 183 — ABNORMAL HIGH
Glucose-Capillary: 193 — ABNORMAL HIGH
Glucose-Capillary: 196 — ABNORMAL HIGH
Glucose-Capillary: 205 — ABNORMAL HIGH
Glucose-Capillary: 217 — ABNORMAL HIGH
Glucose-Capillary: 231 — ABNORMAL HIGH
Glucose-Capillary: 252 — ABNORMAL HIGH
Glucose-Capillary: 70
Glucose-Capillary: 94
Glucose-Capillary: 97

## 2010-11-25 LAB — CBC
HCT: 28.3 — ABNORMAL LOW
HCT: 28.5 — ABNORMAL LOW
HCT: 30.3 — ABNORMAL LOW
HCT: 31 — ABNORMAL LOW
HCT: 31.6 — ABNORMAL LOW
HCT: 31.6 — ABNORMAL LOW
HCT: 32.8 — ABNORMAL LOW
HCT: 33.4 — ABNORMAL LOW
HCT: 35 — ABNORMAL LOW
Hemoglobin: 10 — ABNORMAL LOW
Hemoglobin: 10.4 — ABNORMAL LOW
Hemoglobin: 10.5 — ABNORMAL LOW
Hemoglobin: 10.7 — ABNORMAL LOW
Hemoglobin: 10.9 — ABNORMAL LOW
Hemoglobin: 9 — ABNORMAL LOW
Hemoglobin: 9.6 — ABNORMAL LOW
Hemoglobin: 9.9 — ABNORMAL LOW
MCHC: 31.2
MCHC: 31.3
MCHC: 31.6
MCHC: 31.8
MCHC: 32
MCHC: 32.3
MCHC: 32.4
MCV: 94.5
MCV: 95.6
MCV: 96.9
MCV: 98
MCV: 98.6
MCV: 98.8
MCV: 98.9
MCV: 99.9
Platelets: 196
Platelets: 207
Platelets: 208
Platelets: 231
Platelets: 321
Platelets: 334
Platelets: 388
Platelets: 415 — ABNORMAL HIGH
RBC: 2.87 — ABNORMAL LOW
RBC: 3.21 — ABNORMAL LOW
RBC: 3.27 — ABNORMAL LOW
RBC: 3.28 — ABNORMAL LOW
RBC: 3.32 — ABNORMAL LOW
RBC: 3.35 — ABNORMAL LOW
RBC: 3.57 — ABNORMAL LOW
RDW: 17.2 — ABNORMAL HIGH
RDW: 17.2 — ABNORMAL HIGH
RDW: 19.4 — ABNORMAL HIGH
RDW: 19.5 — ABNORMAL HIGH
RDW: 19.7 — ABNORMAL HIGH
RDW: 19.8 — ABNORMAL HIGH
RDW: 19.9 — ABNORMAL HIGH
RDW: 20 — ABNORMAL HIGH
WBC: 10.8 — ABNORMAL HIGH
WBC: 11.2 — ABNORMAL HIGH
WBC: 15.1 — ABNORMAL HIGH
WBC: 8.5
WBC: 9.1
WBC: 9.1
WBC: 9.4
WBC: 9.5

## 2010-11-25 LAB — DIFFERENTIAL
Basophils Absolute: 0
Basophils Absolute: 0
Basophils Absolute: 0
Basophils Absolute: 0
Basophils Absolute: 0
Basophils Absolute: 0
Basophils Absolute: 0
Basophils Relative: 0
Basophils Relative: 0
Basophils Relative: 0
Basophils Relative: 0
Basophils Relative: 0
Basophils Relative: 0
Eosinophils Absolute: 0.2
Eosinophils Absolute: 0.3
Eosinophils Absolute: 0.4
Eosinophils Absolute: 0.4
Eosinophils Absolute: 0.4
Eosinophils Absolute: 0.5
Eosinophils Absolute: 0.5
Eosinophils Absolute: 0.6
Eosinophils Relative: 1
Eosinophils Relative: 2
Eosinophils Relative: 3
Eosinophils Relative: 4
Eosinophils Relative: 4
Eosinophils Relative: 4
Eosinophils Relative: 6 — ABNORMAL HIGH
Eosinophils Relative: 6 — ABNORMAL HIGH
Lymphocytes Relative: 10 — ABNORMAL LOW
Lymphocytes Relative: 13
Lymphocytes Relative: 18
Lymphocytes Relative: 20
Lymphocytes Relative: 22
Lymphocytes Relative: 24
Lymphocytes Relative: 8 — ABNORMAL LOW
Lymphs Abs: 0.8
Lymphs Abs: 1.1
Lymphs Abs: 1.5
Lymphs Abs: 1.7
Lymphs Abs: 1.7
Lymphs Abs: 2
Lymphs Abs: 2.1
Monocytes Absolute: 0.8
Monocytes Absolute: 0.8
Monocytes Absolute: 0.9
Monocytes Absolute: 0.9
Monocytes Absolute: 1.1 — ABNORMAL HIGH
Monocytes Absolute: 1.2 — ABNORMAL HIGH
Monocytes Relative: 10
Monocytes Relative: 10
Monocytes Relative: 11
Monocytes Relative: 12
Monocytes Relative: 7
Monocytes Relative: 7
Monocytes Relative: 9
Neutro Abs: 11.8 — ABNORMAL HIGH
Neutro Abs: 16.2 — ABNORMAL HIGH
Neutro Abs: 5.1
Neutro Abs: 5.4
Neutro Abs: 5.8
Neutro Abs: 5.9
Neutro Abs: 8.5 — ABNORMAL HIGH
Neutrophils Relative %: 60
Neutrophils Relative %: 62
Neutrophils Relative %: 64
Neutrophils Relative %: 64
Neutrophils Relative %: 76
Neutrophils Relative %: 83 — ABNORMAL HIGH

## 2010-11-25 LAB — BASIC METABOLIC PANEL
BUN: 18
BUN: 21
BUN: 23
BUN: 23
BUN: 24 — ABNORMAL HIGH
BUN: 24 — ABNORMAL HIGH
BUN: 26 — ABNORMAL HIGH
BUN: 27 — ABNORMAL HIGH
BUN: 27 — ABNORMAL HIGH
BUN: 31 — ABNORMAL HIGH
BUN: 33 — ABNORMAL HIGH
CO2: 15 — ABNORMAL LOW
CO2: 17 — ABNORMAL LOW
CO2: 17 — ABNORMAL LOW
CO2: 26
Calcium: 8 — ABNORMAL LOW
Calcium: 8.1 — ABNORMAL LOW
Calcium: 8.2 — ABNORMAL LOW
Calcium: 8.4
Calcium: 8.6
Calcium: 8.7
Calcium: 9.3
Chloride: 101
Chloride: 102
Chloride: 107
Chloride: 108
Chloride: 108
Chloride: 109
Chloride: 110
Creatinine, Ser: 4.07 — ABNORMAL HIGH
Creatinine, Ser: 4.6 — ABNORMAL HIGH
Creatinine, Ser: 4.74 — ABNORMAL HIGH
Creatinine, Ser: 4.93 — ABNORMAL HIGH
GFR calc Af Amer: 10 — ABNORMAL LOW
GFR calc Af Amer: 10 — ABNORMAL LOW
GFR calc Af Amer: 10 — ABNORMAL LOW
GFR calc Af Amer: 11 — ABNORMAL LOW
GFR calc Af Amer: 15 — ABNORMAL LOW
GFR calc non Af Amer: 11 — ABNORMAL LOW
GFR calc non Af Amer: 11 — ABNORMAL LOW
GFR calc non Af Amer: 11 — ABNORMAL LOW
GFR calc non Af Amer: 12 — ABNORMAL LOW
GFR calc non Af Amer: 8 — ABNORMAL LOW
GFR calc non Af Amer: 8 — ABNORMAL LOW
GFR calc non Af Amer: 9 — ABNORMAL LOW
Glucose, Bld: 102 — ABNORMAL HIGH
Glucose, Bld: 110 — ABNORMAL HIGH
Glucose, Bld: 121 — ABNORMAL HIGH
Glucose, Bld: 142 — ABNORMAL HIGH
Glucose, Bld: 144 — ABNORMAL HIGH
Glucose, Bld: 211 — ABNORMAL HIGH
Glucose, Bld: 218 — ABNORMAL HIGH
Glucose, Bld: 218 — ABNORMAL HIGH
Glucose, Bld: 70
Potassium: 3.2 — ABNORMAL LOW
Potassium: 3.3 — ABNORMAL LOW
Potassium: 3.4 — ABNORMAL LOW
Potassium: 3.5
Potassium: 3.6
Potassium: 3.6
Potassium: 3.8
Sodium: 136
Sodium: 138
Sodium: 138
Sodium: 141
Sodium: 148 — ABNORMAL HIGH

## 2010-11-25 LAB — BLOOD GAS, ARTERIAL
Acid-base deficit: 1.2
Acid-base deficit: 10.5 — ABNORMAL HIGH
Acid-base deficit: 5 — ABNORMAL HIGH
Acid-base deficit: 8.8 — ABNORMAL HIGH
Bicarbonate: 15.8 — ABNORMAL LOW
Bicarbonate: 18.1 — ABNORMAL LOW
FIO2: 0.4
FIO2: 0.4
FIO2: 0.4
FIO2: 0.4
MECHVT: 500
MECHVT: 500
MECHVT: 600
O2 Saturation: 96.3
O2 Saturation: 96.6
O2 Saturation: 96.8
PEEP: 5
Patient temperature: 99
RATE: 16
TCO2: 15.1
TCO2: 16.7
TCO2: 18.9
TCO2: 24.6
pCO2 arterial: 25.3 — ABNORMAL LOW
pCO2 arterial: 41.5
pH, Arterial: 7.468 — ABNORMAL HIGH
pO2, Arterial: 105 — ABNORMAL HIGH
pO2, Arterial: 138 — ABNORMAL HIGH
pO2, Arterial: 99.5

## 2010-11-25 LAB — COMPREHENSIVE METABOLIC PANEL
ALT: 14
ALT: 55 — ABNORMAL HIGH
Albumin: 2.1 — ABNORMAL LOW
Alkaline Phosphatase: 93
BUN: 25 — ABNORMAL HIGH
BUN: 32 — ABNORMAL HIGH
CO2: 18 — ABNORMAL LOW
Calcium: 8.3 — ABNORMAL LOW
Chloride: 100
Creatinine, Ser: 4.89 — ABNORMAL HIGH
GFR calc non Af Amer: 7 — ABNORMAL LOW
Glucose, Bld: 141 — ABNORMAL HIGH
Glucose, Bld: 178 — ABNORMAL HIGH
Glucose, Bld: 193 — ABNORMAL HIGH
Potassium: 4.3
Sodium: 136
Sodium: 139
Total Bilirubin: 1.1
Total Protein: 5.9 — ABNORMAL LOW
Total Protein: 6.1
Total Protein: 6.1

## 2010-11-25 LAB — CROSSMATCH: Antibody Screen: NEGATIVE

## 2010-11-25 LAB — HEPARIN LEVEL (UNFRACTIONATED)
Heparin Unfractionated: 0.38
Heparin Unfractionated: 0.39
Heparin Unfractionated: 0.41
Heparin Unfractionated: 0.42

## 2010-11-25 LAB — CK: Total CK: 19

## 2010-11-25 LAB — CARBOXYHEMOGLOBIN
Carboxyhemoglobin: 0.6
Methemoglobin: 1

## 2010-11-25 LAB — CARDIAC PANEL(CRET KIN+CKTOT+MB+TROPI)
CK, MB: 24.5 — ABNORMAL HIGH
Relative Index: 13.6 — ABNORMAL HIGH
Relative Index: 19 — ABNORMAL HIGH
Relative Index: INVALID
Total CK: 180 — ABNORMAL HIGH
Total CK: 34
Troponin I: 2.3

## 2010-11-25 LAB — RENAL FUNCTION PANEL
Albumin: 2.1 — ABNORMAL LOW
BUN: 39 — ABNORMAL HIGH
CO2: 26
Calcium: 8.8
Chloride: 99
Creatinine, Ser: 6.47 — ABNORMAL HIGH
GFR calc Af Amer: 8 — ABNORMAL LOW
GFR calc non Af Amer: 7 — ABNORMAL LOW
Glucose, Bld: 142 — ABNORMAL HIGH
Phosphorus: 7.2 — ABNORMAL HIGH
Potassium: 4.9
Sodium: 136

## 2010-11-25 LAB — PHOSPHORUS: Phosphorus: 7.2 — ABNORMAL HIGH

## 2010-11-25 LAB — VANCOMYCIN, RANDOM: Vancomycin Rm: 26

## 2010-11-25 LAB — TRIGLYCERIDES
Triglycerides: 120
Triglycerides: 122

## 2010-11-25 LAB — MAGNESIUM: Magnesium: 1.8

## 2010-11-25 LAB — APTT: aPTT: 38 — ABNORMAL HIGH

## 2010-11-28 LAB — CBC
HCT: 25.6 — ABNORMAL LOW
HCT: 30.8 — ABNORMAL LOW
HCT: 31.1 — ABNORMAL LOW
HCT: 32.7 — ABNORMAL LOW
HCT: 32.9 — ABNORMAL LOW
HCT: 32.9 — ABNORMAL LOW
Hemoglobin: 10 — ABNORMAL LOW
Hemoglobin: 10.4 — ABNORMAL LOW
Hemoglobin: 10.6 — ABNORMAL LOW
Hemoglobin: 8 — ABNORMAL LOW
Hemoglobin: 9.8 — ABNORMAL LOW
MCHC: 31.5
MCHC: 31.7
MCHC: 31.8
MCHC: 31.8
MCHC: 31.8
MCHC: 32.3
MCHC: 32.8
MCV: 91.8
MCV: 92.2
MCV: 92.6
MCV: 93.1
MCV: 93.2
MCV: 93.5
Platelets: 355
Platelets: 357
Platelets: 373
Platelets: 395
Platelets: 415 — ABNORMAL HIGH
RBC: 3.26 — ABNORMAL LOW
RBC: 3.31 — ABNORMAL LOW
RBC: 3.33 — ABNORMAL LOW
RBC: 3.44 — ABNORMAL LOW
RBC: 3.47 — ABNORMAL LOW
RBC: 3.5 — ABNORMAL LOW
RBC: 3.57 — ABNORMAL LOW
RDW: 18.9 — ABNORMAL HIGH
RDW: 19.6 — ABNORMAL HIGH
RDW: 19.9 — ABNORMAL HIGH
RDW: 20 — ABNORMAL HIGH
RDW: 20 — ABNORMAL HIGH
WBC: 10.3
WBC: 10.4
WBC: 10.6 — ABNORMAL HIGH
WBC: 13.8 — ABNORMAL HIGH
WBC: 9.5
WBC: 9.8

## 2010-11-28 LAB — POCT I-STAT 4, (NA,K, GLUC, HGB,HCT)
HCT: 34 — ABNORMAL LOW
Operator id: 167851

## 2010-11-28 LAB — RENAL FUNCTION PANEL
Albumin: 2.2 — ABNORMAL LOW
BUN: 30 — ABNORMAL HIGH
BUN: 35 — ABNORMAL HIGH
CO2: 23
CO2: 23
CO2: 28
Calcium: 8.9
Calcium: 9.3
Chloride: 100
Chloride: 99
Creatinine, Ser: 5.52 — ABNORMAL HIGH
Creatinine, Ser: 6.1 — ABNORMAL HIGH
GFR calc Af Amer: 10 — ABNORMAL LOW
GFR calc Af Amer: 7 — ABNORMAL LOW
GFR calc Af Amer: 9 — ABNORMAL LOW
GFR calc non Af Amer: 6 — ABNORMAL LOW
GFR calc non Af Amer: 7 — ABNORMAL LOW
GFR calc non Af Amer: 8 — ABNORMAL LOW
Glucose, Bld: 153 — ABNORMAL HIGH
Glucose, Bld: 163 — ABNORMAL HIGH
Glucose, Bld: 60 — ABNORMAL LOW
Phosphorus: 5 — ABNORMAL HIGH
Phosphorus: 6.2 — ABNORMAL HIGH
Phosphorus: 7.2 — ABNORMAL HIGH
Potassium: 4
Potassium: 5.6 — ABNORMAL HIGH
Sodium: 129 — ABNORMAL LOW
Sodium: 133 — ABNORMAL LOW
Sodium: 135
Sodium: 137

## 2010-11-28 LAB — BASIC METABOLIC PANEL
BUN: 45 — ABNORMAL HIGH
BUN: 45 — ABNORMAL HIGH
BUN: 58 — ABNORMAL HIGH
CO2: 19
CO2: 22
CO2: 23
CO2: 26
Calcium: 9.2
Chloride: 100
Chloride: 96
Chloride: 97
Creatinine, Ser: 4.65 — ABNORMAL HIGH
Creatinine, Ser: 6.6 — ABNORMAL HIGH
Creatinine, Ser: 7.02 — ABNORMAL HIGH
GFR calc Af Amer: 12 — ABNORMAL LOW
Glucose, Bld: 146 — ABNORMAL HIGH
Glucose, Bld: 148 — ABNORMAL HIGH
Glucose, Bld: 152 — ABNORMAL HIGH
Potassium: 4.5
Potassium: 4.7
Sodium: 136

## 2010-11-28 LAB — DIFFERENTIAL
Basophils Relative: 0
Eosinophils Absolute: 0.2
Eosinophils Relative: 1
Monocytes Absolute: 1.1 — ABNORMAL HIGH
Monocytes Relative: 7
Neutrophils Relative %: 82 — ABNORMAL HIGH

## 2010-11-28 LAB — CLOSTRIDIUM DIFFICILE EIA

## 2010-11-28 LAB — IRON AND TIBC
Iron: 53
Saturation Ratios: 32
TIBC: 166 — ABNORMAL LOW
UIBC: 113

## 2010-11-28 LAB — CROSSMATCH

## 2010-11-28 LAB — HEPATIC FUNCTION PANEL
ALT: 13
AST: 18
Total Protein: 6.7

## 2010-11-28 LAB — FERRITIN: Ferritin: 877 — ABNORMAL HIGH (ref 10–291)

## 2010-11-28 LAB — STOOL CULTURE

## 2010-12-01 LAB — BASIC METABOLIC PANEL
BUN: 20
BUN: 33 — ABNORMAL HIGH
CO2: 27
Chloride: 97
Chloride: 99
Chloride: 99
Creatinine, Ser: 3.53 — ABNORMAL HIGH
Creatinine, Ser: 3.62 — ABNORMAL HIGH
GFR calc Af Amer: 16 — ABNORMAL LOW
GFR calc non Af Amer: 13 — ABNORMAL LOW
GFR calc non Af Amer: 9 — ABNORMAL LOW
Glucose, Bld: 112 — ABNORMAL HIGH
Potassium: 4
Potassium: 4.3
Sodium: 135
Sodium: 137

## 2010-12-01 LAB — CBC
HCT: 26 — ABNORMAL LOW
HCT: 27 — ABNORMAL LOW
HCT: 27.4 — ABNORMAL LOW
HCT: 28.5 — ABNORMAL LOW
Hemoglobin: 8.2 — ABNORMAL LOW
Hemoglobin: 8.5 — ABNORMAL LOW
Hemoglobin: 8.8 — ABNORMAL LOW
MCHC: 30.3
MCHC: 31.2
MCV: 89.6
MCV: 90.2
MCV: 90.4
MCV: 90.9
Platelets: 356
Platelets: 447 — ABNORMAL HIGH
RBC: 2.81 — ABNORMAL LOW
RBC: 2.87 — ABNORMAL LOW
RBC: 3.03 — ABNORMAL LOW
RDW: 19.9 — ABNORMAL HIGH
RDW: 20.2 — ABNORMAL HIGH
RDW: 21.1 — ABNORMAL HIGH
WBC: 13 — ABNORMAL HIGH
WBC: 15.1 — ABNORMAL HIGH
WBC: 16.2 — ABNORMAL HIGH

## 2010-12-01 LAB — IRON AND TIBC
Iron: 13 — ABNORMAL LOW
Saturation Ratios: 13 — ABNORMAL LOW
Saturation Ratios: 19 — ABNORMAL LOW
TIBC: 104 — ABNORMAL LOW
TIBC: 111 — ABNORMAL LOW
UIBC: 90

## 2010-12-01 LAB — RENAL FUNCTION PANEL
BUN: 35 — ABNORMAL HIGH
CO2: 20
CO2: 25
Calcium: 8.9
Chloride: 97
GFR calc Af Amer: 13 — ABNORMAL LOW
GFR calc non Af Amer: 11 — ABNORMAL LOW
Glucose, Bld: 127 — ABNORMAL HIGH
Glucose, Bld: 162 — ABNORMAL HIGH
Sodium: 131 — ABNORMAL LOW
Sodium: 131 — ABNORMAL LOW

## 2010-12-01 LAB — I-STAT 8, (EC8 V) (CONVERTED LAB)
Acid-Base Excess: 1
Bicarbonate: 25.6 — ABNORMAL HIGH
HCT: 29 — ABNORMAL LOW
Hemoglobin: 9.9 — ABNORMAL LOW
Operator id: 234501
Sodium: 134 — ABNORMAL LOW
TCO2: 27

## 2010-12-01 LAB — WOUND CULTURE

## 2010-12-01 LAB — CULTURE, BLOOD (ROUTINE X 2)
Culture: NO GROWTH
Culture: NO GROWTH

## 2010-12-01 LAB — DIFFERENTIAL
Basophils Absolute: 0
Eosinophils Absolute: 0.1 — ABNORMAL LOW
Eosinophils Relative: 1
Monocytes Absolute: 1.4 — ABNORMAL HIGH
Neutrophils Relative %: 74

## 2010-12-01 LAB — GRAM STAIN

## 2010-12-01 LAB — COMPREHENSIVE METABOLIC PANEL
ALT: 22
AST: 20
Alkaline Phosphatase: 65
Calcium: 8.6
GFR calc Af Amer: 8 — ABNORMAL LOW
Glucose, Bld: 235 — ABNORMAL HIGH
Potassium: 3.6
Sodium: 132 — ABNORMAL LOW
Total Protein: 6.6

## 2010-12-01 LAB — CROSSMATCH: ABO/RH(D): O POS

## 2010-12-01 LAB — LIPID PANEL
LDL Cholesterol: 88
Total CHOL/HDL Ratio: 4.3
VLDL: 19

## 2010-12-01 LAB — FERRITIN: Ferritin: 968 — ABNORMAL HIGH (ref 10–291)

## 2010-12-01 LAB — HEMOGLOBIN A1C: Hgb A1c MFr Bld: 5.9

## 2010-12-01 LAB — PHOSPHORUS
Phosphorus: 4
Phosphorus: 5.6 — ABNORMAL HIGH
Phosphorus: 6.3 — ABNORMAL HIGH

## 2010-12-01 LAB — RETICULOCYTES: Retic Count, Absolute: 78.1

## 2010-12-01 LAB — FOLATE: Folate: >20

## 2010-12-03 LAB — CARDIAC PANEL(CRET KIN+CKTOT+MB+TROPI)
CK, MB: 2.6
CK, MB: 5.7 — ABNORMAL HIGH
CK, MB: 7 — ABNORMAL HIGH
Relative Index: 0.6
Relative Index: 1.1
Relative Index: 1.4
Total CK: 490 — ABNORMAL HIGH
Troponin I: 0.75
Troponin I: 1.42

## 2010-12-03 LAB — CBC
HCT: 27.2 — ABNORMAL LOW
HCT: 31.3 — ABNORMAL LOW
HCT: 39.6
HCT: 42
HCT: 42.2
HCT: 42.9
HCT: 43.3
Hemoglobin: 13.3
Hemoglobin: 13.5
Hemoglobin: 13.6
Hemoglobin: 13.6
MCHC: 31.3
MCHC: 31.5
MCHC: 31.7
MCHC: 31.8
MCHC: 32.2
MCV: 94.9
MCV: 95
MCV: 95.1
MCV: 95.6
MCV: 96.5
MCV: 96.6
MCV: 96.8
MCV: 97.4
Platelets: 165
Platelets: 190
Platelets: 216
Platelets: 231
Platelets: 244
Platelets: 254
Platelets: 301
Platelets: 319
Platelets: 336
RBC: 2.76 — ABNORMAL LOW
RBC: 4.41
RDW: 22.8 — ABNORMAL HIGH
RDW: 23 — ABNORMAL HIGH
RDW: 23.8 — ABNORMAL HIGH
RDW: 23.9 — ABNORMAL HIGH
RDW: 24 — ABNORMAL HIGH
RDW: 24.1 — ABNORMAL HIGH
RDW: 24.8 — ABNORMAL HIGH
WBC: 10.9 — ABNORMAL HIGH
WBC: 11.8 — ABNORMAL HIGH
WBC: 12.7 — ABNORMAL HIGH
WBC: 12.9 — ABNORMAL HIGH
WBC: 14.4 — ABNORMAL HIGH

## 2010-12-03 LAB — RENAL FUNCTION PANEL
Albumin: 2 — ABNORMAL LOW
Albumin: 2 — ABNORMAL LOW
Albumin: 2.2 — ABNORMAL LOW
Albumin: 2.2 — ABNORMAL LOW
Albumin: 2.3 — ABNORMAL LOW
BUN: 14
BUN: 23
BUN: 27 — ABNORMAL HIGH
CO2: 22
Calcium: 8.8
Calcium: 9.4
Chloride: 102
Chloride: 99
Creatinine, Ser: 5.12 — ABNORMAL HIGH
Creatinine, Ser: 6.77 — ABNORMAL HIGH
Creatinine, Ser: 7.52 — ABNORMAL HIGH
Creatinine, Ser: 7.65 — ABNORMAL HIGH
GFR calc Af Amer: 13 — ABNORMAL LOW
GFR calc Af Amer: 7 — ABNORMAL LOW
GFR calc Af Amer: 7 — ABNORMAL LOW
GFR calc non Af Amer: 11 — ABNORMAL LOW
GFR calc non Af Amer: 5 — ABNORMAL LOW
GFR calc non Af Amer: 6 — ABNORMAL LOW
Glucose, Bld: 163 — ABNORMAL HIGH
Glucose, Bld: 225 — ABNORMAL HIGH
Phosphorus: 3
Phosphorus: 3.5
Phosphorus: 3.8
Phosphorus: 6.9 — ABNORMAL HIGH
Phosphorus: 7.7 — ABNORMAL HIGH
Potassium: 3.3 — ABNORMAL LOW
Potassium: 4.2
Sodium: 134 — ABNORMAL LOW
Sodium: 139
Sodium: 140

## 2010-12-03 LAB — I-STAT 8, (EC8 V) (CONVERTED LAB)
Acid-Base Excess: 8 — ABNORMAL HIGH
Chloride: 100
HCT: 50 — ABNORMAL HIGH
Operator id: 151321
Potassium: 3.2 — ABNORMAL LOW
Sodium: 138
pCO2, Ven: 45.9

## 2010-12-03 LAB — COMPREHENSIVE METABOLIC PANEL
ALT: 25
AST: 48 — ABNORMAL HIGH
Albumin: 1.9 — ABNORMAL LOW
CO2: 26
Calcium: 7.4 — ABNORMAL LOW
Creatinine, Ser: 4.34 — ABNORMAL HIGH
GFR calc Af Amer: 13 — ABNORMAL LOW
GFR calc non Af Amer: 11 — ABNORMAL LOW
Sodium: 144

## 2010-12-03 LAB — BLOOD GAS, ARTERIAL
Bicarbonate: 27.1 — ABNORMAL HIGH
TCO2: 28.7
pCO2 arterial: 50.4 — ABNORMAL HIGH
pH, Arterial: 7.35
pO2, Arterial: 90.6

## 2010-12-03 LAB — CROSSMATCH: ABO/RH(D): O POS

## 2010-12-03 LAB — BASIC METABOLIC PANEL
BUN: 18
BUN: 18
BUN: 20
Calcium: 10
Chloride: 101
Chloride: 101
Chloride: 101
Creatinine, Ser: 6.43 — ABNORMAL HIGH
Creatinine, Ser: 6.54 — ABNORMAL HIGH
GFR calc Af Amer: 8 — ABNORMAL LOW
GFR calc Af Amer: 9 — ABNORMAL LOW
GFR calc non Af Amer: 7 — ABNORMAL LOW
GFR calc non Af Amer: 8 — ABNORMAL LOW
Glucose, Bld: 200 — ABNORMAL HIGH
Potassium: 3.8
Potassium: 3.8
Sodium: 143

## 2010-12-03 LAB — PROTIME-INR
INR: 1.5
Prothrombin Time: 16.6 — ABNORMAL HIGH

## 2010-12-03 LAB — DIFFERENTIAL
Basophils Absolute: 0
Basophils Relative: 0
Eosinophils Relative: 0
Eosinophils Relative: 1
Lymphocytes Relative: 13
Lymphs Abs: 1.5
Lymphs Abs: 1.6
Lymphs Abs: 2.1
Monocytes Absolute: 1.1 — ABNORMAL HIGH
Monocytes Absolute: 1.1 — ABNORMAL HIGH
Monocytes Absolute: 1.3 — ABNORMAL HIGH
Monocytes Relative: 12 — ABNORMAL HIGH
Monocytes Relative: 9
Neutro Abs: 7.4
Neutro Abs: 9.8 — ABNORMAL HIGH

## 2010-12-03 LAB — CULTURE, BLOOD (ROUTINE X 2): Culture: NO GROWTH

## 2010-12-03 LAB — POCT I-STAT CREATININE: Operator id: 151321

## 2010-12-03 LAB — CORTISOL: Cortisol, Plasma: 20.5

## 2010-12-03 LAB — IRON AND TIBC: Iron: 20 — ABNORMAL LOW

## 2010-12-03 LAB — APTT: aPTT: 38 — ABNORMAL HIGH

## 2010-12-04 LAB — RENAL FUNCTION PANEL
CO2: 28
Glucose, Bld: 230 — ABNORMAL HIGH
Phosphorus: 5.4 — ABNORMAL HIGH
Potassium: 3.9
Sodium: 138

## 2010-12-04 LAB — CBC
MCV: 92.3
RBC: 4.66
WBC: 10.6 — ABNORMAL HIGH

## 2010-12-04 LAB — PROTIME-INR: Prothrombin Time: 29.9 — ABNORMAL HIGH

## 2010-12-05 LAB — CARDIAC PANEL(CRET KIN+CKTOT+MB+TROPI)
CK, MB: 1.7
CK, MB: 1.7
CK, MB: 2.1
Relative Index: INVALID
Relative Index: INVALID
Relative Index: INVALID
Total CK: 61
Total CK: 73
Total CK: 77
Troponin I: 1.42
Troponin I: 1.67
Troponin I: 1.7

## 2010-12-05 LAB — RENAL FUNCTION PANEL
Albumin: 3 — ABNORMAL LOW
Albumin: 3.3 — ABNORMAL LOW
Albumin: 2.8 — ABNORMAL LOW
Albumin: 3.1 — ABNORMAL LOW
BUN: 22
BUN: 45 — ABNORMAL HIGH
BUN: 50 — ABNORMAL HIGH
BUN: 60 — ABNORMAL HIGH
CO2: 29
CO2: 29
CO2: 27
CO2: 27
Calcium: 9.2
Calcium: 9.3
Calcium: 9.8
Calcium: 10.1
Calcium: 9
Chloride: 100
Chloride: 100
Chloride: 93 — ABNORMAL LOW
Chloride: 93 — ABNORMAL LOW
Chloride: 93 — ABNORMAL LOW
Creatinine, Ser: 5.47 — ABNORMAL HIGH
Creatinine, Ser: 8.12 — ABNORMAL HIGH
Creatinine, Ser: 9.87 — ABNORMAL HIGH
Creatinine, Ser: 10.51 — ABNORMAL HIGH
Creatinine, Ser: 8.06 — ABNORMAL HIGH
GFR calc Af Amer: 10 — ABNORMAL LOW
GFR calc Af Amer: 5 — ABNORMAL LOW
GFR calc Af Amer: 6 — ABNORMAL LOW
GFR calc Af Amer: 7 — ABNORMAL LOW
GFR calc Af Amer: 5 — ABNORMAL LOW
GFR calc Af Amer: 5 — ABNORMAL LOW
GFR calc non Af Amer: 4 — ABNORMAL LOW
GFR calc non Af Amer: 5 — ABNORMAL LOW
GFR calc non Af Amer: 5 — ABNORMAL LOW
GFR calc non Af Amer: 8 — ABNORMAL LOW
GFR calc non Af Amer: 4 — ABNORMAL LOW
GFR calc non Af Amer: 4 — ABNORMAL LOW
Glucose, Bld: 128 — ABNORMAL HIGH
Glucose, Bld: 165 — ABNORMAL HIGH
Glucose, Bld: 193 — ABNORMAL HIGH
Glucose, Bld: 145 — ABNORMAL HIGH
Phosphorus: 2.5
Phosphorus: 4
Phosphorus: 4.3
Phosphorus: 4.8 — ABNORMAL HIGH
Phosphorus: 6.1 — ABNORMAL HIGH
Potassium: 3.8
Potassium: 4.1
Potassium: 4
Potassium: 4.1
Sodium: 137
Sodium: 139
Sodium: 141

## 2010-12-05 LAB — I-STAT 8, (EC8 V) (CONVERTED LAB)
Acid-Base Excess: 5 — ABNORMAL HIGH
BUN: 18
BUN: 58 — ABNORMAL HIGH
Bicarbonate: 24.5 — ABNORMAL HIGH
Chloride: 100
HCT: 42
Hemoglobin: 14.3
Operator id: 234501
Potassium: 3.3 — ABNORMAL LOW
Sodium: 133 — ABNORMAL LOW
TCO2: 26
pCO2, Ven: 34.1 — ABNORMAL LOW
pCO2, Ven: 38.4 — ABNORMAL LOW
pH, Ven: 7.524 — ABNORMAL HIGH

## 2010-12-05 LAB — HEPATIC FUNCTION PANEL
ALT: 16
AST: 12
Albumin: 3.1 — ABNORMAL LOW
Alkaline Phosphatase: 59
Bilirubin, Direct: 0.2
Indirect Bilirubin: 0.8
Total Bilirubin: 1
Total Protein: 7.4

## 2010-12-05 LAB — DIFFERENTIAL
Basophils Absolute: 0
Basophils Absolute: 0
Basophils Relative: 0
Basophils Relative: 0
Eosinophils Absolute: 0.1
Eosinophils Relative: 1
Lymphocytes Relative: 13
Lymphocytes Relative: 10 — ABNORMAL LOW
Lymphs Abs: 2
Monocytes Absolute: 0.9 — ABNORMAL HIGH
Monocytes Absolute: 1.2 — ABNORMAL HIGH
Monocytes Relative: 11
Monocytes Relative: 8
Neutro Abs: 7.1
Neutro Abs: 8.8 — ABNORMAL HIGH
Neutrophils Relative %: 78 — ABNORMAL HIGH
Neutrophils Relative %: 70
Neutrophils Relative %: 81 — ABNORMAL HIGH

## 2010-12-05 LAB — PROTIME-INR
INR: 1.3
INR: 1.6 — ABNORMAL HIGH
INR: 1.8 — ABNORMAL HIGH
INR: 2 — ABNORMAL HIGH
INR: 2.3 — ABNORMAL HIGH
INR: 1.1
INR: 1.3
INR: 1.9 — ABNORMAL HIGH
INR: 2.3 — ABNORMAL HIGH
INR: 2.6 — ABNORMAL HIGH
INR: 2.8 — ABNORMAL HIGH
INR: 3.2 — ABNORMAL HIGH
Prothrombin Time: 16.5 — ABNORMAL HIGH
Prothrombin Time: 19.9 — ABNORMAL HIGH
Prothrombin Time: 21.5 — ABNORMAL HIGH
Prothrombin Time: 23.7 — ABNORMAL HIGH
Prothrombin Time: 26 — ABNORMAL HIGH
Prothrombin Time: 14.7
Prothrombin Time: 30.8 — ABNORMAL HIGH
Prothrombin Time: 33.4 — ABNORMAL HIGH
Prothrombin Time: 33.6 — ABNORMAL HIGH

## 2010-12-05 LAB — CBC
HCT: 33.7 — ABNORMAL LOW
HCT: 35.4 — ABNORMAL LOW
HCT: 34.9 — ABNORMAL LOW
HCT: 36.6
HCT: 37
HCT: 37.5
HCT: 38.1
Hemoglobin: 11.4 — ABNORMAL LOW
Hemoglobin: 12.2
Hemoglobin: 11.5 — ABNORMAL LOW
Hemoglobin: 11.6 — ABNORMAL LOW
Hemoglobin: 12.1
Hemoglobin: 12.3
MCHC: 32.6
MCHC: 32.2
MCHC: 32.3
MCHC: 32.6
MCHC: 32.9
MCV: 91.1
MCV: 92.2
MCV: 92.8
MCV: 91.7
MCV: 92
MCV: 92.1
MCV: 92.9
Platelets: 268
Platelets: 294
Platelets: 306
Platelets: 281
Platelets: 287
Platelets: 298
Platelets: 345
RBC: 3.82 — ABNORMAL LOW
RBC: 4.14
RBC: 3.81 — ABNORMAL LOW
RBC: 3.89
RBC: 4.02
RBC: 4.07
RBC: 4.1
RDW: 16.1 — ABNORMAL HIGH
RDW: 16.5 — ABNORMAL HIGH
RDW: 15.9 — ABNORMAL HIGH
RDW: 16 — ABNORMAL HIGH
RDW: 16.2 — ABNORMAL HIGH
RDW: 16.5 — ABNORMAL HIGH
WBC: 7.7
WBC: 8
WBC: 8.6
WBC: 9.1
WBC: 10.7 — ABNORMAL HIGH
WBC: 11 — ABNORMAL HIGH
WBC: 11.2 — ABNORMAL HIGH
WBC: 11.9 — ABNORMAL HIGH
WBC: 8.3

## 2010-12-05 LAB — POCT I-STAT 3, ART BLOOD GAS (G3+)
Acid-Base Excess: 1
Operator id: 281201
pH, Arterial: 7.501 — ABNORMAL HIGH

## 2010-12-05 LAB — COMPREHENSIVE METABOLIC PANEL
ALT: 13
AST: 13
Albumin: 3.3 — ABNORMAL LOW
Alkaline Phosphatase: 51
BUN: 29 — ABNORMAL HIGH
CO2: 22
CO2: 23
Calcium: 9.6
Chloride: 100
Creatinine, Ser: 9.61 — ABNORMAL HIGH
GFR calc Af Amer: 4 — ABNORMAL LOW
GFR calc non Af Amer: 4 — ABNORMAL LOW
GFR calc non Af Amer: 4 — ABNORMAL LOW
Glucose, Bld: 179 — ABNORMAL HIGH
Potassium: 4.3
Sodium: 137
Total Bilirubin: 1.3 — ABNORMAL HIGH
Total Protein: 7.4

## 2010-12-05 LAB — POCT CARDIAC MARKERS
CKMB, poc: 2.1
CKMB, poc: 2.7
Myoglobin, poc: >500
Myoglobin, poc: >500
Operator id: 234501
Operator id: 234501
Troponin i, poc: 0.87
Troponin i, poc: 0.93

## 2010-12-05 LAB — DIGOXIN LEVEL: Digoxin Level: 0.7 — ABNORMAL LOW

## 2010-12-05 LAB — BASIC METABOLIC PANEL
BUN: 19
CO2: 28
Calcium: 8.5
Creatinine, Ser: 4.9 — ABNORMAL HIGH
GFR calc non Af Amer: 9 — ABNORMAL LOW
Glucose, Bld: 184 — ABNORMAL HIGH
Sodium: 137

## 2010-12-05 LAB — BASIC METABOLIC PANEL WITH GFR
BUN: 34 — ABNORMAL HIGH
CO2: 27
Calcium: 9.7
Chloride: 100
Creatinine, Ser: 7.52 — ABNORMAL HIGH
GFR calc Af Amer: 7 — ABNORMAL LOW
GFR calc non Af Amer: 6 — ABNORMAL LOW
Glucose, Bld: 208 — ABNORMAL HIGH
Potassium: 4
Sodium: 140

## 2010-12-05 LAB — BLOOD GAS, ARTERIAL
Bicarbonate: 27.2 — ABNORMAL HIGH
TCO2: 28.2
pCO2 arterial: 34.3 — ABNORMAL LOW
pH, Arterial: 7.51 — ABNORMAL HIGH

## 2010-12-05 LAB — B-NATRIURETIC PEPTIDE (CONVERTED LAB): Pro B Natriuretic peptide (BNP): 1256 — ABNORMAL HIGH

## 2010-12-05 LAB — TSH: TSH: 0.303 — ABNORMAL LOW

## 2010-12-08 LAB — COMPREHENSIVE METABOLIC PANEL
ALT: 15
AST: 16
Albumin: 3.2 — ABNORMAL LOW
Calcium: 9.3
Chloride: 96
Creatinine, Ser: 7.78 — ABNORMAL HIGH
GFR calc Af Amer: 7 — ABNORMAL LOW
Sodium: 137
Total Bilirubin: 0.5

## 2010-12-09 LAB — CBC
HCT: 39.7
HCT: 39.9
HCT: 40.3
HCT: 42.3
Hemoglobin: 12.6
Hemoglobin: 12.8
Hemoglobin: 12.9
Hemoglobin: 12.9
MCHC: 31.9
MCV: 92.8
Platelets: 303
RBC: 4.39
RBC: 4.39
RBC: 4.56
RDW: 16.6 — ABNORMAL HIGH
RDW: 16.8 — ABNORMAL HIGH
RDW: 16.9 — ABNORMAL HIGH
WBC: 8.5
WBC: 9.3

## 2010-12-09 LAB — RENAL FUNCTION PANEL
BUN: 34 — ABNORMAL HIGH
BUN: 67 — ABNORMAL HIGH
BUN: 83 — ABNORMAL HIGH
CO2: 18 — ABNORMAL LOW
CO2: 26
CO2: 27
Chloride: 92 — ABNORMAL LOW
Creatinine, Ser: 9.63 — ABNORMAL HIGH
GFR calc Af Amer: 5 — ABNORMAL LOW
GFR calc non Af Amer: 4 — ABNORMAL LOW
Glucose, Bld: 106 — ABNORMAL HIGH
Glucose, Bld: 113 — ABNORMAL HIGH
Glucose, Bld: 200 — ABNORMAL HIGH
Phosphorus: 4.3
Phosphorus: 5.7 — ABNORMAL HIGH
Potassium: 4.3
Potassium: 4.7
Potassium: 4.8
Potassium: 5.2 — ABNORMAL HIGH
Sodium: 132 — ABNORMAL LOW

## 2010-12-09 LAB — CULTURE, BLOOD (ROUTINE X 2): Culture: NO GROWTH

## 2010-12-09 LAB — DIFFERENTIAL
Basophils Relative: 0
Lymphocytes Relative: 11 — ABNORMAL LOW
Lymphs Abs: 1.3
Monocytes Relative: 5
Neutro Abs: 9.7 — ABNORMAL HIGH
Neutrophils Relative %: 83 — ABNORMAL HIGH

## 2010-12-09 LAB — BLOOD GAS, ARTERIAL
Bicarbonate: 27.9 — ABNORMAL HIGH
O2 Saturation: 94.3
pH, Arterial: 7.427 — ABNORMAL HIGH
pO2, Arterial: 77.2 — ABNORMAL LOW

## 2010-12-09 LAB — COMPREHENSIVE METABOLIC PANEL
BUN: 55 — ABNORMAL HIGH
Calcium: 9.9
Creatinine, Ser: 10.78 — ABNORMAL HIGH
Glucose, Bld: 228 — ABNORMAL HIGH
Total Protein: 7.3

## 2010-12-09 LAB — PROTIME-INR: INR: 1.1

## 2012-02-11 ENCOUNTER — Encounter: Payer: Self-pay | Admitting: Vascular Surgery

## 2017-12-30 ENCOUNTER — Encounter: Payer: Self-pay | Admitting: Internal Medicine

## 2017-12-30 NOTE — Progress Notes (Signed)
A user error has taken place.

## 2018-04-24 ENCOUNTER — Encounter: Payer: Self-pay | Admitting: Internal Medicine

## 2018-04-24 DIAGNOSIS — Z0000 Encounter for general adult medical examination without abnormal findings: Secondary | ICD-10-CM

## 2018-04-24 NOTE — Progress Notes (Signed)
A user error has taken place This encounter was created in error - please disregard.
